# Patient Record
Sex: Male | Born: 1960
Health system: Southern US, Community
[De-identification: ages and names within clinical notes are randomized; demographics above are authoritative.]

## PROBLEM LIST (undated history)

## (undated) ENCOUNTER — Ambulatory Visit: Admission: EM | Payer: PRIVATE HEALTH INSURANCE | Source: Home / Self Care

## (undated) DIAGNOSIS — E7800 Pure hypercholesterolemia, unspecified: Secondary | ICD-10-CM

## (undated) DIAGNOSIS — E538 Deficiency of other specified B group vitamins: Principal | ICD-10-CM

## (undated) DIAGNOSIS — K219 Gastro-esophageal reflux disease without esophagitis: Secondary | ICD-10-CM

## (undated) DIAGNOSIS — H409 Unspecified glaucoma: Secondary | ICD-10-CM

## (undated) DIAGNOSIS — C801 Malignant (primary) neoplasm, unspecified: Secondary | ICD-10-CM

## (undated) DIAGNOSIS — K5790 Diverticulosis of intestine, part unspecified, without perforation or abscess without bleeding: Secondary | ICD-10-CM

## (undated) HISTORY — PX: COLONOSCOPY: SHX174

## (undated) HISTORY — DX: Diverticulosis of intestine, part unspecified, without perforation or abscess without bleeding: K57.90

## (undated) HISTORY — DX: Unspecified glaucoma: H40.9

## (undated) HISTORY — DX: Gastro-esophageal reflux disease without esophagitis: K21.9

## (undated) HISTORY — DX: Deficiency of other specified B group vitamins: E53.8

## (undated) HISTORY — PX: HERNIA REPAIR: SHX51

---

## 2004-03-14 ENCOUNTER — Ambulatory Visit: Payer: Self-pay | Admitting: Gastroenterology

## 2004-03-21 ENCOUNTER — Ambulatory Visit: Payer: Self-pay | Admitting: Internal Medicine

## 2004-04-20 ENCOUNTER — Ambulatory Visit: Payer: Self-pay | Admitting: Internal Medicine

## 2004-05-21 ENCOUNTER — Ambulatory Visit: Payer: Self-pay | Admitting: Internal Medicine

## 2004-06-21 ENCOUNTER — Ambulatory Visit: Payer: Self-pay | Admitting: Internal Medicine

## 2004-07-19 ENCOUNTER — Ambulatory Visit: Payer: Self-pay | Admitting: Internal Medicine

## 2004-08-19 ENCOUNTER — Ambulatory Visit: Payer: Self-pay | Admitting: Internal Medicine

## 2005-04-01 ENCOUNTER — Emergency Department: Payer: Self-pay | Admitting: Emergency Medicine

## 2005-04-11 ENCOUNTER — Ambulatory Visit: Payer: Self-pay | Admitting: Internal Medicine

## 2005-04-20 ENCOUNTER — Ambulatory Visit: Payer: Self-pay | Admitting: Internal Medicine

## 2006-03-27 ENCOUNTER — Other Ambulatory Visit: Payer: Self-pay

## 2006-03-27 ENCOUNTER — Emergency Department: Payer: Self-pay | Admitting: Emergency Medicine

## 2006-06-10 ENCOUNTER — Ambulatory Visit: Payer: Self-pay | Admitting: Internal Medicine

## 2006-06-21 ENCOUNTER — Ambulatory Visit: Payer: Self-pay | Admitting: Internal Medicine

## 2007-01-03 ENCOUNTER — Ambulatory Visit: Payer: Self-pay | Admitting: Internal Medicine

## 2007-01-20 ENCOUNTER — Ambulatory Visit: Payer: Self-pay | Admitting: Internal Medicine

## 2008-08-12 ENCOUNTER — Emergency Department: Payer: Self-pay | Admitting: Internal Medicine

## 2008-09-18 ENCOUNTER — Ambulatory Visit: Payer: Self-pay | Admitting: Internal Medicine

## 2008-10-12 ENCOUNTER — Ambulatory Visit: Payer: Self-pay | Admitting: Internal Medicine

## 2008-10-19 ENCOUNTER — Ambulatory Visit: Payer: Self-pay | Admitting: Internal Medicine

## 2009-11-22 ENCOUNTER — Ambulatory Visit: Payer: Self-pay | Admitting: Internal Medicine

## 2010-10-08 ENCOUNTER — Observation Stay: Payer: Self-pay | Admitting: Internal Medicine

## 2012-08-04 ENCOUNTER — Emergency Department: Payer: Self-pay | Admitting: Emergency Medicine

## 2012-08-04 LAB — URINALYSIS, COMPLETE
Bilirubin,UR: NEGATIVE
Blood: NEGATIVE
Glucose,UR: NEGATIVE mg/dL (ref 0–75)
Ketone: NEGATIVE
Leukocyte Esterase: NEGATIVE
Nitrite: NEGATIVE
Protein: NEGATIVE
RBC,UR: 1 /HPF (ref 0–5)
Specific Gravity: 1.045 (ref 1.003–1.030)
Squamous Epithelial: <1

## 2012-08-04 LAB — COMPREHENSIVE METABOLIC PANEL
Albumin: 4.2 g/dL (ref 3.4–5.0)
BUN: 12 mg/dL (ref 7–18)
Bilirubin,Total: 0.4 mg/dL (ref 0.2–1.0)
Calcium, Total: 8.9 mg/dL (ref 8.5–10.1)
Co2: 32 mmol/L (ref 21–32)

## 2012-08-04 LAB — CBC
HCT: 51.1 % (ref 40.0–52.0)
HGB: 17.4 g/dL (ref 13.0–18.0)
MCH: 30 pg (ref 26.0–34.0)
MCHC: 34 g/dL (ref 32.0–36.0)
Platelet: 82 10*3/uL — ABNORMAL LOW (ref 150–440)
RBC: 5.79 10*6/uL (ref 4.40–5.90)
WBC: 9.5 10*3/uL (ref 3.8–10.6)

## 2012-08-04 LAB — LIPASE, BLOOD: Lipase: 227 U/L (ref 73–393)

## 2013-01-19 ENCOUNTER — Ambulatory Visit: Payer: Self-pay

## 2013-04-23 ENCOUNTER — Observation Stay: Payer: Self-pay | Admitting: Family Medicine

## 2013-04-23 LAB — URINALYSIS, COMPLETE
Bilirubin,UR: NEGATIVE
Hyaline Cast: 25
Ketone: NEGATIVE
Leukocyte Esterase: NEGATIVE
Nitrite: NEGATIVE
Protein: 100
RBC,UR: 10 /HPF (ref 0–5)
Squamous Epithelial: 1

## 2013-04-23 LAB — CBC WITH DIFFERENTIAL/PLATELET
Eosinophil #: 0.1 10*3/uL (ref 0.0–0.7)
Eosinophil %: 0.9 %
HCT: 52.1 % — ABNORMAL HIGH (ref 40.0–52.0)
HGB: 17.4 g/dL (ref 13.0–18.0)
MCH: 29.6 pg (ref 26.0–34.0)
MCHC: 33.5 g/dL (ref 32.0–36.0)
MCV: 88 fL (ref 80–100)
Monocyte %: 6.6 %
Neutrophil #: 6.6 10*3/uL — ABNORMAL HIGH (ref 1.4–6.5)
Platelet: 98 10*3/uL — ABNORMAL LOW (ref 150–440)
RDW: 15.3 % — ABNORMAL HIGH (ref 11.5–14.5)
WBC: 8.7 10*3/uL (ref 3.8–10.6)

## 2013-04-23 LAB — COMPREHENSIVE METABOLIC PANEL
Alkaline Phosphatase: 120 U/L — ABNORMAL HIGH
BUN: 28 mg/dL — ABNORMAL HIGH (ref 7–18)
Creatinine: 1.32 mg/dL — ABNORMAL HIGH (ref 0.60–1.30)
EGFR (African American): >60
EGFR (Non-African Amer.): >60
Glucose: 121 mg/dL — ABNORMAL HIGH (ref 65–99)
Osmolality: 275 (ref 275–301)
SGOT(AST): 48 U/L — ABNORMAL HIGH (ref 15–37)
SGPT (ALT): 48 U/L (ref 12–78)
Total Protein: 8.8 g/dL — ABNORMAL HIGH (ref 6.4–8.2)

## 2013-04-23 LAB — MAGNESIUM: Magnesium: 2.1 mg/dL

## 2013-04-24 LAB — COMPREHENSIVE METABOLIC PANEL
Alkaline Phosphatase: 104 U/L
Anion Gap: 5 — ABNORMAL LOW (ref 7–16)
BUN: 14 mg/dL (ref 7–18)
Bilirubin,Total: 0.3 mg/dL (ref 0.2–1.0)
EGFR (Non-African Amer.): >60
Osmolality: 281 (ref 275–301)

## 2013-04-25 LAB — PLATELET COUNT: Platelet: 64 10*3/uL — ABNORMAL LOW (ref 150–440)

## 2013-04-25 LAB — WBCS, STOOL

## 2013-04-26 LAB — STOOL CULTURE

## 2013-06-08 ENCOUNTER — Ambulatory Visit: Payer: Self-pay | Admitting: Gastroenterology

## 2013-06-10 LAB — PATHOLOGY REPORT

## 2014-09-10 NOTE — Discharge Summary (Signed)
PATIENT NAME:  Donald Mendoza, Donald Mendoza MR#:  [id number] DATE OF BIRTH:  1960/08/25  DATE OF ADMISSION:  04/23/2013 DATE OF DISCHARGE:  04/25/2013  REASON FOR ADMISSION:  Significant diarrhea.   DISCHARGE DIAGNOSES: 1.  Acute kidney injury secondary to dehydration, intravascular volume depletion.  2.  Acute diarrhea secondary to viral gastroenteritis.  3.  History of idiopathic thrombocytopenia purpura, stable.  The patient has platelets at discharge of 60,000.  4.  Tobacco use disorder.  The patient has been counseled at discharge about his tobacco use and he understands that he needs to quit.  Tobacco cessation counseling given for at least four minutes.  5.  The patient has been admitted with a history of diarrhea.  Apparently he has a sick contact of several members of family having diarrhea.  His diarrhea started two days prior to the admission, numerous watery stool episodes, sometimes too high to count.  He has associated abdominal pain and cramping of the left lower quadrant without any radiation.  The patient had no significant relief for what he went to see his primary care physician.  At the primary care physician's office the patient was significantly orthostatic with persistent symptoms for what he was sent to the ER.  In the ER, his blood pressure was 114/81.  He was tachycardic at 106 with a temperature of 98.3.  He looks dehydrated and his creatinine was 1.32, which is high from his baseline.   The patient was admitted with the following diagnoses:   1.  Acute kidney injury.  The patient had acute kidney injury due to intravascular volume depletion.  He was put on IV fluids and he had a significant recovery and reversal of the kidney injury back to baseline.  This is likely again secondary to his diarrhea and significant dehydration.  2.  As far as his diarrhea, the patient had positive sick contacts, likely viral.  He had a CT scan.  The CT scan did not show any major abnormalities other than  bowel distention that could be just due to fluid from the diarrhea.  He had multiple testing done including C. diff. which was negative.  There was no red blood cells in the stool.  There was no white blood cells in the stool.  Stool culture showed no pathogenic E. coli.  No Helicobacter.  His urinalysis had 11 white blood cells, 10 red blood cells, but the patient was mostly symptomatic, for what he was just treated symptomatically for his diarrhea.    The patient did not have any significant fever.  At discharge he is discharged on pravastatin 40 mg once a day, loperamide 2 mg 4 times a day and he had a CBC and a BMP scheduled to be done and sent to Dr. Ma Hillock.    Overall, the patient had a good recovery.  The only concern was that at discharge his platelets were a little bit low.  We expect with viral infections to have a little bit more consumption of platelets.  At this moment we are at 64,000 and initial were 98,000.  The fact that admission were 98,000 could be due to hemoconcentration since his hemoglobin was 17 and his hematocrit was 52, so very likely the patient was just really hemoconcentrated.  We recommended to get a BMP in the next couple of days and a CBC in the next couple of days and those results are going to be sent to Dr. Ma Hillock since he has seen before due to idiopathic  thrombocytopenia purpura.   There is no signs of bleeding.  His idiopathic thrombocytopenia purpura has been stable.   I spent about 45 minutes with this discharge.      ____________________________ Sharp Sink, MD rsg:ea D: 04/26/2013 06:50:19 ET T: 04/26/2013 07:12:54 ET JOB#: [id number]  cc: Lindsay Sink, MD, <Dictator> Sandeep R. Ma Hillock, MD [place], NP Roselie Awkward America Brown MD ELECTRONICALLY SIGNED 04/30/2013 18:10

## 2014-09-10 NOTE — H&P (Signed)
PATIENT NAME:  Donald Mendoza, Donald Mendoza MR#:  [id number] DATE OF BIRTH:  1961/03/31  DATE OF ADMISSION:  04/23/2013  REFERRING PHYSICIAN: Dr. Jasmine December.   PRIMARY CARE PHYSICIAN: Cinda Quest, NP.   CHIEF COMPLAINT: Diarrhea.   HISTORY OF PRESENT ILLNESS: This is a 54 year old African American gentleman with past medical history of hyperlipidemia who is presenting with diarrhea. States that he has been having diarrhea for 2 days in duration. Describes this as numerous watery stools too high to count with associated abdominal cramping located in the left lower quadrant without radiation, 3 to 4 out of 10 in intensity. No worsening or relieving factors. Denies any associated nausea or vomiting; however, positive for decreased p.o. intake. He states that he has also been having intermittent bilateral leg cramping sensation which he finds relief with walking. He was seen by his PCP. Found to be orthostatic with vital signs. Given persistent symptoms, presented to [facility] for further workup and evaluation. Currently, he is without complaints.   REVIEW OF SYSTEMS:   CONSTITUTIONAL: Positive for subjective fevers, chills, generalized weakness and fatigue.  EYES: Denies blurred vision, double vision, eye pain.  EARS, NOSE, THROAT: Denies tinnitus, ear pain, hearing loss.  RESPIRATORY: Denies cough, wheeze, shortness of breath.  CARDIOVASCULAR: Denies chest pain, palpitations, edema.  GASTROINTESTINAL: Positive for diarrhea and abdominal pain as described above. Denies any nausea or vomiting.  GENITOURINARY: Denies dysuria, hematuria.  ENDOCRINE: Denies any nocturia or polyuria.  HEMATOLOGIC AND LYMPHATIC: Denies any bruising or bleeding.  SKIN: Denies rashes or lesions.  MUSCULOSKELETAL: Denies pain in neck, back, shoulder, knees, hips, any arthritic symptoms.  NEUROLOGIC: Denies any paralysis, paresthesias.  PSYCHIATRIC: Denies any anxiety or depressive symptoms.   Otherwise, full review of  systems performed by me is negative.   PAST MEDICAL HISTORY: ITP and hyperlipidemia.   SOCIAL HISTORY: Positive for tobacco use. Denies any alcohol or drug usage.   FAMILY HISTORY: Positive for hypertension and diabetes.   ALLERGIES: No known drug allergies.   HOME MEDICATIONS: Include pravastatin 40 mg p.o. at bedtime.   PHYSICAL EXAMINATION:  VITAL SIGNS: Temperature 98.3, heart rate 106, respirations 22, blood pressure 114/81, saturating 98% on room air. Weight 74.8 kg, BMI 25.9.  GENERAL: Well-nourished, well-developed, African American gentleman who is currently in no acute distress.  HEAD: Normocephalic, atraumatic.  EYES: Pupils equal, round and reactive to light. Extraocular muscles intact. No scleral icterus.  MOUTH: Dry mucosal membranes. Dentition intact. No abscess noted.  EARS, NOSE, THROAT: Throat clear without exudates. No external lesions.  NECK: Supple. No thyromegaly. No nodules. No JVD.  PULMONARY: Clear to auscultation bilaterally. No wheezes, rales or rhonchi. No use of accessory muscles. Good respiratory effort.  CHEST: Nontender to palpation.  CARDIOVASCULAR: S1, S2, regular rate and rhythm. No murmurs, rubs or gallops. No edema. Pedal pulses 2+ bilaterally.  GASTROINTESTINAL: Soft, nontender, nondistended. No masses. Hyperactive bowel sounds. No hepatosplenomegaly.  MUSCULOSKELETAL: No swelling, clubbing or edema. Range of motion full in all extremities.  NEUROLOGIC: Cranial nerves II through XII intact. No gross focal neurological deficits. Sensation intact. Reflexes intact.  SKIN: No ulcerations, lesions, rash or cyanosis. Skin warm and dry. Turgor is intact.  PSYCHIATRIC: Mood and affect within normal limits. The patient is awake, alert, oriented x 3. Insight and judgment are intact.   LABORATORY DATA: Sodium 134, potassium 4.6, chloride 107, bicarb 18, anion gap of 9, BUN 28, creatinine 1.32, glucose 121. LFTs: Total protein 8.8, alk phos 120, AST 48, ALT 48.  Remainder  of LFTs within normal limits. WBC 8.7, hemoglobin 17.4, platelets 98. Urinalysis negative for evidence of infection. CT abdomen and pelvis performed, revealing marked gaseous distention which was present on prior CTs but appears slightly worse, as well as small bowel distention slightly worse when compared to prior CTs; however, no focal transition point or site of obstruction is noted.   ASSESSMENT AND PLAN: A 54 year old [place] American gentleman with history of hyperlipidemia presenting with diarrhea. Found to have acute kidney injury.  1. Acute kidney injury: Likely prerenal in nature given his history of gastrointestinal losses. Will provide intravenous fluid hydration and follow his renal function.  2. Diarrhea: Denies history of positive sick contacts and lack of CT findings. Clostridium difficile has been sent. Will check fecal leukocytes. If those are normal, can start with Imodium for symptomatic treatment.  3. Hyponatremia at 134: Provide intravenous fluid hydration with normal saline and follow sodium levels.  4. Elevated LFTs with no clear etiology: Will follow trend. CT was within normal limits.  5. Metabolic acidosis without an elevated anion gap: Likely secondary to diarrhea. Provide intravenous fluid hydration and follow trends.  6. Venous thromboembolism prophylaxis with heparin subcutaneous.   The patient is FULL CODE.   TIME SPENT: 45 minutes.   ____________________________ Aaron Mose. Hower, MD dkh:gb D: 04/23/2013 22:32:12 ET T: 04/23/2013 22:45:00 ET JOB#: [id number]  cc: Aaron Mose. Hower, MD, <Dictator> DAVID Woodfin Ganja MD ELECTRONICALLY SIGNED 04/24/2013 2:39

## 2015-04-15 ENCOUNTER — Emergency Department: Payer: BLUE CROSS/BLUE SHIELD

## 2015-04-15 ENCOUNTER — Encounter: Payer: Self-pay | Admitting: Emergency Medicine

## 2015-04-15 ENCOUNTER — Emergency Department
Admission: EM | Admit: 2015-04-15 | Discharge: 2015-04-15 | Disposition: A | Discharge status: Home / Self Care | Payer: BLUE CROSS/BLUE SHIELD | Attending: Emergency Medicine | Admitting: Emergency Medicine

## 2015-04-15 DIAGNOSIS — Y9289 Other specified places as the place of occurrence of the external cause: Secondary | ICD-10-CM | POA: Diagnosis not present

## 2015-04-15 DIAGNOSIS — Y9389 Activity, other specified: Secondary | ICD-10-CM | POA: Diagnosis not present

## 2015-04-15 DIAGNOSIS — X58XXXA Exposure to other specified factors, initial encounter: Secondary | ICD-10-CM | POA: Insufficient documentation

## 2015-04-15 DIAGNOSIS — R1110 Vomiting, unspecified: Secondary | ICD-10-CM | POA: Diagnosis not present

## 2015-04-15 DIAGNOSIS — Y998 Other external cause status: Secondary | ICD-10-CM | POA: Insufficient documentation

## 2015-04-15 DIAGNOSIS — R109 Unspecified abdominal pain: Secondary | ICD-10-CM | POA: Diagnosis not present

## 2015-04-15 DIAGNOSIS — T188XXA Foreign body in other parts of alimentary tract, initial encounter: Secondary | ICD-10-CM | POA: Diagnosis present

## 2015-04-15 DIAGNOSIS — R079 Chest pain, unspecified: Secondary | ICD-10-CM | POA: Diagnosis not present

## 2015-04-15 HISTORY — DX: Pure hypercholesterolemia, unspecified: E78.00

## 2015-04-15 LAB — CBC
HEMATOCRIT: 49.5 % (ref 40.0–52.0)
HEMOGLOBIN: 16.2 g/dL (ref 13.0–18.0)
MCH: 29.3 pg (ref 26.0–34.0)
MCHC: 32.8 g/dL (ref 32.0–36.0)
MCV: 89.5 fL (ref 80.0–100.0)
Platelets: UNDETERMINED 10*3/uL (ref 150–440)
RBC: 5.53 MIL/uL (ref 4.40–5.90)
RDW: 14.8 % — AB (ref 11.5–14.5)
WBC: 10.4 10*3/uL (ref 3.8–10.6)

## 2015-04-15 LAB — BASIC METABOLIC PANEL
ANION GAP: 5 (ref 5–15)
BUN: 10 mg/dL (ref 6–20)
CHLORIDE: 107 mmol/L (ref 101–111)
CO2: 28 mmol/L (ref 22–32)
Calcium: 9.4 mg/dL (ref 8.9–10.3)
Creatinine, Ser: 0.84 mg/dL (ref 0.61–1.24)
Glucose, Bld: 102 mg/dL — ABNORMAL HIGH (ref 65–99)
POTASSIUM: 3.9 mmol/L (ref 3.5–5.1)
SODIUM: 140 mmol/L (ref 135–145)

## 2015-04-15 LAB — TROPONIN I: Troponin I: <0.03 ng/mL (ref <0.031)

## 2015-04-15 MED ORDER — GLUCAGON HCL (RDNA) 1 MG IJ SOLR
1.0000 mg | Freq: Once | INTRAMUSCULAR | Status: AC
  Administered 2015-04-15: 1 mg via INTRAMUSCULAR
  Filled 2015-04-15 (×2): qty 1

## 2015-04-15 MED ORDER — PANTOPRAZOLE SODIUM 40 MG PO TBEC: 40.0000 mg | DELAYED_RELEASE_TABLET | Freq: Every day | ORAL | Status: DC

## 2015-04-15 MED ORDER — GI COCKTAIL ~~LOC~~
30.0000 mL | Freq: Once | ORAL | Status: AC
  Administered 2015-04-15: 30 mL via ORAL
  Filled 2015-04-15: qty 30

## 2015-04-15 MED ORDER — SUCRALFATE 1 G PO TABS: 1.0000 g | ORAL_TABLET | Freq: Four times a day (QID) | ORAL | Status: DC

## 2015-04-15 NOTE — ED Notes (Signed)
Patient transported to X-ray 

## 2015-04-15 NOTE — ED Provider Notes (Signed)
[facility] Emergency Department Provider Note   ____________________________________________  Time seen: 1010  I have reviewed the triage vital signs and the nursing notes.   HISTORY  Chief Complaint Swallowed Foreign Body   History limited by: Not Limited   HPI Donald Mendoza is a 54 y.o. male who presents to the emergency department with concerns for chest pain. The patient states that it started yesterday when he had a piece of food caught in his throat. He states that for a couple hours last night he was not able to pass food or liquid through throat. He then had some vomiting which she thinks dislodge the piece of food. Since that time he has been able to swallow liquids. He still continues to have pain. He did not have any shortness of breath. Denies any cough. Denies any fevers. Denies any post with his esophagus or throat in the past.  Past Medical History  Diagnosis Date  . Hypercholesteremia     There are no active problems to display for this patient.   No past surgical history on file.  No current outpatient prescriptions on file.  Allergies Sulfa antibiotics  No family history on file.  Social History Social History  Substance Use Topics  . Smoking status: None  . Smokeless tobacco: None  . Alcohol Use: None    Review of Systems  Constitutional: Negative for fever. Cardiovascular: Positive for chest pain Respiratory: Negative for shortness of breath. Gastrointestinal: Negative for abdominal pain, vomiting and diarrhea. Genitourinary: Negative for dysuria. Musculoskeletal: Negative for back pain. Skin: Negative for rash. Neurological: Negative for headaches, focal weakness or numbness.  10-point ROS otherwise negative.  ____________________________________________   PHYSICAL EXAM:  VITAL SIGNS: ED Triage Vitals  Enc Vitals Group     BP 04/15/15 0944 114/73 mmHg     Pulse Rate 04/15/15 0944 78     Resp 04/15/15  0944 16     Temp 04/15/15 0944 97.9 F (36.6 C)     Temp Source 04/15/15 0944 Oral     SpO2 04/15/15 0944 100 %     Weight --      Height --      Head Cir --      Peak Flow --      Pain Score 04/15/15 0940 5   Constitutional: Alert and oriented. Well appearing and in no distress. Eyes: Conjunctivae are normal. PERRL. Normal extraocular movements. ENT   Head: Normocephalic and atraumatic.   Nose: No congestion/rhinnorhea.   Mouth/Throat: Mucous membranes are moist.   Neck: No stridor. Hematological/Lymphatic/Immunilogical: No cervical lymphadenopathy. Cardiovascular: Normal rate, regular rhythm.  No murmurs, rubs, or gallops. Respiratory: Normal respiratory effort without tachypnea nor retractions. Breath sounds are clear and equal bilaterally. No wheezes/rales/rhonchi. Gastrointestinal: Soft and nontender. No distention.  Genitourinary: Deferred Musculoskeletal: Normal range of motion in all extremities. No joint effusions.   Neurologic:  Normal speech and language. No gross focal neurologic deficits are appreciated.  Skin:  Skin is warm, dry and intact. No rash noted. Psychiatric: Mood and affect are normal. Speech and behavior are normal. Patient exhibits appropriate insight and judgment.  ____________________________________________    LABS (pertinent positives/negatives)  Labs Reviewed  BASIC METABOLIC PANEL - Abnormal; Notable for the following:    Glucose, Bld 102 (*)    All other components within normal limits  CBC - Abnormal; Notable for the following:    RDW 14.8 (*)    All other components within normal limits  TROPONIN I  ____________________________________________   EKG  Apolonio Schneiders, attending physician, personally viewed and interpreted this EKG  EKG Time: 0933 Rate: 75 Rhythm: NSR Axis: left axis deviation Intervals: qtc 428 QRS: narrow, q waves V1, V2 ST changes: no st elevation Impression: abnormal  ekg ____________________________________________    RADIOLOGY  CXR  IMPRESSION: No active cardiopulmonary disease.  ____________________________________________   PROCEDURES  Procedure(s) performed: None  Critical Care performed: No  ____________________________________________   INITIAL IMPRESSION / ASSESSMENT AND PLAN / ED COURSE  Pertinent labs & imaging results that were available during my care of the patient were reviewed by me and considered in my medical decision making (see chart for details).  Patient presented to the emergency department today with continued chest pain after getting a piece of food stuck in his throat for a number of hours last night. On exam patient here appears well. Physical exam is benign. Did write for patient to get a GI cocktail which improved the patient's pain. Patient stated he felt better at time of discharge. I instructed patient to follow-up with GI if pain got worse.  ____________________________________________   FINAL CLINICAL IMPRESSION(S) / ED DIAGNOSES  Final diagnoses:  Abdominal pain, unspecified abdominal location     Nance Pear, MD 04/15/15 [phone]

## 2015-04-15 NOTE — ED Notes (Signed)
Says he was eating yesterday and food got stuck.  Says he was unable to keep water down after , and it came back each time.  Has been up all night having pain in chest--spasms.  Says he tried a little water this am and it went down, but the pain in chest spasms keep coming and going.

## 2015-04-15 NOTE — Discharge Instructions (Signed)
Please seek medical attention for any high fevers, chest pain, shortness of breath, change in behavior, persistent vomiting, bloody stool or any other new or concerning symptoms. ° ° °Abdominal Pain, Adult °Many things can cause abdominal pain. Usually, abdominal pain is not caused by a disease and will improve without treatment. It can often be observed and treated at home. Your health care provider will do a physical exam and possibly order blood tests and X-rays to help determine the seriousness of your pain. However, in many cases, more time must pass before a clear cause of the pain can be found. Before that point, your health care provider may not know if you need more testing or further treatment. °HOME CARE INSTRUCTIONS °Monitor your abdominal pain for any changes. The following actions may help to alleviate any discomfort you are experiencing: °· Only take over-the-counter or prescription medicines as directed by your health care provider. °· Do not take laxatives unless directed to do so by your health care provider. °· Try a clear liquid diet (broth, tea, or water) as directed by your health care provider. Slowly move to a bland diet as tolerated. °SEEK MEDICAL CARE IF: °· You have unexplained abdominal pain. °· You have abdominal pain associated with nausea or diarrhea. °· You have pain when you urinate or have a bowel movement. °· You experience abdominal pain that wakes you in the night. °· You have abdominal pain that is worsened or improved by eating food. °· You have abdominal pain that is worsened with eating fatty foods. °· You have a fever. °SEEK IMMEDIATE MEDICAL CARE IF: °· Your pain does not go away within 2 hours. °· You keep throwing up (vomiting). °· Your pain is felt only in portions of the abdomen, such as the right side or the left lower portion of the abdomen. °· You pass bloody or black tarry stools. °MAKE SURE YOU: °· Understand these instructions. °· Will watch your  condition. °· Will get help right away if you are not doing well or get worse. °  °This information is not intended to replace advice given to you by your health care provider. Make sure you discuss any questions you have with your health care provider. °  °Document Released: 02/14/2005 Document Revised: 01/26/2015 Document Reviewed: 01/14/2013 °Elsevier Interactive Patient Education ©2016 Elsevier Inc[employer] ° °

## 2015-09-05 ENCOUNTER — Other Ambulatory Visit: Payer: Self-pay | Admitting: Physician Assistant

## 2015-09-05 DIAGNOSIS — Z125 Encounter for screening for malignant neoplasm of prostate: Secondary | ICD-10-CM | POA: Diagnosis not present

## 2015-09-05 DIAGNOSIS — R1319 Other dysphagia: Secondary | ICD-10-CM

## 2015-09-05 DIAGNOSIS — R1310 Dysphagia, unspecified: Secondary | ICD-10-CM

## 2015-09-05 DIAGNOSIS — E118 Type 2 diabetes mellitus with unspecified complications: Secondary | ICD-10-CM | POA: Diagnosis not present

## 2015-09-05 DIAGNOSIS — Z0001 Encounter for general adult medical examination with abnormal findings: Secondary | ICD-10-CM | POA: Diagnosis not present

## 2015-09-05 DIAGNOSIS — D696 Thrombocytopenia, unspecified: Secondary | ICD-10-CM | POA: Diagnosis not present

## 2015-09-05 DIAGNOSIS — R1314 Dysphagia, pharyngoesophageal phase: Secondary | ICD-10-CM | POA: Diagnosis not present

## 2015-09-13 ENCOUNTER — Ambulatory Visit: Payer: BLUE CROSS/BLUE SHIELD | Attending: Physician Assistant

## 2015-09-13 ENCOUNTER — Other Ambulatory Visit: Payer: BLUE CROSS/BLUE SHIELD

## 2015-09-22 DIAGNOSIS — R1310 Dysphagia, unspecified: Secondary | ICD-10-CM | POA: Diagnosis not present

## 2015-09-22 DIAGNOSIS — K219 Gastro-esophageal reflux disease without esophagitis: Secondary | ICD-10-CM | POA: Diagnosis not present

## 2015-10-18 DIAGNOSIS — K2960 Other gastritis without bleeding: Secondary | ICD-10-CM | POA: Diagnosis not present

## 2015-10-18 DIAGNOSIS — K2990 Gastroduodenitis, unspecified, without bleeding: Secondary | ICD-10-CM | POA: Diagnosis not present

## 2015-10-18 DIAGNOSIS — R1310 Dysphagia, unspecified: Secondary | ICD-10-CM | POA: Diagnosis not present

## 2015-10-18 DIAGNOSIS — K2970 Gastritis, unspecified, without bleeding: Secondary | ICD-10-CM | POA: Diagnosis not present

## 2015-10-18 DIAGNOSIS — K222 Esophageal obstruction: Secondary | ICD-10-CM | POA: Diagnosis not present

## 2016-01-04 DIAGNOSIS — H401123 Primary open-angle glaucoma, left eye, severe stage: Secondary | ICD-10-CM | POA: Diagnosis not present

## 2016-01-08 DIAGNOSIS — Z23 Encounter for immunization: Secondary | ICD-10-CM | POA: Diagnosis not present

## 2016-02-15 ENCOUNTER — Encounter: Payer: Self-pay | Admitting: General Surgery

## 2016-02-15 DIAGNOSIS — K429 Umbilical hernia without obstruction or gangrene: Secondary | ICD-10-CM | POA: Diagnosis not present

## 2016-02-15 DIAGNOSIS — K5752 Diverticulitis of both small and large intestine without perforation or abscess without bleeding: Secondary | ICD-10-CM | POA: Diagnosis not present

## 2016-02-27 ENCOUNTER — Encounter: Payer: Self-pay | Admitting: General Surgery

## 2016-02-27 ENCOUNTER — Ambulatory Visit (INDEPENDENT_AMBULATORY_CARE_PROVIDER_SITE_OTHER): Payer: BLUE CROSS/BLUE SHIELD | Admitting: General Surgery

## 2016-02-27 VITALS — BP 140/76 | HR 76 | Resp 12 | Ht 67.0 in | Wt 171.0 lb

## 2016-02-27 DIAGNOSIS — K429 Umbilical hernia without obstruction or gangrene: Secondary | ICD-10-CM

## 2016-02-27 DIAGNOSIS — R1033 Periumbilical pain: Secondary | ICD-10-CM | POA: Diagnosis not present

## 2016-02-27 NOTE — Progress Notes (Signed)
Patient ID: Donald Mendoza, male   DOB: 02-Jan-1961, 55 y.o.   MRN: [id number]  Chief Complaint  Patient presents with  . Other    hernia    HPI Donald Mendoza is a 55 y.o. male here today for an evaluation a hernia. He states he first noticed this about 3 months ago. Reports lifting something at work then developing severe right-sided abdominal pain. He was evaluated for this, but no imaging was completed. It does bulge in and out. He states he was had this hernia repaired 10 years ago at [facility], he does not remember who did the surgery.   I have reviewed the history of present illness with the patient.  HPI  Past Medical History:  Diagnosis Date  . GERD (gastroesophageal reflux disease)   . Hypercholesteremia     Past Surgical History:  Procedure Laterality Date  . HERNIA REPAIR     10 years ago, [facility]    Family History  Problem Relation Age of Onset  . Cancer Mother     Breast  . Diabetes Mother   . Cancer Sister     Breast     Social History Social History  Substance Use Topics  . Smoking status: Current Every Day Smoker    Packs/day: 0.50    Years: 10.00    Types: Cigarettes  . Smokeless tobacco: Never Used  . Alcohol use Yes    Allergies  Allergen Reactions  . Sulfa Antibiotics     Current Outpatient Prescriptions  Medication Sig Dispense Refill  . latanoprost (XALATAN) 0.005 % ophthalmic solution   0  . omeprazole (PRILOSEC) 40 MG capsule   0  . pantoprazole (PROTONIX) 40 MG tablet Take 1 tablet (40 mg total) by mouth daily. 30 tablet 1   No current facility-administered medications for this visit.     Review of Systems Review of Systems  Constitutional: Negative.   Respiratory: Negative.   Cardiovascular: Negative.   Gastrointestinal: Positive for abdominal pain. Negative for abdominal distention, blood in stool, constipation, diarrhea, nausea and vomiting.    Blood pressure 140/76, pulse 76, resp. rate 12, height 5' 7"  (1.702 m), weight 171  lb (77.6 kg).  Physical Exam Physical Exam  Constitutional: He is oriented to person, place, and time. He appears well-developed and well-nourished.  Eyes: Conjunctivae are normal. No scleral icterus.  Neck: Neck supple.  Cardiovascular: Normal rate, regular rhythm and normal heart sounds.   Pulmonary/Chest: Effort normal and breath sounds normal.  Abdominal: Soft. Bowel sounds are normal. He exhibits no distension and no mass. There is no tenderness.  No palpable hernia  Neurological: He is alert and oriented to person, place, and time.  Skin: Skin is warm and dry.    Data Reviewed Notes  Assessment    No palpable hernia on exam, however patient reports on and off severe pain where he states prior hernia repair was done.    Plan    Obtain CT scan abdomen to assess for hernia or other explanations for the patient's pain.  The patient is scheduled for a CT abdomen and pelvis on 03/02/16 at 3:30 pm. He will arrive by 3:15 pm and have nothing to eat or drink for 4 hours prior. He will pick up a prep kit today. The patient is aware of date, time, and instructions. He will follow up here on 03/06/16 at 4:15 pm.  This information has been scribed by Verlene Mayer, CMA  Jessicia Napolitano G 02/27/2016, 4:06 PM

## 2016-02-27 NOTE — Patient Instructions (Addendum)
Follow up after CT scan abdomen.  The patient is scheduled for a CT abdomen and pelvis on 03/02/16 at 3:30 pm. He will arrive by 3:15 pm and have nothing to eat or drink for 4 hours prior. He will pick up a prep kit today. The patient is aware of date, time, and instructions. He will follow up here on 03/06/16 at 4:15 pm.

## 2016-03-02 ENCOUNTER — Ambulatory Visit
Admission: RE | Admit: 2016-03-02 | Discharge: 2016-03-02 | Disposition: A | Discharge status: Home / Self Care | Payer: BLUE CROSS/BLUE SHIELD | Source: Ambulatory Visit | Attending: General Surgery | Admitting: General Surgery

## 2016-03-02 DIAGNOSIS — K4090 Unilateral inguinal hernia, without obstruction or gangrene, not specified as recurrent: Secondary | ICD-10-CM | POA: Diagnosis not present

## 2016-03-02 DIAGNOSIS — K5730 Diverticulosis of large intestine without perforation or abscess without bleeding: Secondary | ICD-10-CM | POA: Insufficient documentation

## 2016-03-02 DIAGNOSIS — I2510 Atherosclerotic heart disease of native coronary artery without angina pectoris: Secondary | ICD-10-CM | POA: Insufficient documentation

## 2016-03-02 DIAGNOSIS — K429 Umbilical hernia without obstruction or gangrene: Secondary | ICD-10-CM

## 2016-03-02 DIAGNOSIS — K760 Fatty (change of) liver, not elsewhere classified: Secondary | ICD-10-CM | POA: Insufficient documentation

## 2016-03-02 MED ORDER — IOPAMIDOL (ISOVUE-300) INJECTION 61%
100.0000 mL | Freq: Once | INTRAVENOUS | Status: AC | PRN
  Administered 2016-03-02: 100 mL via INTRAVENOUS

## 2016-03-06 ENCOUNTER — Encounter: Payer: Self-pay | Admitting: General Surgery

## 2016-03-06 ENCOUNTER — Ambulatory Visit (INDEPENDENT_AMBULATORY_CARE_PROVIDER_SITE_OTHER): Payer: BLUE CROSS/BLUE SHIELD | Admitting: General Surgery

## 2016-03-06 VITALS — BP 120/80 | HR 74 | Resp 12 | Ht 67.0 in | Wt 170.0 lb

## 2016-03-06 DIAGNOSIS — K5790 Diverticulosis of intestine, part unspecified, without perforation or abscess without bleeding: Secondary | ICD-10-CM | POA: Diagnosis not present

## 2016-03-06 DIAGNOSIS — R1033 Periumbilical pain: Secondary | ICD-10-CM | POA: Diagnosis not present

## 2016-03-06 NOTE — Patient Instructions (Addendum)
The patient is aware to call back for any questions or concerns.  Diverticulosis Diverticulosis is the condition that develops when small pouches (diverticula) form in the wall of your colon. Your colon, or large intestine, is where water is absorbed and stool is formed. The pouches form when the inside layer of your colon pushes through weak spots in the outer layers of your colon. CAUSES  No one knows exactly what causes diverticulosis. RISK FACTORS  Being older than 45. Your risk for this condition increases with age. Diverticulosis is rare in people younger than 40 years. By age 11, almost everyone has it.  Eating a low-fiber diet.  Being frequently constipated.  Being overweight.  Not getting enough exercise.  Smoking.  Taking over-the-counter pain medicines, like aspirin and ibuprofen. SYMPTOMS  Most people with diverticulosis do not have symptoms. DIAGNOSIS  Because diverticulosis often has no symptoms, health care providers often discover the condition during an exam for other colon problems. In many cases, a health care provider will diagnose diverticulosis while using a flexible scope to examine the colon (colonoscopy). TREATMENT  If you have never developed an infection related to diverticulosis, you may not need treatment. If you have had an infection before, treatment may include:  Eating more fruits, vegetables, and grains.  Taking a fiber supplement.  Taking a live bacteria supplement (probiotic).  Taking medicine to relax your colon. HOME CARE INSTRUCTIONS   Drink at least 6-8 glasses of water each day to prevent constipation.  Try not to strain when you have a bowel movement.  Keep all follow-up appointments. If you have had an infection before:  Increase the fiber in your diet as directed by your health care provider or dietitian.  Take a dietary fiber supplement if your health care provider approves.  Only take medicines as directed by your health  care provider. SEEK MEDICAL CARE IF:   You have abdominal pain.  You have bloating.  You have cramps.  You have not gone to the bathroom in 3 days. SEEK IMMEDIATE MEDICAL CARE IF:   Your pain gets worse.  Yourbloating becomes very bad.  You have a fever or chills, and your symptoms suddenly get worse.  You begin vomiting.  You have bowel movements that are bloody or black. MAKE SURE YOU:  Understand these instructions.  Will watch your condition.  Will get help right away if you are not doing well or get worse.   This information is not intended to replace advice given to you by your health care provider. Make sure you discuss any questions you have with your health care provider.   Document Released: 02/02/2004 Document Revised: 05/12/2013 Document Reviewed: 04/01/2013 Elsevier Interactive Patient Education [employer].

## 2016-03-06 NOTE — Progress Notes (Signed)
Patient ID: Donald Mendoza, male   DOB: 02-Oct-1960, 55 y.o.   MRN: [id number]  Chief Complaint  Patient presents with  . Follow-up    CT scan    HPI Donald Mendoza is a 55 y.o. male.  Here today for follow up CT scan. I have reviewed the history of present illness with the patient.  HPI  Past Medical History:  Diagnosis Date  . Diverticulosis   . GERD (gastroesophageal reflux disease)   . Hypercholesteremia     Past Surgical History:  Procedure Laterality Date  . COLONOSCOPY    . HERNIA REPAIR     10 years ago, [facility]    Family History  Problem Relation Age of Onset  . Cancer Mother     Breast  . Diabetes Mother   . Cancer Sister     Breast     Social History Social History  Substance Use Topics  . Smoking status: Current Every Day Smoker    Packs/day: 0.50    Years: 10.00    Types: Cigarettes  . Smokeless tobacco: Never Used  . Alcohol use Yes    Allergies  Allergen Reactions  . Sulfa Antibiotics     Current Outpatient Prescriptions  Medication Sig Dispense Refill  . latanoprost (XALATAN) 0.005 % ophthalmic solution   0  . pantoprazole (PROTONIX) 40 MG tablet Take 1 tablet (40 mg total) by mouth daily. 30 tablet 1  . pravastatin (PRAVACHOL) 40 MG tablet Take 40 mg by mouth daily.     No current facility-administered medications for this visit.     Review of Systems Review of Systems  Constitutional: Negative.   Respiratory: Negative.   Cardiovascular: Negative.     Blood pressure 120/80, pulse 74, resp. rate 12, height 5\' 7"  (1.702 m), weight 170 lb (77.1 kg).  Physical Exam Physical Exam  Constitutional: He is oriented to person, place, and time. He appears well-developed and well-nourished.  Abdominal: Soft. Normal appearance. There is no tenderness. No hernia. Hernia confirmed negative in the right inguinal area and confirmed negative in the left inguinal area.  Neurological: He is alert and oriented to person, place, and time.  Skin:  Skin is warm and dry.  Psychiatric: His behavior is normal.    Data Reviewed CT can- no hernias noted near umbilicus or in the groins He does have diverticulosis, no signs of diverticulitis Assessment   Diverticulosis No  Hernia seen. Pt advised fully.     Plan    Follow up as needed. The patient is aware to call back for any questions or concerns.      This information has been scribed by Karie Fetch RN, BSN,BC.   SANKAR,SEEPLAPUTHUR G 03/07/2016, 4:35 PM

## 2016-03-07 ENCOUNTER — Encounter: Payer: Self-pay | Admitting: General Surgery

## 2016-05-21 DIAGNOSIS — C801 Malignant (primary) neoplasm, unspecified: Secondary | ICD-10-CM

## 2016-05-21 HISTORY — DX: Malignant (primary) neoplasm, unspecified: C80.1

## 2016-06-01 DIAGNOSIS — N400 Enlarged prostate without lower urinary tract symptoms: Secondary | ICD-10-CM | POA: Diagnosis not present

## 2016-06-01 DIAGNOSIS — E782 Mixed hyperlipidemia: Secondary | ICD-10-CM | POA: Diagnosis not present

## 2016-06-01 DIAGNOSIS — E119 Type 2 diabetes mellitus without complications: Secondary | ICD-10-CM | POA: Diagnosis not present

## 2016-06-01 DIAGNOSIS — K219 Gastro-esophageal reflux disease without esophagitis: Secondary | ICD-10-CM | POA: Diagnosis not present

## 2016-07-18 ENCOUNTER — Ambulatory Visit (INDEPENDENT_AMBULATORY_CARE_PROVIDER_SITE_OTHER): Payer: BLUE CROSS/BLUE SHIELD | Admitting: Urology

## 2016-07-18 VITALS — BP 122/82 | HR 70 | Ht 67.0 in | Wt 166.7 lb

## 2016-07-18 DIAGNOSIS — R972 Elevated prostate specific antigen [PSA]: Secondary | ICD-10-CM | POA: Diagnosis not present

## 2016-07-18 DIAGNOSIS — R3911 Hesitancy of micturition: Secondary | ICD-10-CM | POA: Diagnosis not present

## 2016-07-18 LAB — URINALYSIS, COMPLETE
Bilirubin, UA: NEGATIVE
GLUCOSE, UA: NEGATIVE
KETONES UA: NEGATIVE
Leukocytes, UA: NEGATIVE
NITRITE UA: NEGATIVE
Protein, UA: NEGATIVE
RBC, UA: NEGATIVE
SPEC GRAV UA: 1.01 (ref 1.005–1.030)
UUROB: 0.2 mg/dL (ref 0.2–1.0)
pH, UA: 5.5 (ref 5.0–7.5)

## 2016-07-18 LAB — MICROSCOPIC EXAMINATION: Bacteria, UA: NONE SEEN

## 2016-07-18 MED ORDER — CIPROFLOXACIN HCL 500 MG PO TABS: 500.0000 mg | ORAL_TABLET | Freq: Two times a day (BID) | ORAL | 0 refills | Status: AC

## 2016-07-18 NOTE — Progress Notes (Signed)
07/18/2016 8:44 AM   Donald Mendoza Nov 14, 1960 [id number]  Referring provider: Ronnell Freshwater, NP [place], [place] [address]  CC: - rising PSA  HPI:  1 - Rising / Elevated PSA - No FHX prostate cancer 2014 - PSA 1.4 2017 -  PSA 3.8 2018 - PSA 4.0 / DRE 50gm smooth  2 - Urinary Hesitancy - slowly increasing bother from mostly obsructive sympotm with hesitancy and weak stream. DRE 50gm (large for age).   PMH sig for DM2 ([id number]),  Umbilical and groin hernia repair. No CV disease / blood thinners. His PCP is Clayborn Bigness MD.  Today " Donald Mendoza " is seen as new patient for above.    PMH: Past Medical History:  Diagnosis Date  . Diverticulosis   . GERD (gastroesophageal reflux disease)   . Hypercholesteremia     Surgical History: Past Surgical History:  Procedure Laterality Date  . COLONOSCOPY    . HERNIA REPAIR     10 years ago, [facility]    Home Medications:  Allergies as of 07/18/2016      Reactions   Sulfa Antibiotics       Medication List       Accurate as of 07/18/16  8:44 AM. Always use your most recent med list.          latanoprost 0.005 % ophthalmic solution Commonly known as:  XALATAN   pantoprazole 40 MG tablet Commonly known as:  PROTONIX Take 1 tablet (40 mg total) by mouth daily.   pravastatin 40 MG tablet Commonly known as:  PRAVACHOL Take 40 mg by mouth daily.       Allergies:  Allergies  Allergen Reactions  . Sulfa Antibiotics     Family History: Family History  Problem Relation Age of Onset  . Cancer Mother     Breast  . Diabetes Mother   . Cancer Sister     Breast     Social History:  reports that he has been smoking Cigarettes.  He has a 5.00 pack-year smoking history. He has never used smokeless tobacco. He reports that he drinks alcohol. He reports that he does not use drugs.  ROS: UROLOGY Frequent Urination?: No Hard to postpone urination?: No Burning/pain with urination?: No Get up at night to  urinate?: Yes Leakage of urine?: No Urine stream starts and stops?: No Trouble starting stream?: No Do you have to strain to urinate?: No Blood in urine?: No Urinary tract infection?: No Sexually transmitted disease?: No Injury to kidneys or bladder?: No Painful intercourse?: No Weak stream?: No Erection problems?: No Penile pain?: No  Gastrointestinal Nausea?: No Vomiting?: No Indigestion/heartburn?: No Diarrhea?: No Constipation?: No  Constitutional Fever: No Night sweats?: No Weight loss?: No Fatigue?: No  Skin Skin rash/lesions?: No Itching?: No  Eyes Blurred vision?: No Double vision?: No  Ears/Nose/Throat Sore throat?: No Sinus problems?: No  Hematologic/Lymphatic Swollen glands?: No Easy bruising?: No  Cardiovascular Leg swelling?: No Chest pain?: No  Respiratory Cough?: No Shortness of breath?: No  Endocrine Excessive thirst?: No  Musculoskeletal Back pain?: No Joint pain?: No  Neurological Headaches?: No Dizziness?: No  Psychologic Depression?: No Anxiety?: No  Physical Exam: There were no vitals taken for this visit.  Constitutional:  Alert and oriented, No acute distress. HEENT: [place] AT, moist mucus membranes.  Trachea midline, no masses. Cardiovascular: No clubbing, cyanosis, or edema. Respiratory: Normal respiratory effort, no increased work of breathing. GI: Abdomen is soft, nontender, nondistended, no abdominal masses GU:  No CVA tenderness.  DRE 50gm smooth.  Skin: No rashes, bruises or suspicious lesions. Lymph: No cervical or inguinal adenopathy. Neurologic: Grossly intact, no focal deficits, moving all 4 extremities. Psychiatric: Normal mood and affect.  Laboratory Data: Lab Results  Component Value Date   WBC 10.4 04/15/2015   HGB 16.2 04/15/2015   HCT 49.5 04/15/2015   MCV 89.5 04/15/2015   PLT PLATELET CLUMPS NOTED ON SMEAR, UNABLE TO ESTIMATE 04/15/2015    Lab Results  Component Value Date   CREATININE 0.84  04/15/2015    No results found for: PSA  No results found for: TESTOSTERONE  No results found for: HGBA1C  Urinalysis    Component Value Date/Time   COLORURINE Yellow 04/23/2013 1658   APPEARANCEUR Hazy 04/23/2013 1658   LABSPEC 1.026 04/23/2013 1658   PHURINE 5.0 04/23/2013 1658   GLUCOSEU Negative 04/23/2013 1658   HGBUR 1+ 04/23/2013 1658   BILIRUBINUR Negative 04/23/2013 1658   KETONESUR Negative 04/23/2013 1658   PROTEINUR 100 mg/dL 04/23/2013 1658   NITRITE Negative 04/23/2013 1658   LEUKOCYTESUR Negative 04/23/2013 1658    Pertinent Imaging: none  Assessment & Plan:   1 - Rising / Elevated PSA - absolute value and trend quite concenring for possible cancer. His comorbidity is well controlled andhe is younger man. Natural history of disease discussed and further management options of serial labs / exam, genetic tests, or BX discussed. Frankly rec BX given clinical picutre with goal of ruling out agressiv e/ high vollume cancer. Risks, benefits, peri-BX course discussed.  2 - Urinary Hesitancy - likely form enlarged prostae. BX as per above to r/o cancer, consider tamsulosin + finasteride if no carcinoma.  RTC next avail prostate biopsy.   Alexis Frock, [facility] [address] [place], [place] [address] [phone]

## 2016-08-10 ENCOUNTER — Other Ambulatory Visit: Payer: Self-pay | Admitting: Urology

## 2016-08-10 ENCOUNTER — Ambulatory Visit (INDEPENDENT_AMBULATORY_CARE_PROVIDER_SITE_OTHER): Payer: BLUE CROSS/BLUE SHIELD | Admitting: Urology

## 2016-08-10 VITALS — BP 119/69 | HR 94 | Ht 67.0 in | Wt 161.6 lb

## 2016-08-10 DIAGNOSIS — N4232 Atypical small acinar proliferation of prostate: Secondary | ICD-10-CM | POA: Diagnosis not present

## 2016-08-10 DIAGNOSIS — C61 Malignant neoplasm of prostate: Secondary | ICD-10-CM | POA: Diagnosis not present

## 2016-08-10 DIAGNOSIS — R972 Elevated prostate specific antigen [PSA]: Secondary | ICD-10-CM

## 2016-08-10 MED ORDER — GENTAMICIN SULFATE 40 MG/ML IJ SOLN: 80.0000 mg | Freq: Once | INTRAMUSCULAR | Status: DC

## 2016-08-10 MED ORDER — LIDOCAINE HCL 2 % EX GEL: 1.0000 "application " | Freq: Once | CUTANEOUS | Status: DC

## 2016-08-10 NOTE — Progress Notes (Signed)
Prostate Biopsy Procedure   Informed consent was obtained after discussing risks/benefits of the procedure.  A time out was performed to ensure correct patient identity.  Pre-Procedure: - Last PSA Level: 4.0 - Gentamicin given prophylactically - Levaquin 500 mg administered PO -Transrectal Ultrasound performed revealing a  gm prostate -No significant hypoechoic or median lobe noted  Procedure: - Prostate block performed using 10 cc 1% lidocaine and biopsies taken from sextant areas, a total of 12 under ultrasound guidance.  Post-Procedure: - Patient tolerated the procedure well - He was counseled to seek immediate medical attention if experiences any severe pain, significant bleeding, or fevers - Return in one week to discuss biopsy results

## 2016-08-20 LAB — PATHOLOGY REPORT

## 2016-08-21 ENCOUNTER — Other Ambulatory Visit: Payer: Self-pay | Admitting: Urology

## 2016-08-22 ENCOUNTER — Encounter: Payer: Self-pay | Admitting: Urology

## 2016-08-22 ENCOUNTER — Ambulatory Visit (INDEPENDENT_AMBULATORY_CARE_PROVIDER_SITE_OTHER): Payer: BLUE CROSS/BLUE SHIELD | Admitting: Urology

## 2016-08-22 VITALS — BP 115/77 | HR 73 | Ht 67.0 in | Wt 165.5 lb

## 2016-08-22 DIAGNOSIS — C61 Malignant neoplasm of prostate: Secondary | ICD-10-CM

## 2016-08-22 NOTE — Progress Notes (Signed)
08/22/2016 12:10 PM   Donald Mendoza [birth year] [id number]  Referring provider: Christie Nottingham, [place], [place] [address]  Chief Complaint  Patient presents with  . Elevated PSA    BX Results    HPI: 1 - Prostate cancer -  Gleason 3+3 = 6 in 16% of 1 of 12 cores at right lateral base (diagnosed March 2018). PSA at diagnosis: 4.0  PSA History: 2014 - PSA 1.4 2017 -  PSA 3.8 2018 - PSA 4.0 / DRE 50gm smooth - time of diagnosis   PMH: Past Medical History:  Diagnosis Date  . Diverticulosis   . GERD (gastroesophageal reflux disease)   . Hypercholesteremia     Surgical History: Past Surgical History:  Procedure Laterality Date  . COLONOSCOPY    . HERNIA REPAIR     10 years ago, [facility]    Home Medications:  Allergies as of 08/22/2016      Reactions   Sulfa Antibiotics       Medication List       Accurate as of 08/22/16 12:10 PM. Always use your most recent med list.          ciprofloxacin 500 MG tablet Commonly known as:  CIPRO Take 500 mg by mouth 2 (two) times daily.   latanoprost 0.005 % ophthalmic solution Commonly known as:  XALATAN   omeprazole 40 MG capsule Commonly known as:  PRILOSEC Take 40 mg by mouth daily.   pantoprazole 40 MG tablet Commonly known as:  PROTONIX Take 1 tablet (40 mg total) by mouth daily.   pravastatin 40 MG tablet Commonly known as:  PRAVACHOL Take 40 mg by mouth daily.       Allergies:  Allergies  Allergen Reactions  . Sulfa Antibiotics     Family History: Family History  Problem Relation Age of Onset  . Cancer Mother     Breast  . Diabetes Mother   . Cancer Sister     Breast     Social History:  reports that he has been smoking Cigarettes.  He has a 5.00 pack-year smoking history. He has never used smokeless tobacco. He reports that he drinks alcohol. He reports that he does not use drugs.  ROS: UROLOGY Frequent Urination?: No Hard to postpone urination?: No Burning/pain with  urination?: No Get up at night to urinate?: No Leakage of urine?: No Urine stream starts and stops?: No Trouble starting stream?: No Do you have to strain to urinate?: No Blood in urine?: No Urinary tract infection?: No Sexually transmitted disease?: No Injury to kidneys or bladder?: No Painful intercourse?: No Weak stream?: No Erection problems?: No Penile pain?: No  Gastrointestinal Nausea?: No Vomiting?: No Indigestion/heartburn?: No Diarrhea?: No Constipation?: No  Constitutional Fever: No Night sweats?: No Weight loss?: No Fatigue?: No  Skin Skin rash/lesions?: No Itching?: No  Eyes Blurred vision?: No Double vision?: No  Ears/Nose/Throat Sore throat?: No Sinus problems?: No  Hematologic/Lymphatic Swollen glands?: No Easy bruising?: No  Cardiovascular Leg swelling?: No Chest pain?: No  Respiratory Cough?: No Shortness of breath?: No  Endocrine Excessive thirst?: No  Musculoskeletal Back pain?: No Joint pain?: No  Neurological Headaches?: No Dizziness?: No  Psychologic Depression?: No Anxiety?: No  Physical Exam: BP 115/77 (BP Location: Left Arm, Patient Position: Sitting, Cuff Size: Normal)   Pulse 73   Ht 5\' 7"  (1.702 m)   Wt 165 lb 8 oz (75.1 kg)   BMI 25.92 kg/m   Constitutional:  Alert  and oriented, No acute distress. HEENT: [place] AT, moist mucus membranes.  Trachea midline, no masses. Cardiovascular: No clubbing, cyanosis, or edema. Respiratory: Normal respiratory effort, no increased work of breathing. GI: Abdomen is soft, nontender, nondistended, no abdominal masses GU: No CVA tenderness.  Skin: No rashes, bruises or suspicious lesions. Lymph: No cervical or inguinal adenopathy. Neurologic: Grossly intact, no focal deficits, moving all 4 extremities. Psychiatric: Normal mood and affect.  Laboratory Data: Lab Results  Component Value Date   WBC 10.4 04/15/2015   HGB 16.2 04/15/2015   HCT 49.5 04/15/2015   MCV 89.5  04/15/2015   PLT PLATELET CLUMPS NOTED ON SMEAR, UNABLE TO ESTIMATE 04/15/2015    Lab Results  Component Value Date   CREATININE 0.84 04/15/2015    No results found for: PSA  No results found for: TESTOSTERONE  No results found for: HGBA1C  Urinalysis    Component Value Date/Time   COLORURINE Yellow 04/23/2013 1658   APPEARANCEUR Clear 07/18/2016 1045   LABSPEC 1.026 04/23/2013 1658   PHURINE 5.0 04/23/2013 1658   GLUCOSEU Negative 07/18/2016 1045   GLUCOSEU Negative 04/23/2013 1658   HGBUR 1+ 04/23/2013 1658   BILIRUBINUR Negative 07/18/2016 1045   BILIRUBINUR Negative 04/23/2013 1658   KETONESUR Negative 04/23/2013 1658   PROTEINUR Negative 07/18/2016 1045   PROTEINUR 100 mg/dL 04/23/2013 1658   NITRITE Negative 07/18/2016 1045   NITRITE Negative 04/23/2013 1658   LEUKOCYTESUR Negative 07/18/2016 1045   LEUKOCYTESUR Negative 04/23/2013 1658     Assessment & Plan:    1. Very low risk prostate cancer I had a long discussion with the patient regarding his diagnosis of very low risk prostate cancer. We discussed prostate cancer and its natural history. We discussed treatment algorithms which included watchful waiting, active surveillance, prostatectomy, radiation therapy, and cryotherapy. I did discuss with him that the recommendation for patients with very low risk prostate cancer is active surveillance. We discussed the nature of active surveillance. He understands the goal is to monitor his disease and if it does worsen to then treated that time with the goal of cure. He does understand that so that the standard of care based on the American urological Association guidelines. He understands though does take active participation with a repeat biopsy in the near future. We also briefly discussed the other treatment modalities. He does understand that the treatment for his disease is often worse than the disease itself. He is agreeable to proceeding with active surveillance. He  will follow-up in 6 months for repeat prostate biopsy.  Return in about 6 months (around 02/21/2017) for prostate biopsy.  Nickie Retort, MD  [facility] [address], [place], [place] [address] [phone]

## 2016-08-24 ENCOUNTER — Ambulatory Visit: Payer: BLUE CROSS/BLUE SHIELD

## 2016-08-29 ENCOUNTER — Telehealth: Payer: Self-pay

## 2016-08-29 NOTE — Telephone Encounter (Signed)
Pt called stating he faxed a form over to be filled out for insurance in reference to prostate cancer. Do you know anything about the form? Pt is aware you are out of the office this week and it will be the end of next week before you return.

## 2016-09-05 NOTE — Telephone Encounter (Signed)
Per Dr. Pilar Jarvis he signed the form earlier and gave it to Amy.

## 2016-09-26 DIAGNOSIS — E782 Mixed hyperlipidemia: Secondary | ICD-10-CM | POA: Diagnosis not present

## 2016-09-26 DIAGNOSIS — Z0001 Encounter for general adult medical examination with abnormal findings: Secondary | ICD-10-CM | POA: Diagnosis not present

## 2016-09-26 DIAGNOSIS — E1165 Type 2 diabetes mellitus with hyperglycemia: Secondary | ICD-10-CM | POA: Diagnosis not present

## 2016-10-04 DIAGNOSIS — Z0001 Encounter for general adult medical examination with abnormal findings: Secondary | ICD-10-CM | POA: Diagnosis not present

## 2016-10-04 DIAGNOSIS — E119 Type 2 diabetes mellitus without complications: Secondary | ICD-10-CM | POA: Diagnosis not present

## 2016-10-04 DIAGNOSIS — D696 Thrombocytopenia, unspecified: Secondary | ICD-10-CM | POA: Diagnosis not present

## 2016-10-04 DIAGNOSIS — F17210 Nicotine dependence, cigarettes, uncomplicated: Secondary | ICD-10-CM | POA: Diagnosis not present

## 2016-10-04 DIAGNOSIS — E785 Hyperlipidemia, unspecified: Secondary | ICD-10-CM | POA: Diagnosis not present

## 2016-10-10 DIAGNOSIS — H401123 Primary open-angle glaucoma, left eye, severe stage: Secondary | ICD-10-CM | POA: Diagnosis not present

## 2016-10-21 ENCOUNTER — Encounter: Payer: Self-pay | Admitting: Emergency Medicine

## 2016-10-21 ENCOUNTER — Emergency Department
Admission: EM | Admit: 2016-10-21 | Discharge: 2016-10-21 | Disposition: A | Discharge status: Home / Self Care | Payer: BLUE CROSS/BLUE SHIELD | Attending: Emergency Medicine | Admitting: Emergency Medicine

## 2016-10-21 DIAGNOSIS — F17210 Nicotine dependence, cigarettes, uncomplicated: Secondary | ICD-10-CM | POA: Insufficient documentation

## 2016-10-21 DIAGNOSIS — Z8546 Personal history of malignant neoplasm of prostate: Secondary | ICD-10-CM | POA: Insufficient documentation

## 2016-10-21 DIAGNOSIS — H1132 Conjunctival hemorrhage, left eye: Secondary | ICD-10-CM

## 2016-10-21 HISTORY — DX: Malignant (primary) neoplasm, unspecified: C80.1

## 2016-10-21 NOTE — ED Provider Notes (Signed)
[facility] Emergency Department Provider Note  ____________________________________________  Time seen: Approximately 6:37 PM  I have reviewed the triage vital signs and the nursing notes.   HISTORY  Chief Complaint Eye Injury    HPI Donald Mendoza is a 56 y.o. male that presents to the emergency department with left eye redness since this afternoon. Patient states that he was in the kitchen when he felt something wet in his eye. When he looked in the mirror, his eye was red. It does not hurt and he has not having vision changes. No trauma. He has not been coughing or straining. He saw his eye doctor 2 weeks ago. He denies fever, headache, vision changes, difficulty moving eye, pain, floaters, flashers, shortness of breath, chest pain, nausea, vomiting, abdominal pain.   Past Medical History:  Diagnosis Date  . Cancer [facility]) 2018   Prostate Cancer  . Diverticulosis   . GERD (gastroesophageal reflux disease)   . Hypercholesteremia     Patient Active Problem List   Diagnosis Date Noted  . Elevated PSA 07/18/2016  . Urinary hesitancy 07/18/2016    Past Surgical History:  Procedure Laterality Date  . COLONOSCOPY    . HERNIA REPAIR     10 years ago, [facility]    Prior to Admission medications   Medication Sig Start Date End Date Taking? Authorizing Provider  ciprofloxacin (CIPRO) 500 MG tablet Take 500 mg by mouth 2 (two) times daily.    [provider]  latanoprost (XALATAN) 0.005 % ophthalmic solution  12/09/15   [provider]  omeprazole (PRILOSEC) 40 MG capsule Take 40 mg by mouth daily.    [provider]  pantoprazole (PROTONIX) 40 MG tablet Take 1 tablet (40 mg total) by mouth daily. 04/15/15 04/14/16  Nance Pear, MD  pravastatin (PRAVACHOL) 40 MG tablet Take 40 mg by mouth daily.    [provider]    Allergies Sulfa antibiotics  Family History  Problem Relation Age of Onset  . Cancer Mother      Breast  . Diabetes Mother   . Cancer Sister        Breast     Social History Social History  Substance Use Topics  . Smoking status: Current Every Day Smoker    Packs/day: 0.50    Years: 10.00    Types: Cigarettes  . Smokeless tobacco: Never Used  . Alcohol use Yes     Review of Systems  Constitutional: No fever/chills Cardiovascular: No chest pain. Respiratory: No SOB. Gastrointestinal: No abdominal pain.  No nausea, no vomiting.  Musculoskeletal: Negative for musculoskeletal pain. Skin: Negative for rash, abrasions, lacerations, ecchymosis. Neurological: Negative for headaches, numbness or tingling   ____________________________________________   PHYSICAL EXAM:  VITAL SIGNS: ED Triage Vitals [10/21/16 1734]  Enc Vitals Group     BP 118/82     Pulse Rate 84     Resp 16     Temp 98.7 F (37.1 C)     Temp src      SpO2 97 %     Weight 165 lb (74.8 kg)     Height 5\' 7"  (1.702 m)     Head Circumference      Peak Flow      Pain Score      Pain Loc      Pain Edu?      Excl. in [place]?      Constitutional: Alert and oriented. Well appearing and in no acute distress. Eyes:  Right conjunctivae is normal. Left conjunctivae has bright red patch at inferior pole that does not extend into iris. PERRL. EOMI. Head: ENT:      Ears:      Nose: No congestion/rhinnorhea.      Mouth/Throat: Mucous membranes are moist.  Neck: No stridor.   Cardiovascular: Normal rate, regular rhythm.  Good peripheral circulation. Respiratory: Normal respiratory effort without tachypnea or retractions. Lungs CTAB. Good air entry to the bases with no decreased or absent breath sounds. Musculoskeletal: Full range of motion to all extremities. No gross deformities appreciated. Neurologic:  Normal speech and language. No gross focal neurologic deficits are appreciated.  Skin:  Skin is warm, dry and intact. No rash noted. Psychiatric: Mood and affect are normal. Speech and behavior are normal.  Patient exhibits appropriate insight and judgement.   ____________________________________________   LABS (all labs ordered are listed, but only abnormal results are displayed)  Labs Reviewed - No data to display ____________________________________________  EKG   ____________________________________________  RADIOLOGY  No results found.  ____________________________________________    PROCEDURES  Procedure(s) performed:    Procedures    Medications - No data to display   ____________________________________________   INITIAL IMPRESSION / ASSESSMENT AND PLAN / ED COURSE  Pertinent labs & imaging results that were available during my care of the patient were reviewed by me and considered in my medical decision making (see chart for details).  Review of the [place] CSRS was performed in accordance of the [place] prior to dispensing any controlled drugs.     Patient's diagnosis is consistent with subconjunctival hemorrhage. Vital signs and exam are reassuring. Patient denies any pain or vision changes. Patient is to follow up with ophthalmology as directed. Patient is given ED precautions to return to the ED for any worsening or new symptoms.     ____________________________________________  FINAL CLINICAL IMPRESSION(S) / ED DIAGNOSES  Final diagnoses:  Subconjunctival hemorrhage of left eye      NEW MEDICATIONS STARTED DURING THIS VISIT:  Discharge Medication List as of 10/21/2016  6:39 PM          This chart was dictated using voice recognition software/Dragon. Despite best efforts to proofread, errors can occur which can change the meaning. Any change was purely unintentional.    Laban Emperor, PA-C 10/21/16 2020    Eula Listen, MD 10/21/16 2328

## 2016-10-21 NOTE — ED Triage Notes (Signed)
Pt in via POV, reports he was standing in kitchen, felt a wet sensation on his face, went to bathroom and noticed blood to left eye, appears as if blood vessel has burst.  Pt denies any changes to vision.  Ambulatory to triage.  Vitals WDL.

## 2016-11-05 ENCOUNTER — Emergency Department: Payer: BLUE CROSS/BLUE SHIELD

## 2016-11-05 ENCOUNTER — Encounter: Payer: Self-pay | Admitting: Emergency Medicine

## 2016-11-05 ENCOUNTER — Emergency Department
Admission: EM | Admit: 2016-11-05 | Discharge: 2016-11-05 | Disposition: A | Discharge status: Home / Self Care | Payer: BLUE CROSS/BLUE SHIELD | Attending: Emergency Medicine | Admitting: Emergency Medicine

## 2016-11-05 DIAGNOSIS — Z8546 Personal history of malignant neoplasm of prostate: Secondary | ICD-10-CM | POA: Diagnosis not present

## 2016-11-05 DIAGNOSIS — M542 Cervicalgia: Secondary | ICD-10-CM | POA: Insufficient documentation

## 2016-11-05 DIAGNOSIS — F17210 Nicotine dependence, cigarettes, uncomplicated: Secondary | ICD-10-CM | POA: Insufficient documentation

## 2016-11-05 DIAGNOSIS — S199XXA Unspecified injury of neck, initial encounter: Secondary | ICD-10-CM | POA: Diagnosis not present

## 2016-11-05 DIAGNOSIS — M25512 Pain in left shoulder: Secondary | ICD-10-CM | POA: Diagnosis not present

## 2016-11-05 MED ORDER — MELOXICAM 7.5 MG PO TABS: 7.5000 mg | ORAL_TABLET | Freq: Every day | ORAL | 1 refills | Status: AC

## 2016-11-05 MED ORDER — KETOROLAC TROMETHAMINE 30 MG/ML IJ SOLN
30.0000 mg | Freq: Once | INTRAMUSCULAR | Status: AC
  Administered 2016-11-05: 30 mg via INTRAMUSCULAR
  Filled 2016-11-05: qty 1

## 2016-11-05 MED ORDER — CYCLOBENZAPRINE HCL 5 MG PO TABS: 5.0000 mg | ORAL_TABLET | Freq: Three times a day (TID) | ORAL | 0 refills | Status: AC | PRN
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
## 2016-11-07 ENCOUNTER — Emergency Department: Payer: BLUE CROSS/BLUE SHIELD

## 2016-11-07 ENCOUNTER — Encounter: Payer: Self-pay | Admitting: Medical Oncology

## 2016-11-07 ENCOUNTER — Emergency Department
Admission: EM | Admit: 2016-11-07 | Discharge: 2016-11-07 | Disposition: A | Discharge status: Home / Self Care | Payer: BLUE CROSS/BLUE SHIELD | Attending: Emergency Medicine | Admitting: Emergency Medicine

## 2016-11-07 DIAGNOSIS — Y92410 Unspecified street and highway as the place of occurrence of the external cause: Secondary | ICD-10-CM | POA: Insufficient documentation

## 2016-11-07 DIAGNOSIS — Z7982 Long term (current) use of aspirin: Secondary | ICD-10-CM | POA: Diagnosis not present

## 2016-11-07 DIAGNOSIS — Z79899 Other long term (current) drug therapy: Secondary | ICD-10-CM | POA: Insufficient documentation

## 2016-11-07 DIAGNOSIS — Y9389 Activity, other specified: Secondary | ICD-10-CM | POA: Insufficient documentation

## 2016-11-07 DIAGNOSIS — R220 Localized swelling, mass and lump, head: Secondary | ICD-10-CM | POA: Diagnosis not present

## 2016-11-07 DIAGNOSIS — C61 Malignant neoplasm of prostate: Secondary | ICD-10-CM | POA: Diagnosis not present

## 2016-11-07 DIAGNOSIS — S0083XA Contusion of other part of head, initial encounter: Secondary | ICD-10-CM | POA: Diagnosis not present

## 2016-11-07 DIAGNOSIS — Y998 Other external cause status: Secondary | ICD-10-CM | POA: Diagnosis not present

## 2016-11-07 DIAGNOSIS — Z7902 Long term (current) use of antithrombotics/antiplatelets: Secondary | ICD-10-CM | POA: Insufficient documentation

## 2016-11-07 DIAGNOSIS — F17210 Nicotine dependence, cigarettes, uncomplicated: Secondary | ICD-10-CM | POA: Diagnosis not present

## 2016-11-07 DIAGNOSIS — S0993XA Unspecified injury of face, initial encounter: Secondary | ICD-10-CM | POA: Diagnosis present
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
## 2016-11-07 NOTE — ED Notes (Signed)
Pt to ed with c/o right sided facial pain since yesterday. Pt states yesterday face was swollen but he used ice on it last night and the swelling decreased.  Pt reports increased pain with talking and movement of jaw or eating.

## 2016-11-07 NOTE — Discharge Instructions (Signed)
Continue previous medications

## 2016-11-07 NOTE — ED Notes (Signed)
Patient transported to CT 

## 2016-11-15 IMAGING — CR DG CHEST 2V
2 series · 2 of 2 positions shown · non-contrast
Comparison: Prior chest x-ray 10/07/2010; prior CT abdomen/pelvis
04/23/2013

CLINICAL DATA: 54-year-old male with mid chest pain, abdominal pain
and vomiting since last night

EXAM:
CHEST  2 VIEW

[chest pa]
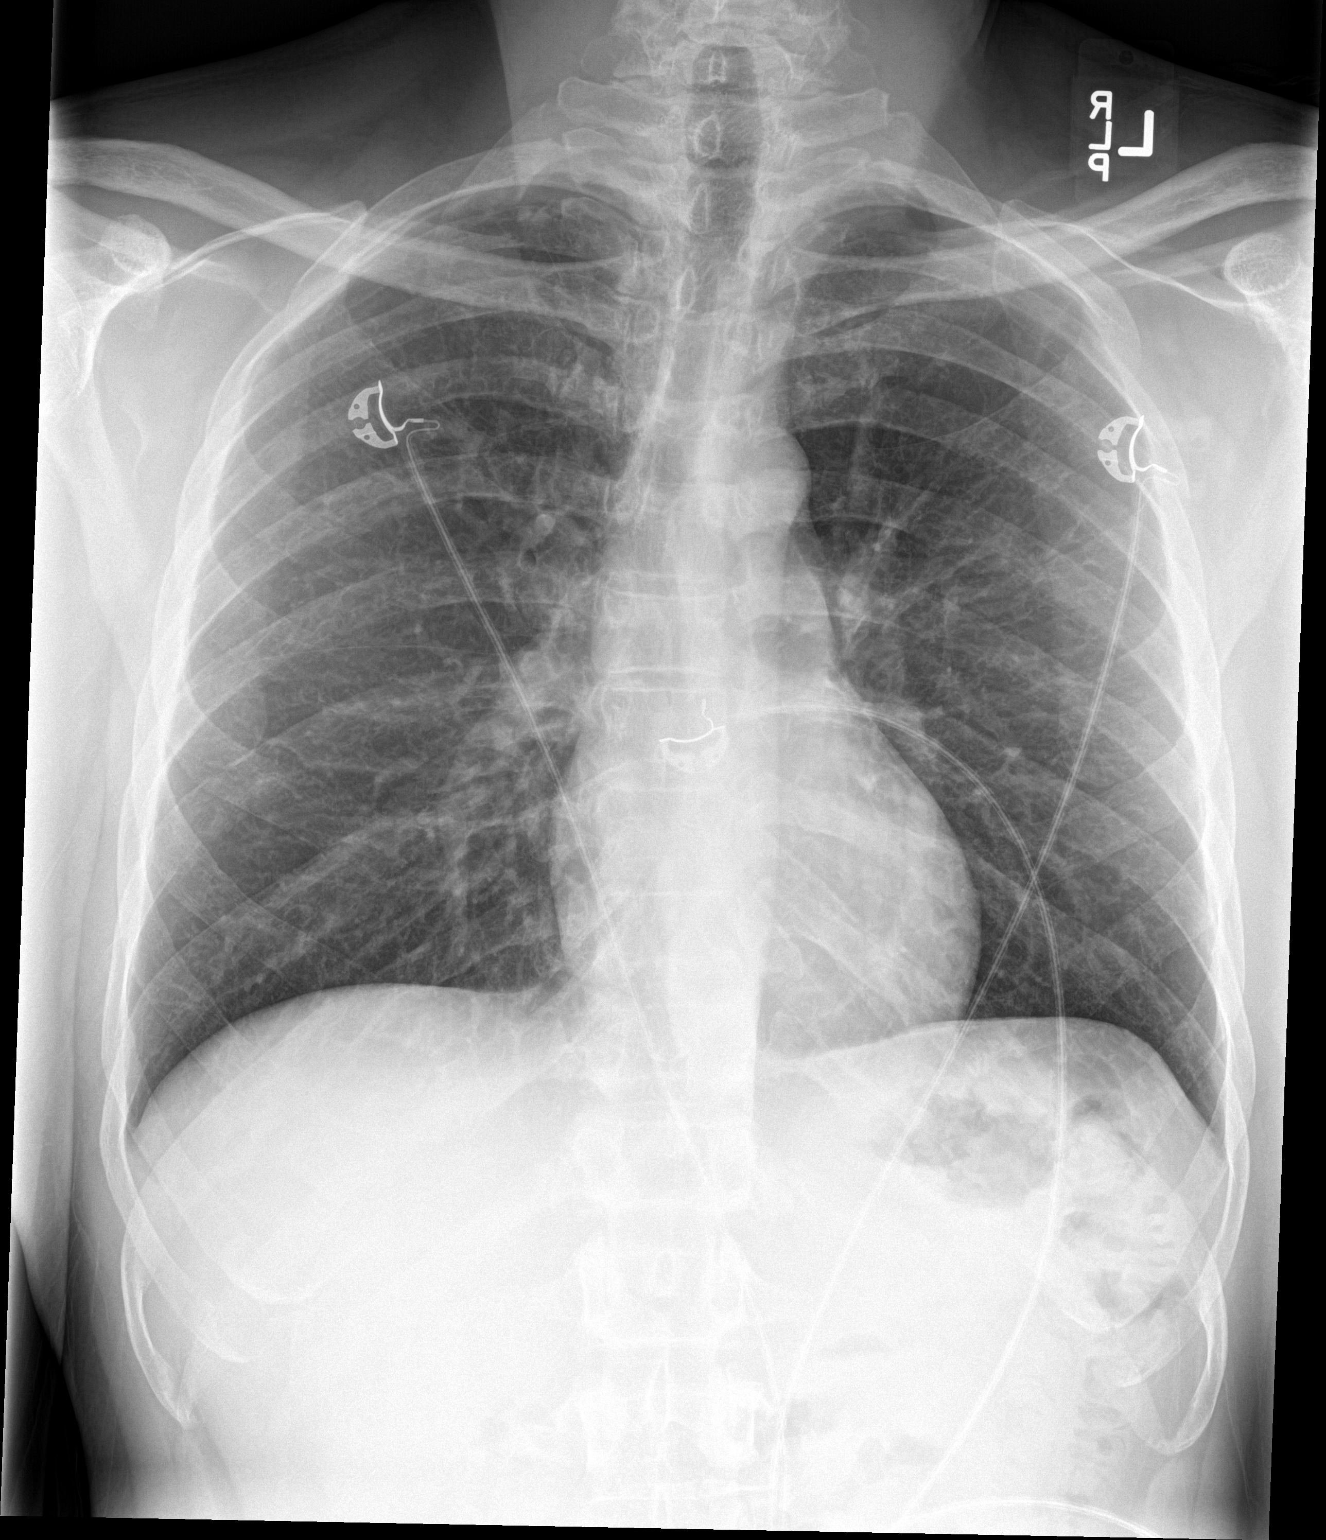

[chest lat]
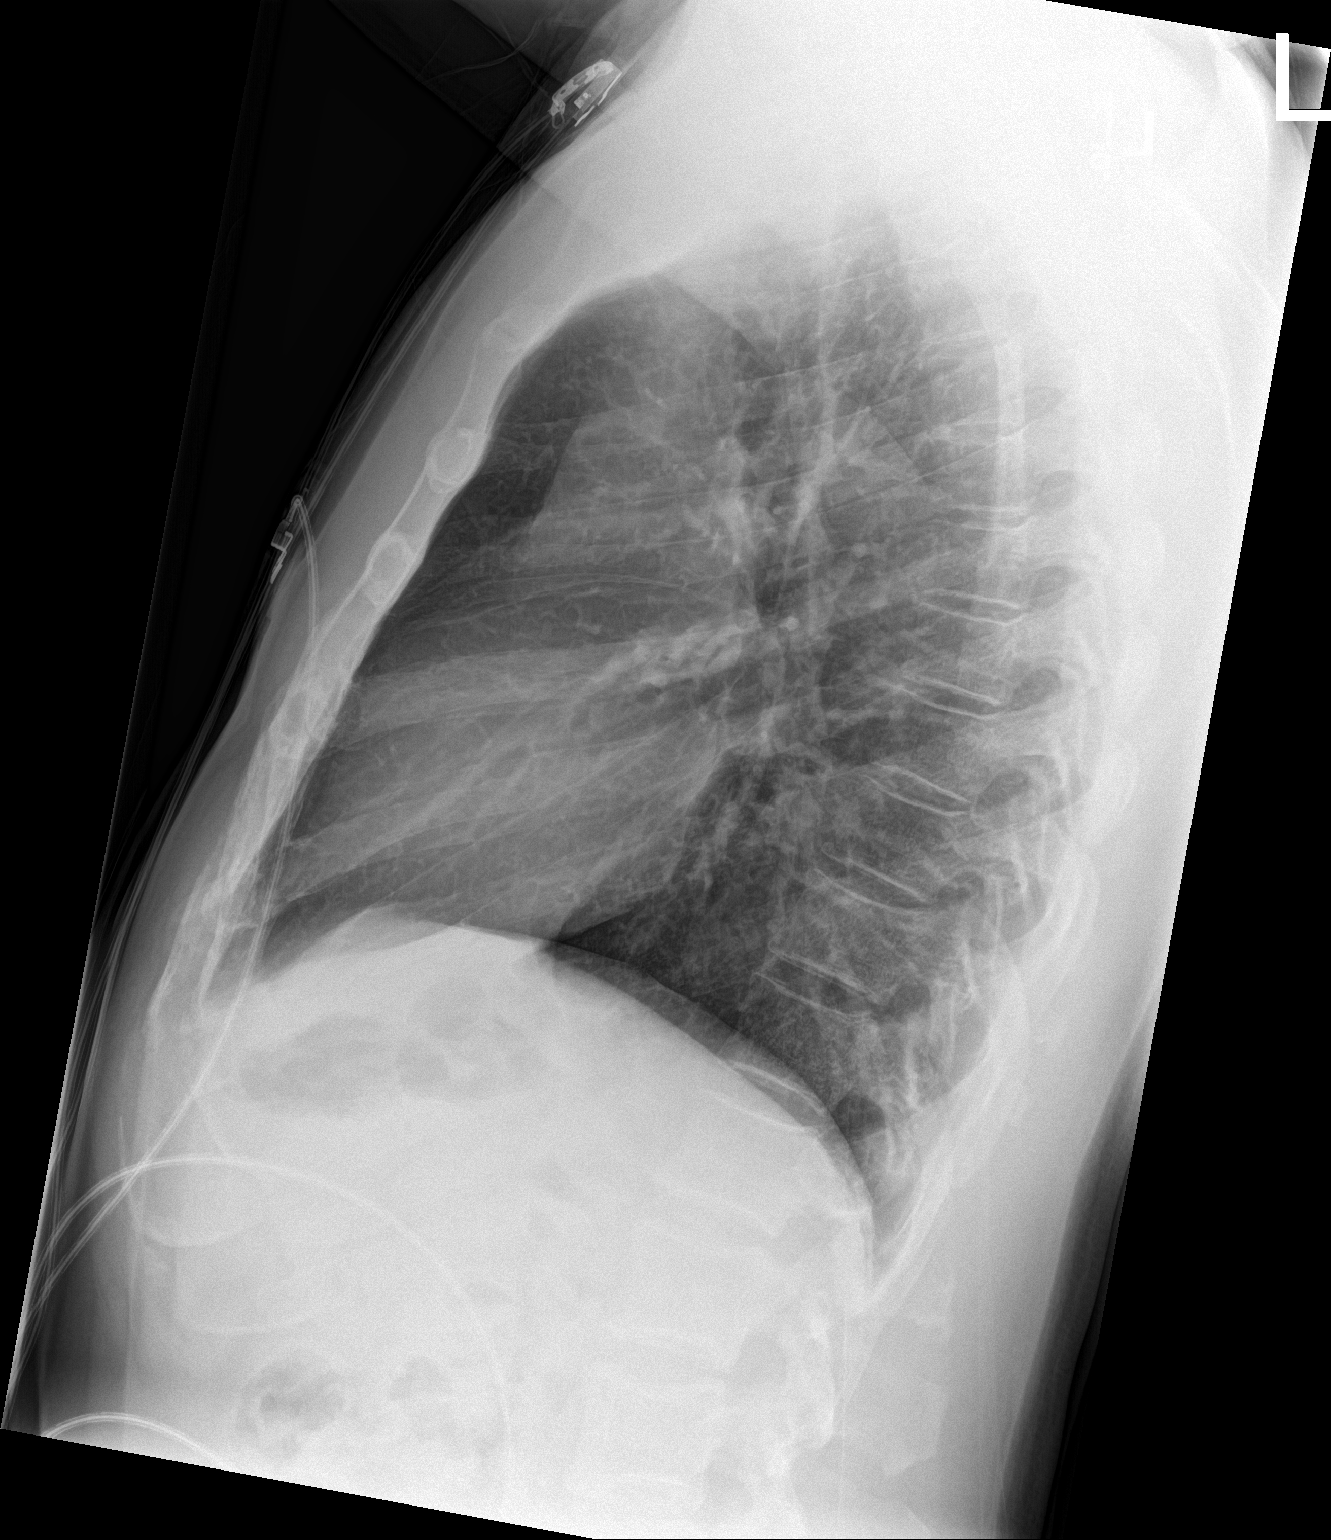

[2 of 2 positions shown; findings below may reference images not displayed]

FINDINGS: The lungs are clear and negative for focal airspace consolidation,
pulmonary edema or suspicious pulmonary nodule. No pleural effusion
or pneumothorax. Cardiac and mediastinal contours are within normal
limits. No acute fracture or lytic or blastic osseous lesions. The
visualized upper abdominal bowel gas pattern is unremarkable.
IMPRESSION: No active cardiopulmonary disease.

## 2016-11-22 DIAGNOSIS — M542 Cervicalgia: Secondary | ICD-10-CM | POA: Diagnosis not present

## 2017-02-15 ENCOUNTER — Encounter: Payer: Self-pay | Admitting: Emergency Medicine

## 2017-02-15 ENCOUNTER — Emergency Department
Admission: EM | Admit: 2017-02-15 | Discharge: 2017-02-15 | Disposition: A | Discharge status: Home / Self Care | Payer: BLUE CROSS/BLUE SHIELD | Attending: Emergency Medicine | Admitting: Emergency Medicine

## 2017-02-15 DIAGNOSIS — M50122 Cervical disc disorder at C5-C6 level with radiculopathy: Secondary | ICD-10-CM | POA: Diagnosis not present

## 2017-02-15 DIAGNOSIS — Z79899 Other long term (current) drug therapy: Secondary | ICD-10-CM | POA: Diagnosis not present

## 2017-02-15 DIAGNOSIS — M5412 Radiculopathy, cervical region: Secondary | ICD-10-CM

## 2017-02-15 DIAGNOSIS — F17210 Nicotine dependence, cigarettes, uncomplicated: Secondary | ICD-10-CM | POA: Insufficient documentation

## 2017-02-15 DIAGNOSIS — R202 Paresthesia of skin: Secondary | ICD-10-CM | POA: Insufficient documentation

## 2017-02-15 DIAGNOSIS — Z8546 Personal history of malignant neoplasm of prostate: Secondary | ICD-10-CM | POA: Diagnosis not present

## 2017-02-15 DIAGNOSIS — R200 Anesthesia of skin: Secondary | ICD-10-CM | POA: Diagnosis present

## 2017-02-15 MED ORDER — METHYLPREDNISOLONE 4 MG PO TBPK: ORAL_TABLET | ORAL | 0 refills | Status: DC
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
## 2017-02-15 NOTE — ED Notes (Signed)
This RN performed patients triage.

## 2017-02-15 NOTE — ED Triage Notes (Signed)
Pt in via POV with complaints of intermittent left arm numbness/tingling and shoulder pain x approximately 2 weeks; pt reports pain was worse this morning when he woke up.  Pt denies any other symptoms, pt is neurologically intact.  Family advised him to be evaluated.  Pt ambulatory to triage without difficulty, vitals WDL.

## 2017-02-22 ENCOUNTER — Other Ambulatory Visit: Payer: Self-pay | Admitting: Urology

## 2017-02-22 ENCOUNTER — Ambulatory Visit (INDEPENDENT_AMBULATORY_CARE_PROVIDER_SITE_OTHER): Payer: BLUE CROSS/BLUE SHIELD | Admitting: Urology

## 2017-02-22 ENCOUNTER — Encounter: Payer: Self-pay | Admitting: Urology

## 2017-02-22 VITALS — BP 121/85 | HR 84 | Ht 67.0 in | Wt 154.3 lb

## 2017-02-22 DIAGNOSIS — C61 Malignant neoplasm of prostate: Secondary | ICD-10-CM

## 2017-02-22 DIAGNOSIS — R972 Elevated prostate specific antigen [PSA]: Secondary | ICD-10-CM | POA: Diagnosis not present

## 2017-02-22 MED ORDER — GENTAMICIN SULFATE 40 MG/ML IJ SOLN
80.0000 mg | Freq: Once | INTRAMUSCULAR | Status: AC
  Administered 2017-02-22: 80 mg via INTRAMUSCULAR

## 2017-02-22 MED ORDER — LEVOFLOXACIN 500 MG PO TABS
500.0000 mg | ORAL_TABLET | Freq: Once | ORAL | Status: AC
  Administered 2017-02-22: 500 mg via ORAL

## 2017-02-22 MED ORDER — LIDOCAINE HCL 2 % EX GEL
1.0000 "application " | Freq: Once | CUTANEOUS | Status: AC
  Administered 2017-02-22: 1 via URETHRAL

## 2017-02-22 NOTE — Progress Notes (Signed)
Prostate Biopsy Procedure   Informed consent was obtained after discussing risks/benefits of the procedure.  A time out was performed to ensure correct patient identity.  Pre-Procedure: - Last PSA Level: 4.0 - Gentamicin given prophylactically - Levaquin 500 mg administered PO -Transrectal Ultrasound performed revealing a 30 gm prostate -No significant hypoechoic or median lobe noted  Procedure: - Prostate block performed using 10 cc 1% lidocaine and biopsies taken from sextant areas, a total of 12 under ultrasound guidance.  Post-Procedure: - Patient tolerated the procedure well - He was counseled to seek immediate medical attention if experiences any severe pain, significant bleeding, or fevers - Return in one week to discuss biopsy results

## 2017-02-23 LAB — PSA: PROSTATE SPECIFIC AG, SERUM: 5.2 ng/mL — AB (ref 0.0–4.0)

## 2017-03-01 DIAGNOSIS — M542 Cervicalgia: Secondary | ICD-10-CM | POA: Diagnosis not present

## 2017-03-01 DIAGNOSIS — M47812 Spondylosis without myelopathy or radiculopathy, cervical region: Secondary | ICD-10-CM | POA: Diagnosis not present

## 2017-03-01 DIAGNOSIS — M79602 Pain in left arm: Secondary | ICD-10-CM | POA: Diagnosis not present

## 2017-03-01 LAB — PATHOLOGY REPORT

## 2017-03-04 ENCOUNTER — Other Ambulatory Visit: Payer: Self-pay | Admitting: Urology

## 2017-03-06 ENCOUNTER — Other Ambulatory Visit: Payer: Self-pay | Admitting: Student

## 2017-03-06 DIAGNOSIS — M542 Cervicalgia: Secondary | ICD-10-CM

## 2017-03-08 ENCOUNTER — Ambulatory Visit (INDEPENDENT_AMBULATORY_CARE_PROVIDER_SITE_OTHER): Payer: BLUE CROSS/BLUE SHIELD | Admitting: Urology

## 2017-03-08 ENCOUNTER — Encounter: Payer: Self-pay | Admitting: Urology

## 2017-03-08 VITALS — BP 115/71 | HR 67 | Ht 67.0 in | Wt 158.2 lb

## 2017-03-08 DIAGNOSIS — C61 Malignant neoplasm of prostate: Secondary | ICD-10-CM | POA: Diagnosis not present

## 2017-03-08 NOTE — Progress Notes (Signed)
03/08/2017 3:40 PM   Donald Mendoza 05-Mar-1961 [id number]  Referring provider: Christie Nottingham, [place], [place] [address]  No chief complaint on file.   HPI: The patient is a 56 year old gentleman presents today for repeat prostate biopsy results after being started previously on active surveillance.  1 - Intermediate Risk Prostate Cancer-    Pathology History: March 2018: Gleason 3+3 = 6 in 16% of 1 of 12 cores at right lateral base. PSA at diagnosis: 4.0  October 2018: Gleason 4+3= 7 in 3 of 12 cores. PSA: 5.2  -Gleason 4+3 = 7 in 7% of RMB  -Gleason 3+3 = 6 in 12%,2% of RLB,RLM  PSA History: 2014 - PSA 1.4 2017 - PSA 3.8 2018 - PSA 4.0 / DRE 50gm smooth - time of diagnosis   2018(Oct) - PSA 5.2  Normogram: OCD: 44% ECE: 53% LNI: 6% SVI: 5%  Patient w/ hx of umbilical and inguinal hernia repairs.  Patient notes excellent erections that last to the completion of intercourse. He does not take medications. His only urinary complaint is nocturia 6. Denies daytime frequency, urgency, weak stream.  PMH: Past Medical History:  Diagnosis Date  . Cancer [facility]) 2018   Prostate Cancer  . Diverticulosis   . GERD (gastroesophageal reflux disease)   . Hypercholesteremia     Surgical History: Past Surgical History:  Procedure Laterality Date  . COLONOSCOPY    . HERNIA REPAIR     10 years ago, [facility]    Home Medications:  Allergies as of 03/08/2017      Reactions   Sulfa Antibiotics Hives      Medication List       Accurate as of 03/08/17  3:40 PM. Always use your most recent med list.          latanoprost 0.005 % ophthalmic solution Commonly known as:  XALATAN   pantoprazole 40 MG tablet Commonly known as:  PROTONIX Take 1 tablet (40 mg total) by mouth daily.   pravastatin 40 MG tablet Commonly known as:  PRAVACHOL Take 40 mg by mouth daily.       Allergies:  Allergies  Allergen Reactions  . Sulfa Antibiotics Hives     Family History: Family History  Problem Relation Age of Onset  . Cancer Mother        Breast  . Diabetes Mother   . Cancer Sister        Breast     Social History:  reports that he has been smoking Cigarettes.  He has a 5.00 pack-year smoking history. He has never used smokeless tobacco. He reports that he drinks about 4.2 oz of alcohol per week . He reports that he does not use drugs.  ROS: UROLOGY Frequent Urination?: Yes Hard to postpone urination?: No Burning/pain with urination?: No Get up at night to urinate?: No Leakage of urine?: No Urine stream starts and stops?: No Trouble starting stream?: No Do you have to strain to urinate?: No Blood in urine?: No Urinary tract infection?: No Sexually transmitted disease?: No Injury to kidneys or bladder?: No Painful intercourse?: No Weak stream?: No Erection problems?: No Penile pain?: No  Gastrointestinal Nausea?: No Vomiting?: No Indigestion/heartburn?: No Diarrhea?: No Constipation?: No  Constitutional Fever: No Night sweats?: No Weight loss?: No Fatigue?: No  Skin Skin rash/lesions?: No Itching?: No  Eyes Blurred vision?: No Double vision?: No  Ears/Nose/Throat Sore throat?: No Sinus problems?: No  Hematologic/Lymphatic Swollen glands?: No Easy bruising?: No  Cardiovascular Leg swelling?: No Chest pain?: No  Respiratory Cough?: No Shortness of breath?: No  Endocrine Excessive thirst?: No  Musculoskeletal Back pain?: No Joint pain?: No  Neurological Headaches?: No Dizziness?: No  Psychologic Depression?: No Anxiety?: No  Physical Exam: BP 115/71 (BP Location: Right Arm, Patient Position: Sitting, Cuff Size: Normal)   Pulse 67   Ht 5\' 7"  (1.702 m)   Wt 158 lb 3.2 oz (71.8 kg)   BMI 24.78 kg/m   Constitutional:  Alert and oriented, No acute distress. HEENT: [place] AT, moist mucus membranes.  Trachea midline, no masses. Cardiovascular: No clubbing, cyanosis, or  edema. Respiratory: Normal respiratory effort, no increased work of breathing. GI: Abdomen is soft, nontender, nondistended, no abdominal masses GU: No CVA tenderness.  Skin: No rashes, bruises or suspicious lesions. Lymph: No cervical or inguinal adenopathy. Neurologic: Grossly intact, no focal deficits, moving all 4 extremities. Psychiatric: Normal mood and affect.  Laboratory Data: Lab Results  Component Value Date   WBC 10.4 04/15/2015   HGB 16.2 04/15/2015   HCT 49.5 04/15/2015   MCV 89.5 04/15/2015   PLT PLATELET CLUMPS NOTED ON SMEAR, UNABLE TO ESTIMATE 04/15/2015    Lab Results  Component Value Date   CREATININE 0.84 04/15/2015    No results found for: PSA  No results found for: TESTOSTERONE  No results found for: HGBA1C  Urinalysis    Component Value Date/Time   COLORURINE Yellow 04/23/2013 1658   APPEARANCEUR Clear 07/18/2016 1045   LABSPEC 1.026 04/23/2013 1658   PHURINE 5.0 04/23/2013 1658   GLUCOSEU Negative 07/18/2016 1045   GLUCOSEU Negative 04/23/2013 1658   HGBUR 1+ 04/23/2013 1658   BILIRUBINUR Negative 07/18/2016 1045   BILIRUBINUR Negative 04/23/2013 1658   KETONESUR Negative 04/23/2013 1658   PROTEINUR Negative 07/18/2016 1045   PROTEINUR 100 mg/dL 04/23/2013 1658   NITRITE Negative 07/18/2016 1045   NITRITE Negative 04/23/2013 1658   LEUKOCYTESUR Negative 07/18/2016 1045   LEUKOCYTESUR Negative 04/23/2013 1658    Assessment & Plan:    1. Low volume intermediate risk prostate cancer I discussed the patient that his repeat biopsy showed 1 core of Gleason 4+3 = 7 prostate cancer. We did discuss that this moves him from a very low risk category to an intermediate risk category. We did discuss that generally speaking active treatment is generally recommended in this age group, particularly in someone who is young and healthy as himself due to high risk for metastatic progression. We discussed both robotic prostatectomy and radiation therapy risks  and benefits. We discussed the benefit of undergoing a prostatectomy is that radiation can be used in the future if there is residual or recurrent disease. He is interested in undergoing a bilateral nerve sparing robotic prostatectomy with bilateral pelvic lymph node dissection. We discussed the risks of this operation which include but are not limited to bleeding, infection, iatrogenic injury, erectile dysfunction, urinary incontinence, DVT, anesthetic complications, and postoperative hernia. He does understand that he will have a Foley catheter for 7-10 days postoperatively as well as possibly an abdominal drain. He does know that he will be admitted to the hospital for at least 1 night. He does understand that he will not be able to partake in strenuous activity for at least 6 weeks and not be able to lift anything heavier than a gallon of milk during that period. We also discussed in detail the long-term consequences of the surgery which include erectile dysfunction and stress urinary incontinence. I have set him up  to see my partner, Dr. Erlene Quan, who has extensive experience performing the nerve sparing portion of this operation. In the interim, I will arrange for him to begin pelvic floor physical therapy to increase the likelihood that he will be continent postoperatively.  I spent 30 minutes in direct face-to-face conversation with the patient and his sister.  Nickie Retort, MD  [facility] [address] [place], [place] [address] [phone]

## 2017-03-13 ENCOUNTER — Ambulatory Visit
Admission: RE | Admit: 2017-03-13 | Discharge: 2017-03-13 | Disposition: A | Discharge status: Home / Self Care | Payer: BLUE CROSS/BLUE SHIELD | Source: Ambulatory Visit | Attending: Student | Admitting: Student

## 2017-03-13 DIAGNOSIS — M542 Cervicalgia: Secondary | ICD-10-CM

## 2017-03-13 DIAGNOSIS — M79602 Pain in left arm: Secondary | ICD-10-CM | POA: Insufficient documentation

## 2017-03-13 DIAGNOSIS — M4802 Spinal stenosis, cervical region: Secondary | ICD-10-CM | POA: Diagnosis not present

## 2017-03-13 DIAGNOSIS — M47812 Spondylosis without myelopathy or radiculopathy, cervical region: Secondary | ICD-10-CM | POA: Diagnosis not present

## 2017-03-14 ENCOUNTER — Ambulatory Visit (INDEPENDENT_AMBULATORY_CARE_PROVIDER_SITE_OTHER): Payer: BLUE CROSS/BLUE SHIELD | Admitting: Urology

## 2017-03-14 ENCOUNTER — Encounter: Payer: Self-pay | Admitting: Urology

## 2017-03-14 VITALS — BP 99/63 | HR 78 | Ht 67.0 in | Wt 159.0 lb

## 2017-03-14 DIAGNOSIS — C61 Malignant neoplasm of prostate: Secondary | ICD-10-CM | POA: Diagnosis not present

## 2017-03-14 DIAGNOSIS — E119 Type 2 diabetes mellitus without complications: Secondary | ICD-10-CM | POA: Insufficient documentation

## 2017-03-14 DIAGNOSIS — E559 Vitamin D deficiency, unspecified: Secondary | ICD-10-CM | POA: Insufficient documentation

## 2017-03-14 NOTE — Progress Notes (Signed)
03/14/2017 4:36 PM   Donald Mendoza [birth year] [id number]  Referring provider: [facility], [place] [address] [place], [place] [address]  Chief Complaint  Patient presents with  . Prostate Cancer    discuss surgery    HPI: 56 year old male with intermediate risk prostate cancer who presents today for second opinion for robotic prostatectomy.  He was initially diagnosed with low risk prostate cancer, Gleason 3+3 in 1 of 12 cores in 07/2016 at which time his PSA was 4.  His PSA continues to rise up to 5.2 in October 2018.  He underwent repeat prostate biopsy revealing Gleason 4+3 in 3 of 12 cores, all on the right side involving up to 12% of the tissue.  Rectal exam unremarkable.  TRUS vol 30g.  Normogram: OCD: 44% ECE: 53% LNI: 6% SVI: 5%  Patient w/ hx of umbilical and inguinal hernia repairs.  He is unsure if mesh was used.    Patient notes excellent erections that last to the completion of intercourse  His only urinary complaint is nocturia 2. Denies daytime frequency, urgency, weak stream.   Of note, he does have several family member with prostate cancer (uncle) and aunt/ mother/ sister with breast cancer who carry BRCA gene.  No children.         IPSS    Row Name 03/14/17 1100         International Prostate Symptom Score   How often have you had the sensation of not emptying your bladder? Not at All     How often have you had to urinate less than every two hours? Less than half the time     How often have you found you stopped and started again several times when you urinated? Not at All     How often have you found it difficult to postpone urination? Less than half the time     How often have you had a weak urinary stream? Not at All     How often have you had to strain to start urination? Not at All     How many times did you typically get up at night to urinate? 2 Times     Total IPSS Score 6       Quality of Life due to urinary symptoms   If  you were to spend the rest of your life with your urinary condition just the way it is now how would you feel about that? Pleased        Score:  1-7 Mild 8-19 Moderate 20-35 Severe      SHIM    Row Name 03/14/17 1154         SHIM: Over the last 6 months:   How do you rate your confidence that you could get and keep an erection? High     When you had erections with sexual stimulation, how often were your erections hard enough for penetration (entering your partner)? Almost Always or Always     During sexual intercourse, how often were you able to maintain your erection after you had penetrated (entered) your partner? Almost Always or Always     During sexual intercourse, how difficult was it to maintain your erection to completion of intercourse? Not Difficult     When you attempted sexual intercourse, how often was it satisfactory for you? Almost Always or Always       SHIM Total Score   SHIM 24  PMH: Past Medical History:  Diagnosis Date  . Cancer [facility]) 2018   Prostate Cancer  . Diverticulosis   . GERD (gastroesophageal reflux disease)   . Hypercholesteremia     Surgical History: Past Surgical History:  Procedure Laterality Date  . COLONOSCOPY    . HERNIA REPAIR     10 years ago, [facility]    Home Medications:  Allergies as of 03/14/2017      Reactions   Sulfa Antibiotics Hives      Medication List       Accurate as of 03/14/17  4:36 PM. Always use your most recent med list.          latanoprost 0.005 % ophthalmic solution Commonly known as:  XALATAN   pantoprazole 40 MG tablet Commonly known as:  PROTONIX Take 1 tablet (40 mg total) by mouth daily.   pravastatin 40 MG tablet Commonly known as:  PRAVACHOL Take 40 mg by mouth daily.       Allergies:  Allergies  Allergen Reactions  . Sulfa Antibiotics Hives    Family History: Family History  Problem Relation Age of Onset  . Cancer Mother        Breast  . Diabetes Mother   .  Cancer Sister        Breast     Social History:  reports that he has been smoking Cigarettes.  He has a 5.00 pack-year smoking history. He has never used smokeless tobacco. He reports that he drinks about 4.2 oz of alcohol per week . He reports that he does not use drugs.  ROS: UROLOGY Frequent Urination?: No Hard to postpone urination?: No Burning/pain with urination?: No Get up at night to urinate?: No Leakage of urine?: No Urine stream starts and stops?: No Trouble starting stream?: No Do you have to strain to urinate?: No Blood in urine?: No Urinary tract infection?: No Sexually transmitted disease?: No Injury to kidneys or bladder?: No Painful intercourse?: No Weak stream?: No Erection problems?: No Penile pain?: No  Gastrointestinal Nausea?: No Vomiting?: No Indigestion/heartburn?: No Diarrhea?: No Constipation?: No  Constitutional Fever: No Night sweats?: No Weight loss?: Yes Fatigue?: No  Skin Skin rash/lesions?: No Itching?: No  Eyes Blurred vision?: No Double vision?: No  Ears/Nose/Throat Sore throat?: No Sinus problems?: No  Hematologic/Lymphatic Swollen glands?: No Easy bruising?: No  Cardiovascular Leg swelling?: No Chest pain?: No  Respiratory Cough?: No Shortness of breath?: No  Endocrine Excessive thirst?: No  Musculoskeletal Back pain?: No Joint pain?: No  Neurological Headaches?: No Dizziness?: No  Psychologic Depression?: No Anxiety?: No  Physical Exam: BP 99/63   Pulse 78   Ht 5' 7"  (1.702 m)   Wt 159 lb (72.1 kg)   BMI 24.90 kg/m   Constitutional:  Alert and oriented, No acute distress. HEENT: [place] AT, moist mucus membranes.  Trachea midline, no masses. Cardiovascular: No clubbing, cyanosis, or edema. Respiratory: Normal respiratory effort, no increased work of breathing. GI: Abdomen is soft, nontender, nondistended, no abdominal masses.  Small subtle umbilical hernia scar appreciated just inferior to  umbilicus.  No other abd incision noted. GU: No CVA tenderness.  Skin: No rashes, bruises or suspicious lesions. Lymph: No cervical or inguinal adenopathy. Neurologic: Grossly intact, no focal deficits, moving all 4 extremities. Psychiatric: Normal mood and affect.  Laboratory Data: Lab Results  Component Value Date   WBC 10.4 04/15/2015   HGB 16.2 04/15/2015   HCT 49.5 04/15/2015   MCV 89.5 04/15/2015   PLT PLATELET  CLUMPS NOTED ON SMEAR, UNABLE TO ESTIMATE 04/15/2015    Lab Results  Component Value Date   CREATININE 0.84 04/15/2015    Lab Results  Component Value Date   PSA1 5.2 (H) 02/22/2017   Urinalysis Lab Results  Component Value Date   SPECGRAV 1.010 07/18/2016   PHUR 5.5 07/18/2016   COLORU Yellow 07/18/2016   APPEARANCEUR Clear 07/18/2016   LEUKOCYTESUR Negative 07/18/2016   PROTEINUR Negative 07/18/2016   GLUCOSEU Negative 07/18/2016   KETONESU Negative 07/18/2016   RBCU Negative 07/18/2016   BILIRUBINUR Negative 07/18/2016   UUROB 0.2 07/18/2016   NITRITE Negative 07/18/2016    Lab Results  Component Value Date   LABMICR See below: 07/18/2016   WBCUA 0-5 07/18/2016   RBCUA 0-2 07/18/2016   LABEPIT 0-10 07/18/2016   MUCUS Present (A) 07/18/2016   BACTERIA None seen 07/18/2016    Pertinent Imaging: n/a  Assessment & Plan:    1. Prostate cancer [facility]) The patient was counseled about the natural history of prostate cancer and the standard treatment options that are available for prostate cancer. It was explained to him how his age and life expectancy, clinical stage, Gleason score, and PSA affect his prognosis, the decision to proceed with additional staging studies, as well as how that information influences recommended treatment strategies. We discussed the roles for active surveillance, radiation therapy, surgical therapy, androgen deprivation, as well as ablative therapy options for the treatment of prostate cancer as appropriate to his individual  cancer situation. We discussed the risks and benefits of these options with regard to their impact on cancer control and also in terms of potential adverse events, complications, and impact on quality of life particularly related to urinary, bowel, and sexual function. The patient was encouraged to ask questions throughout the discussion today and all questions were answered to his stated satisfaction. In addition, the patient was providedwith and/or directed to appropriate resources and literature for further education about prostate cancer treatment options.  We discussed surgical therapy for prostate cancer including the different available surgical approaches. We discussed, in detail, the risks and expectations of surgery with regard to cancer control, urinary control, and erectile dysfunction as well as expected post operative cover he processed. Additional risks of surgery including but not limalited to bleeding, infection, hernia formation, nerve damage, steel formation, bowel/rect injury, potentially necessitating colostomy, damage to the urinary tract resulting in urinary leakage, urethral stricture, and cardiopulmonary risk such as myocardial infarction, stroke, death, thromboembolism etc. were explained. The risk of open surgical conversion for robotics/laparoscopic prostatectomy is also discussed.  Given his age and Gleason 4+3 pattern, I would highly recommend prostatectomy.  This can be done in a nerve sparing approach possibly with bilateral nerve sparing or at least full nerve sparing on the left and partial nerve sparing on the right.  He was offered referral to radiation oncology but declined.  I recommended that he see physical therapy prior to surgery in order to learn pelvic floor exercises and optimize his postoperative continence.  Discussion today was lengthy, all questions were answered.  He would like to book robotic prostatectomy with bilateral pelvic lymph node dissection.  -  Ambulatory referral to Physical Therapy  Hollice Espy, MD  [facility] [address] [place], [place] [address] [phone]  I spent 25 min with this patient of which greater than 50% was spent in counseling and coordination of care with the patient.

## 2017-03-27 ENCOUNTER — Ambulatory Visit: Payer: BLUE CROSS/BLUE SHIELD | Attending: Urology | Admitting: Physical Therapy

## 2017-03-27 ENCOUNTER — Encounter: Payer: Self-pay | Admitting: Physical Therapy

## 2017-03-27 DIAGNOSIS — R29898 Other symptoms and signs involving the musculoskeletal system: Secondary | ICD-10-CM | POA: Insufficient documentation

## 2017-03-27 DIAGNOSIS — M6208 Separation of muscle (nontraumatic), other site: Secondary | ICD-10-CM | POA: Diagnosis not present

## 2017-03-27 DIAGNOSIS — R278 Other lack of coordination: Secondary | ICD-10-CM | POA: Diagnosis not present

## 2017-03-27 NOTE — Patient Instructions (Signed)
Log rolling  Into and out of bed  To not strain abdominal and pelvic floor muscles         __________  Open book to release midback muscles  15 reps With pillow between knees/ and behind back       __________   To improve abdominal and pelvic coordination   Adjustment when laying on your back: Pick up hips with knees bent and lower ribs first then the low back and sacrum. The low back should feel less arched    Breathing 1-2 pause inhale, exhale 2-1 -pause

## 2017-03-28 ENCOUNTER — Encounter: Payer: Self-pay | Admitting: Physical Therapy

## 2017-03-28 ENCOUNTER — Other Ambulatory Visit: Payer: Self-pay | Admitting: Radiology

## 2017-03-28 DIAGNOSIS — C61 Malignant neoplasm of prostate: Secondary | ICD-10-CM

## 2017-03-28 NOTE — Therapy (Signed)
[place] MAIN [facility] SERVICES [address] [place], [place], [address] Phone: [phone]   Fax:  [phone]  Physical Therapy Evaluation  Patient Details  Name: Donald Mendoza MRN: [id number] Date of Birth: April 29, 1961 Referring Provider: Dr. Erlene Quan   Encounter Date: 03/27/2017  PT End of Session - 03/28/17 1334    Visit Number  1    Number of Visits  2    Date for PT Re-Evaluation  04/03/17    PT Start Time  1400    PT Stop Time  1510    PT Time Calculation (min)  70 min    Activity Tolerance  Patient tolerated treatment well    Behavior During Therapy  [facility] for tasks assessed/performed       Past Medical History:  Diagnosis Date  . Cancer [facility]) 2018   Prostate Cancer  . Diverticulosis   . GERD (gastroesophageal reflux disease)   . Hypercholesteremia     Past Surgical History:  Procedure Laterality Date  . COLONOSCOPY    . HERNIA REPAIR     10 years ago, [facility]    There were no vitals filed for this visit.   Subjective Assessment - 03/27/17 1435    Subjective  Pt is scheduled for prostate surgery on 04/08/17. Pt has no urinary leakage currently. Pt works as a Radiographer, therapeutic which involves repeated lifting boxes (50 lb) from the floor to table height, reaching overhead a heavy load while standing on a machine. Pt is concerned about quitting smoking because he states his stress leads him want a cigarette and he knows it is not good for him, especially after this Dx of CA. Pt has quit smoking in the past but has recently started it back up. The Dx of CA has caused him more stressed. Pt used to walk regularly but has stopped.       Pertinent History  Umbilical and above the pubic bone hernias ( with repair) . Interested in quitting smoking . Stress relief: walking in neighborhood , prayer, biking          [facility] PT Assessment - 03/28/17 0955      Assessment   Medical Diagnosis  Prostate Cancer    Referring Provider  Dr.  Erlene Quan      Precautions   Precautions  None      Restrictions   Weight Bearing Restrictions  No      Balance Screen   Has the patient fallen in the past 6 months  No      [place] residence      Bed Mobility   Bed Mobility  -- crunch method out of bed             Objective measurements completed on examination: See above findings.    Pelvic Floor Special Questions - 03/28/17 1000    Diastasis Recti  3 fingers below the sternum       [facility] Adult PT Treatment/Exercise - 03/28/17 1245      Neuro Re-ed    Neuro Re-ed Details   -- see pt. instructions             PT Education - 03/28/17 1002    Education provided  Yes    Education Details  POC, A&P, goals, HEP, smoking cessation, stress reduction    Person(s) Educated  Patient    Methods  Explanation;Demonstration;Tactile cues;Verbal cues;Handout    Comprehension  Verbalized understanding  PT Short Term Goals - 03/28/17 1342      PT SHORT TERM GOAL #1   Title  pt. demonstrates improvement in body mechanics with sit-to-stand and lifting.    Time  2    Period  Weeks    Status  New    Target Date  04/11/17      PT SHORT TERM GOAL #2   Title  pt. will demonstrate appropriate coordination of pelvic floor musculature with functional strength for improved outcomes post prostectomy and increased core stability for return to work    Time  2    Period  Weeks    Status  New    Target Date  04/11/17      PT SHORT TERM GOAL #3   Title  Pt. will demonstrate decreased abdominal bulging with leg movements to minimize strain on repaired hernias and pelvic floor.    Time  2    Period  Weeks    Target Date  04/11/17                Plan - 03/28/17 1337    Clinical Impression Statement  Pt is 56 yo male who is scheduled for prostatectomy on 04/08/17. Pt was referred to Pelvic [facility] PT to train his pelvic floor mm to yield better outcomes with continence  post-surgery. Pt 's clinical presentations included signs of poor intraabdominal pressure system which is associated increased risk for urinary incontinence: _diastasis recti ( 3 fingers width)  _dyscoordination of deep core mm _ poor body mechanics that place increased strain on his abdominal and pelvic floor mm with simulated work tasks: lifting, pulling, overhead reaching with simulated work tasks and with getting out of bed .  Following Tx today, pt demo'd proper body mechanics with work tasks and with getting out of bed and voiced understanding of how these movements can help him minimize his risk for hernias and pelvic floor dysfunction. Pt was provided motivational interviewing to help pt find stress-reduction strategies, quit smoking, and to return to walking for health and wellness. Pt voiced motivation to use techniques that have helped him in the past: walking and prayer to manage his stress and to quit smoking.   Plan to teach pt pelvic floor coordination and strengthening at next session before d/c.         History and Personal Factors relevant to plan of care:  Smoker, Hx. of 2 hernias, repeated lifting and overhead reaching heavy loads at his job    Clinical Presentation  Evolving    Clinical Decision Making  Low    Rehab Potential  Good    PT Frequency  1x / week    PT Duration  2 weeks    PT Treatment/Interventions  Patient/family education;Manual techniques;Therapeutic exercise;Therapeutic activities;Functional mobility training;Neuromuscular re-education    Consulted and Agree with Plan of Care  Patient       Patient will benefit from skilled therapeutic intervention in order to improve the following deficits and impairments:  Decreased coordination, Decreased strength, Decreased endurance, Improper body mechanics, Postural dysfunction  Visit Diagnosis: Other symptoms and signs involving the musculoskeletal system  Other lack of coordination  Diastasis of rectus  abdominis     Problem List Patient Active Problem List   Diagnosis Date Noted  . Vitamin D deficiency disease 03/14/2017  . Elevated PSA 07/18/2016  . Urinary hesitancy 07/18/2016    Jerl Mina ,PT, DPT, E-RYT  03/28/2017, 7:31 PM  [place] MAIN  [place], [place], [address] Phone: [phone]   Fax:  [phone]  Name: Donald Mendoza MRN: [id number] Date of Birth: September 18, 1960

## 2017-04-01 ENCOUNTER — Other Ambulatory Visit: Payer: Self-pay

## 2017-04-01 ENCOUNTER — Ambulatory Visit: Payer: BLUE CROSS/BLUE SHIELD | Admitting: Physical Therapy

## 2017-04-01 ENCOUNTER — Encounter
Admission: RE | Admit: 2017-04-01 | Discharge: 2017-04-01 | Disposition: A | Discharge status: Home / Self Care | Payer: BLUE CROSS/BLUE SHIELD | Source: Ambulatory Visit | Attending: Urology | Admitting: Urology

## 2017-04-01 DIAGNOSIS — Z803 Family history of malignant neoplasm of breast: Secondary | ICD-10-CM | POA: Insufficient documentation

## 2017-04-01 DIAGNOSIS — Z833 Family history of diabetes mellitus: Secondary | ICD-10-CM | POA: Diagnosis not present

## 2017-04-01 DIAGNOSIS — Z01812 Encounter for preprocedural laboratory examination: Secondary | ICD-10-CM | POA: Insufficient documentation

## 2017-04-01 DIAGNOSIS — R29898 Other symptoms and signs involving the musculoskeletal system: Secondary | ICD-10-CM | POA: Diagnosis not present

## 2017-04-01 DIAGNOSIS — M6208 Separation of muscle (nontraumatic), other site: Secondary | ICD-10-CM

## 2017-04-01 DIAGNOSIS — C61 Malignant neoplasm of prostate: Secondary | ICD-10-CM | POA: Diagnosis not present

## 2017-04-01 DIAGNOSIS — R278 Other lack of coordination: Secondary | ICD-10-CM

## 2017-04-01 LAB — URINALYSIS, ROUTINE W REFLEX MICROSCOPIC
BILIRUBIN URINE: NEGATIVE
Glucose, UA: NEGATIVE mg/dL
Hgb urine dipstick: NEGATIVE
KETONES UR: NEGATIVE mg/dL
Leukocytes, UA: NEGATIVE
NITRITE: NEGATIVE
Protein, ur: NEGATIVE mg/dL
Specific Gravity, Urine: 1.006 (ref 1.005–1.030)
pH: 5 (ref 5.0–8.0)

## 2017-04-01 LAB — PROTIME-INR
INR: 0.92
Prothrombin Time: 12.3 seconds (ref 11.4–15.2)

## 2017-04-01 LAB — CBC
HCT: 48.4 % (ref 40.0–52.0)
Hemoglobin: 15.9 g/dL (ref 13.0–18.0)
MCH: 29.8 pg (ref 26.0–34.0)
MCHC: 33 g/dL (ref 32.0–36.0)
MCV: 90.5 fL (ref 80.0–100.0)
PLATELETS: 66 10*3/uL — AB (ref 150–440)
RBC: 5.34 MIL/uL (ref 4.40–5.90)
RDW: 14.5 % (ref 11.5–14.5)
WBC: 7.1 10*3/uL (ref 3.8–10.6)

## 2017-04-01 LAB — BASIC METABOLIC PANEL
ANION GAP: 9 (ref 5–15)
BUN: 9 mg/dL (ref 6–20)
CALCIUM: 9.5 mg/dL (ref 8.9–10.3)
CO2: 26 mmol/L (ref 22–32)
CREATININE: 0.84 mg/dL (ref 0.61–1.24)
Chloride: 103 mmol/L (ref 101–111)
GLUCOSE: 94 mg/dL (ref 65–99)
Potassium: 3.7 mmol/L (ref 3.5–5.1)
Sodium: 138 mmol/L (ref 135–145)

## 2017-04-01 LAB — TYPE AND SCREEN
ABO/RH(D): B POS
ANTIBODY SCREEN: NEGATIVE

## 2017-04-01 NOTE — Therapy (Signed)
[place] MAIN [facility] SERVICES [address] [place], [place], [address] Phone: [phone]   Fax:  [phone]  Physical Therapy Treatment /Discharge Summary   Patient Details  Name: Donald Mendoza MRN: [id number] Date of Birth: 04-20-1961 Referring Provider: Dr. Erlene Quan   Encounter Date: 04/01/2017  PT End of Session - 04/01/17 1422    Visit Number  2    Number of Visits  2    Date for PT Re-Evaluation  04/03/17    PT Start Time  1310    PT Stop Time  1345    PT Time Calculation (min)  35 min    Activity Tolerance  Patient tolerated treatment well    Behavior During Therapy  [facility] for tasks assessed/performed       Past Medical History:  Diagnosis Date  . Cancer [facility]) 2018   Prostate Cancer  . Diverticulosis   . GERD (gastroesophageal reflux disease)   . Hypercholesteremia     Past Surgical History:  Procedure Laterality Date  . COLONOSCOPY    . HERNIA REPAIR     10 years ago, [facility]    There were no vitals filed for this visit.  Subjective Assessment - 04/01/17 1344    Subjective  Pt reported he has been practicing getting out of the bed with less straining    Pertinent History  Umbilical and above the pubic bone hernias ( with repair) . Interested in quitting smoking . Stress relief: walking in neighborhood , prayer, biking                    Pelvic Floor Special Questions - 04/01/17 1540    Diastasis Recti  2 fingers width    External Palpation  abdominal straining, excessive cues required. did not progress to endurance contractions due to difficulty with coordination.  Withheld deep core exercise due to poor coordination ( abdominal straining)  quick 10 repx 1 sec, hooklying. 3 reps 1 sec seated                 PT Education - 04/01/17 1542    Education provided  Yes    Education Details  HEP, d/c        PT Short Term Goals - 04/01/17 1542      PT SHORT TERM GOAL #1   Title  pt. demonstrates  improvement in body mechanics with sit-to-stand and lifting.    Time  2    Period  Weeks    Status  Achieved      PT SHORT TERM GOAL #2   Title  pt. will demonstrate appropriate coordination of pelvic floor musculature with functional strength for improved outcomes post prostectomy and increased core stability for return to work    Time  2    Period  Weeks    Status  Partially Met      PT SHORT TERM GOAL #3   Title  Pt. will demonstrate decreased abdominal bulging with leg movements to minimize strain on repaired hernias and pelvic floor.    Time  2    Period  Weeks    Status  Achieved               Plan - 04/01/17 1422    Clinical Impression Statement  Pt achieved 2/3 goals across the past 2 visits. Pt was partially met remainig goal as he demo'd improved coordination with pelvic floor contraction and less abdominal straining following excessive cues. Pt '  s abdominal separation has improved and he remains compliant with body mechanics that place less strain on his abdominal and pelvic floor mm. Pt was explained the details to pelvic floor strengthening to minimize the duration of urinary incontinence post -op. Pt demo'd correct pelvic floor technique but was not progressed to endurance exercises due to pt needing to master coordination first.  Pt would benefit from further PT following his surgery to improve posture, postural stability, and build up strength required to perform the increased lifting at work with less leakage. Pt is d/c at this time.     Rehab Potential  Good    PT Frequency  1x / week    PT Duration  2 weeks    PT Treatment/Interventions  Patient/family education;Manual techniques;Therapeutic exercise;Therapeutic activities;Functional mobility training;Neuromuscular re-education    Consulted and Agree with Plan of Care  Patient       Patient will benefit from skilled therapeutic intervention in order to improve the following deficits and impairments:  Decreased  coordination, Decreased strength, Decreased endurance, Improper body mechanics, Postural dysfunction  Visit Diagnosis: Other symptoms and signs involving the musculoskeletal system  Other lack of coordination  Diastasis of rectus abdominis     Problem List Patient Active Problem List   Diagnosis Date Noted  . Vitamin D deficiency disease 03/14/2017  . Elevated PSA 07/18/2016  . Urinary hesitancy 07/18/2016    Jerl Mina ,PT, DPT, E-RYT  04/01/2017, 3:49 PM  [place] MAIN [facility] SERVICES [address] [place], [place], [address] Phone: [phone]   Fax:  [phone]  Name: Donald Mendoza MRN: [id number] Date of Birth: 1961/04/22

## 2017-04-01 NOTE — Patient Instructions (Addendum)
PELVIC FLOOR / KEGEL EXERCISES   Pelvic floor/ Kegel exercises are used to strengthen the muscles in the base of your pelvis that are responsible for supporting your pelvic organs and preventing urine/feces leakage. Based on your therapist's recommendations, they can be performed while standing, sitting, or lying down.  Make yourself aware of this muscle group by using these cues:  Imagine you are in a crowded room and you feel the need to pass gas. Your response is to pull up and in at the rectum.  Close the rectum. Pull the muscles up inside your body,feeling your scrotum lifting as well . Feel the pelvic floor muscles lift as if you were walking into a cold lake.  Place your hand on top of your pubic bone. Tighten and draw in the muscles around the anal muscles without squeezing the buttock muscles.  Common Errors:  Breath holding: If you are holding your breath, you may be bearing down against your bladder instead of pulling it up. If you belly bulges up while you are squeezing, you are holding your breath. Be sure to breathe gently in and out while exercising. Counting out loud may help you avoid holding your breath.  Accessory muscle use: You should not see or feel other muscle movement when performing pelvic floor exercises. When done properly, no one can tell that you are performing the exercises. Keep the buttocks, belly and inner thighs relaxed.  Overdoing it: Your muscles can fatigue and stop working for you if you over-exercise. You may actually leak more or feel soreness at the lower abdomen or rectum.  YOUR HOME EXERCISE PROGRAM   SHORT HOLDS: Position: on back  Inhale and then exhale. Then squeeze the muscle.  (Be sure to let belly sink in with exhales and not push outward) Perform 10 repetitions, 4  Times/day ( warming up the car ( reclined back), lunch, after leaving work in the car, before bed)    When sitting with feet on ground, back tall, do 3 quick contractions , 5  different times during the day     ____  While catheter is in:  no pelvic floor squeezes    After surgery and catheter is removed: Resume pelvic floor    _____ Quick squeeze with getting up out of the chair

## 2017-04-01 NOTE — Patient Instructions (Signed)
Your procedure is scheduled on: Monday 04/08/17 Report to [facility]. 2ND FLOOR MEDICAL MALL ENTRANCE. To find out your arrival time please call [phone] between 1PM - 3PM on Friday 04/05/17.  Remember: Instructions that are not followed completely may result in serious medical risk, up to and including death, or upon the discretion of your surgeon and anesthesiologist your surgery may need to be rescheduled.    __X__ 1. Do not eat anything after midnight the night before your    procedure.  No gum chewing or hard candies.  You may drink clear   liquids up to 2 hours before you are scheduled to arrive at the   hospital for your procedure. Do not drink clear liquids within 2   hours of scheduled arrival to the hospital as this may lead to your   procedure being delayed or rescheduled.       Clear liquids include:   Water or Apple juice without pulp   Clear carbohydrate beverage such as Clearfast or Gatorade   Black coffee or Clear Tea (no milk, no creamer, do not add anything   to the coffee or tea)    Diabetics should only drink water   __X__ 2. No Alcohol for 24 hours before or after surgery.   ____ 3. Bring all medications with you on the day of surgery if instructed.    __X__ 4. Notify your doctor if there is any change in your medical condition     (cold, fever, infections).             __X___5. No smoking within 24 hours of your surgery.     Do not wear jewelry, make-up, hairpins, clips or nail polish.  Do not wear lotions, powders, or perfumes.   Do not shave 48 hours prior to surgery. Men may shave face and neck.  Do not bring valuables to the hospital.    [facility] is not responsible for any belongings or valuables.               Contacts, dentures or bridgework may not be worn into surgery.  Leave your suitcase in the car. After surgery it may be brought to your room.  For patients admitted to the hospital, discharge time is determined by your                 treatment team.   Patients discharged the day of surgery will not be allowed to drive home.   Please read over the following fact sheets that you were given:   MRSA Information   __X__ Take these medicines the morning of surgery with A SIP OF WATER:    1. PRAVASTATIN  2.   3.   4.  5.  6.  ____ Fleet Enema (as directed)   __X__ Use CHG Soap/SAGE wipes as directed  ____ Use inhalers on the day of surgery  ____ Stop metformin 2 days prior to surgery    ____ Take 1/2 of usual insulin dose the night before surgery and none on the morning of surgery.   ____ Stop Coumadin/Plavix/aspirin on   __X__ Stop Anti-inflammatories such as Advil, Aleve, Ibuprofen, Motrin, Naproxen, Naprosyn, Goodies,powder, or aspirin products.  OK to take Tylenol.   __X__ Stop supplements, Vitamin E, Fish Oil until after surgery.    ____ Bring C-Pap to the hospital.

## 2017-04-02 LAB — URINE CULTURE: CULTURE: NO GROWTH

## 2017-04-05 DIAGNOSIS — D696 Thrombocytopenia, unspecified: Secondary | ICD-10-CM | POA: Diagnosis not present

## 2017-04-05 DIAGNOSIS — E119 Type 2 diabetes mellitus without complications: Secondary | ICD-10-CM | POA: Diagnosis not present

## 2017-04-05 DIAGNOSIS — C61 Malignant neoplasm of prostate: Secondary | ICD-10-CM | POA: Diagnosis not present

## 2017-04-05 DIAGNOSIS — E785 Hyperlipidemia, unspecified: Secondary | ICD-10-CM | POA: Diagnosis not present

## 2017-04-07 MED ORDER — CEFAZOLIN SODIUM-DEXTROSE 2-4 GM/100ML-% IV SOLN: 2.0000 g | INTRAVENOUS | Status: DC

## 2017-04-08 ENCOUNTER — Inpatient Hospital Stay: Payer: BLUE CROSS/BLUE SHIELD | Admitting: Certified Registered Nurse Anesthetist

## 2017-04-08 ENCOUNTER — Encounter: Payer: Self-pay | Admitting: *Deleted

## 2017-04-08 ENCOUNTER — Ambulatory Visit
Admission: RE | Admit: 2017-04-08 | Discharge: 2017-04-08 | Disposition: A | Discharge status: Home / Self Care | Payer: BLUE CROSS/BLUE SHIELD | Source: Ambulatory Visit | Attending: Urology | Admitting: Urology

## 2017-04-08 ENCOUNTER — Encounter
Admission: RE | Disposition: A | Discharge status: Home / Self Care | Payer: Self-pay | Source: Ambulatory Visit | Attending: Urology

## 2017-04-08 ENCOUNTER — Telehealth: Payer: Self-pay | Admitting: Urology

## 2017-04-08 DIAGNOSIS — E7800 Pure hypercholesterolemia, unspecified: Secondary | ICD-10-CM | POA: Insufficient documentation

## 2017-04-08 DIAGNOSIS — Z5309 Procedure and treatment not carried out because of other contraindication: Secondary | ICD-10-CM | POA: Insufficient documentation

## 2017-04-08 DIAGNOSIS — Z87891 Personal history of nicotine dependence: Secondary | ICD-10-CM | POA: Insufficient documentation

## 2017-04-08 DIAGNOSIS — C61 Malignant neoplasm of prostate: Secondary | ICD-10-CM | POA: Diagnosis not present

## 2017-04-08 DIAGNOSIS — D696 Thrombocytopenia, unspecified: Secondary | ICD-10-CM

## 2017-04-08 LAB — CBC
HCT: 42.9 % (ref 40.0–52.0)
Hemoglobin: 14.1 g/dL (ref 13.0–18.0)
MCH: 29.7 pg (ref 26.0–34.0)
MCHC: 33 g/dL (ref 32.0–36.0)
MCV: 90 fL (ref 80.0–100.0)
PLATELETS: 62 10*3/uL — AB (ref 150–440)
RBC: 4.77 MIL/uL (ref 4.40–5.90)
RDW: 14.2 % (ref 11.5–14.5)
WBC: 5.2 10*3/uL (ref 3.8–10.6)

## 2017-04-08 LAB — ABO/RH: ABO/RH(D): B POS

## 2017-04-08 SURGERY — ROBOTIC ASSISTED LAPAROSCOPIC RADICAL PROSTATECTOMY
Anesthesia: Choice

## 2017-04-08 MED ORDER — FAMOTIDINE 20 MG PO TABS
20.0000 mg | ORAL_TABLET | Freq: Once | ORAL | Status: AC
  Administered 2017-04-08: 20 mg via ORAL

## 2017-04-08 MED ORDER — ROCURONIUM BROMIDE 50 MG/5ML IV SOLN
INTRAVENOUS | Status: AC
  Filled 2017-04-08: qty 1

## 2017-04-08 MED ORDER — PHENYLEPHRINE HCL 10 MG/ML IJ SOLN
INTRAMUSCULAR | Status: AC
  Filled 2017-04-08: qty 1

## 2017-04-08 MED ORDER — LACTATED RINGERS IV SOLN
INTRAVENOUS | Status: DC
  Administered 2017-04-08: 75 mL/h via INTRAVENOUS

## 2017-04-08 MED ORDER — MIDAZOLAM HCL 2 MG/2ML IJ SOLN
INTRAMUSCULAR | Status: AC
  Filled 2017-04-08: qty 2

## 2017-04-08 MED ORDER — FAMOTIDINE 20 MG PO TABS
ORAL_TABLET | ORAL | Status: AC
  Administered 2017-04-08: 20 mg via ORAL
  Filled 2017-04-08: qty 1

## 2017-04-08 MED ORDER — LIDOCAINE HCL (PF) 2 % IJ SOLN
INTRAMUSCULAR | Status: AC
  Filled 2017-04-08: qty 10

## 2017-04-08 MED ORDER — FENTANYL CITRATE (PF) 100 MCG/2ML IJ SOLN
INTRAMUSCULAR | Status: AC
  Filled 2017-04-08: qty 2

## 2017-04-08 MED ORDER — CEFAZOLIN SODIUM-DEXTROSE 2-4 GM/100ML-% IV SOLN
INTRAVENOUS | Status: AC
  Filled 2017-04-08: qty 100

## 2017-04-08 MED ORDER — PROPOFOL 10 MG/ML IV BOLUS
INTRAVENOUS | Status: AC
  Filled 2017-04-08: qty 20

## 2017-04-08 SURGICAL SUPPLY — 97 items
ANCHOR TIS RET SYS 235ML (MISCELLANEOUS) IMPLANT
APPLICATOR SURGIFLO ENDO (HEMOSTASIS) ×3 IMPLANT
APPLIER CLIP LOGIC TI 5 (MISCELLANEOUS) IMPLANT
BAG URO DRAIN 2000ML W/SPOUT (MISCELLANEOUS) ×3 IMPLANT
BLADE CLIPPER SURG (BLADE) ×3 IMPLANT
BULB RESERV EVAC DRAIN JP 100C (MISCELLANEOUS) IMPLANT
CANISTER SUCT 1200ML W/VALVE (MISCELLANEOUS) ×3 IMPLANT
CATH DRAINAGE MALECOT 26FR (CATHETERS) ×2 IMPLANT
CATH FOL 2WAY LX 18X5 (CATHETERS) ×3 IMPLANT
CATH MALECOT (CATHETERS) ×3
CHLORAPREP W/TINT 26ML (MISCELLANEOUS) ×6 IMPLANT
CLIP SUT LAPRA TY ABSORB (SUTURE) IMPLANT
CLIP VESOLOCK LG 6/CT PURPLE (CLIP) ×6 IMPLANT
CORD BIP STRL DISP 12FT (MISCELLANEOUS) ×3 IMPLANT
CORD MONOPOLAR M/FML 12FT (MISCELLANEOUS) ×3 IMPLANT
COVER TIP SHEARS 8 DVNC (MISCELLANEOUS) ×2 IMPLANT
COVER TIP SHEARS 8MM DA VINCI (MISCELLANEOUS) ×1
DEFOGGER SCOPE WARMER CLEARIFY (MISCELLANEOUS) ×3 IMPLANT
DERMABOND ADVANCED (GAUZE/BANDAGES/DRESSINGS) ×1
DERMABOND ADVANCED .7 DNX12 (GAUZE/BANDAGES/DRESSINGS) ×2 IMPLANT
DRAIN CHANNEL JP 15F RND 16 (MISCELLANEOUS) IMPLANT
DRAIN CHANNEL JP 19F (MISCELLANEOUS) IMPLANT
DRAPE LEGGINS SURG 28X43 STRL (DRAPES) ×3 IMPLANT
DRAPE SHEET LG 3/4 BI-LAMINATE (DRAPES) ×6 IMPLANT
DRAPE SURG 17X11 SM STRL (DRAPES) ×12 IMPLANT
DRAPE TABLE BACK 80X90 (DRAPES) ×3 IMPLANT
DRAPE UNDER BUTTOCK W/FLU (DRAPES) ×3 IMPLANT
DRIVER LRG NEEDLE DA VINCI (INSTRUMENTS) ×2
DRIVER NDLE LRG DVNC (INSTRUMENTS) ×4 IMPLANT
DRSG TELFA 3X8 NADH (GAUZE/BANDAGES/DRESSINGS) ×3 IMPLANT
ELECT REM PT RETURN 9FT ADLT (ELECTROSURGICAL) ×3
ELECTRODE REM PT RTRN 9FT ADLT (ELECTROSURGICAL) ×2 IMPLANT
FILTER LAP SMOKE EVAC STRL (MISCELLANEOUS) IMPLANT
FORCEPS MARYLAND BIPOLAR 8X55 (INSTRUMENTS) ×1
FORCEPS MRYLND BPLR 8X55 DVNC (INSTRUMENTS) ×2 IMPLANT
GLOVE BIO SURGEON STRL SZ 6.5 (GLOVE) ×9 IMPLANT
GOWN STRL REUS W/ TWL LRG LVL3 (GOWN DISPOSABLE) ×6 IMPLANT
GOWN STRL REUS W/TWL LRG LVL3 (GOWN DISPOSABLE) ×3
GRASPER SUT TROCAR 14GX15 (MISCELLANEOUS) ×3 IMPLANT
HEMOSTAT SURGICEL 2X14 (HEMOSTASIS) IMPLANT
HOLDER FOLEY CATH W/STRAP (MISCELLANEOUS) ×3 IMPLANT
IRRIGATION STRYKERFLOW (MISCELLANEOUS) ×2 IMPLANT
IRRIGATOR STRYKERFLOW (MISCELLANEOUS) ×3
IV LACTATED RINGERS 1000ML (IV SOLUTION) ×3 IMPLANT
JELLY LUB 2OZ STRL (MISCELLANEOUS)
JELLY LUBE 2OZ STRL (MISCELLANEOUS) IMPLANT
KIT ACCESSORY DA VINCI DISP (KITS) ×1
KIT ACCESSORY DVNC DISP (KITS) ×2 IMPLANT
KIT PINK PAD W/HEAD ARE REST (MISCELLANEOUS) ×3
KIT PINK PAD W/HEAD ARM REST (MISCELLANEOUS) ×2 IMPLANT
LABEL OR SOLS (LABEL) ×3 IMPLANT
MARKER SKIN DUAL TIP RULER LAB (MISCELLANEOUS) ×3 IMPLANT
NEEDLE HYPO 22GX1.5 SAFETY (NEEDLE) ×3 IMPLANT
NEEDLE INSUFFLATION 14GA 120MM (NEEDLE) ×3 IMPLANT
NS IRRIG 500ML POUR BTL (IV SOLUTION) ×3 IMPLANT
PACK LAP CHOLECYSTECTOMY (MISCELLANEOUS) ×3 IMPLANT
PENCIL ELECTRO HAND CTR (MISCELLANEOUS) ×3 IMPLANT
PROGRASP ENDOWRIST DA VINCI (INSTRUMENTS) ×1
PROGRASP ENDOWRIST DVNC (INSTRUMENTS) ×2 IMPLANT
RELOAD STAPLER WHITE 60MM (STAPLE) IMPLANT
SCISSORS METZENBAUM CVD 33 (INSTRUMENTS) IMPLANT
SLEEVE ENDOPATH XCEL 5M (ENDOMECHANICALS) ×6 IMPLANT
SOLUTION ELECTROLUBE (MISCELLANEOUS) ×3 IMPLANT
SPOGE SURGIFLO 8M (HEMOSTASIS) ×1
SPONGE LAP 4X18 5PK (MISCELLANEOUS) IMPLANT
SPONGE SURGIFLO 8M (HEMOSTASIS) ×2 IMPLANT
SPONGE VERSALON 4X4 4PLY (MISCELLANEOUS) ×3 IMPLANT
STAPLE ECHEON FLEX 60 POW ENDO (STAPLE) IMPLANT
STAPLER RELOAD WHITE 60MM (STAPLE)
STAPLER SKIN PROX 35W (STAPLE) ×3 IMPLANT
STRAP SAFETY BODY (MISCELLANEOUS) ×3 IMPLANT
SUT DVC VLOC 90 3-0 CV23 UNDY (SUTURE) ×3 IMPLANT
SUT DVC VLOC 90 3-0 CV23 VLT (SUTURE) ×3
SUT ETHILON 3-0 FS-10 30 BLK (SUTURE)
SUT MNCRL 4-0 (SUTURE) ×2
SUT MNCRL 4-0 27XMFL (SUTURE) ×4
SUT PROLENE 5 0 RB 1 DA (SUTURE) IMPLANT
SUT SILK 2 0 SH (SUTURE) ×3 IMPLANT
SUT VIC AB 0 CT1 36 (SUTURE) ×6 IMPLANT
SUT VIC AB 2-0 CT1 (SUTURE) ×6 IMPLANT
SUT VIC AB 2-0 SH 27 (SUTURE) ×2
SUT VIC AB 2-0 SH 27XBRD (SUTURE) ×4 IMPLANT
SUT VICRYL 0 AB UR-6 (SUTURE) ×3 IMPLANT
SUTURE DVC VLC 90 3-0 CV23 VLT (SUTURE) ×2 IMPLANT
SUTURE EHLN 3-0 FS-10 30 BLK (SUTURE) IMPLANT
SUTURE MNCRL 4-0 27XMF (SUTURE) ×4 IMPLANT
SYR BULB IRRIG 60ML STRL (SYRINGE) ×3 IMPLANT
SYRINGE 10CC LL (SYRINGE) ×3 IMPLANT
SYRINGE IRR TOOMEY STRL 70CC (SYRINGE) IMPLANT
TAPE CLOTH 10X20 WHT NS LF (TAPE) ×4 IMPLANT
TAPE CLOTH 2X10 WHT NS LF (TAPE) ×2
TROCAR DISP BLADELESS 8 DVNC (TROCAR) ×2 IMPLANT
TROCAR DISP BLADELESS 8MM (TROCAR) ×1
TROCAR ENDOPATH XCEL 12X100 BL (ENDOMECHANICALS) ×6 IMPLANT
TROCAR XCEL 12X100 BLDLESS (ENDOMECHANICALS) ×3 IMPLANT
TROCAR XCEL NON-BLD 5MMX100MML (ENDOMECHANICALS) ×3 IMPLANT
TUBING INSUFFLATOR HI FLOW (MISCELLANEOUS) ×3 IMPLANT

## 2017-04-08 NOTE — Anesthesia Preprocedure Evaluation (Addendum)
Anesthesia Evaluation  Patient identified by MRN, date of birth, ID band Patient awake    Reviewed: Allergy & Precautions, H&P , NPO status , Patient's Chart, lab work & pertinent test results, reviewed documented beta blocker date and time   Airway Mallampati: II  TM Distance: >3 FB Neck ROM: full    Dental  (+) Teeth Intact   Pulmonary neg pulmonary ROS, former smoker,    Pulmonary exam normal        Cardiovascular negative cardio ROS Normal cardiovascular exam Rhythm:regular Rate:Normal     Neuro/Psych negative neurological ROS  negative psych ROS   GI/Hepatic negative GI ROS, Neg liver ROS, GERD  Medicated,  Endo/Other  negative endocrine ROS  Renal/GU negative Renal ROS  negative genitourinary   Musculoskeletal   Abdominal   Peds  Hematology negative hematology ROS (+)   Anesthesia Other Findings Past Medical History: 2018: Cancer ([place])     Comment:  Prostate Cancer No date: Diverticulosis No date: GERD (gastroesophageal reflux disease) No date: Hypercholesteremia Past Surgical History: No date: COLONOSCOPY No date: HERNIA REPAIR     Comment:  10 years ago, [facility]. umbilical hernia x 2 BMI    Body Mass Index:  24.90 kg/m     Reproductive/Obstetrics negative OB ROS                             Anesthesia Physical Anesthesia Plan  ASA: II  Anesthesia Plan: General ETT   Post-op Pain Management:    Induction:   PONV Risk Score and Plan: 3  Airway Management Planned:   Additional Equipment:   Intra-op Plan:   Post-operative Plan:   Informed Consent: I have reviewed the patients History and Physical, chart, labs and discussed the procedure including the risks, benefits and alternatives for the proposed anesthesia with the patient or authorized representative who has indicated his/her understanding and acceptance.   Dental Advisory Given  Plan Discussed with:  CRNA  Anesthesia Plan Comments: (Repeat platelet count requested for confirmation.  No previous hx noted. JA)      Anesthesia Quick Evaluation

## 2017-04-08 NOTE — OR Nursing (Signed)
Surgery cancelled due to low platelet level.  Dr. Cherrie Gauze office to set up referrals to hematology and reschedule.

## 2017-04-08 NOTE — Telephone Encounter (Signed)
Surgery canceled today secondary to thrombocytopenia.  On preop labs, his platelets were 66, repeated a 62.  He does admit to having a personal history of low platelets but has never had a workup for this.   Surgery deferred today with plan for hematological evaluation.  Referral order placed.  We will reschedule surgery for able to get his platelets up to a reasonable number.  If not, radiation may be a more appropriate treatment option.  Hollice Espy, MD

## 2017-04-09 ENCOUNTER — Telehealth: Payer: Self-pay

## 2017-04-09 NOTE — Telephone Encounter (Signed)
Pt called very upset stating his surgery was cancelled yesterday due to his platelet counts. Pt stated that he doesn't understand why his appt with the cancer center is so far out. Pt appt is 04/22/17. Made pt aware that Dr. Erlene Quan placed the referral and I did not see any notes as to how soon he should be seen. Made pt aware he should call cancer center and see if he can move his appt up. Pt voiced understanding.

## 2017-04-10 ENCOUNTER — Encounter: Payer: Self-pay | Admitting: Oncology

## 2017-04-10 ENCOUNTER — Inpatient Hospital Stay: Payer: BLUE CROSS/BLUE SHIELD

## 2017-04-10 ENCOUNTER — Other Ambulatory Visit: Payer: Self-pay

## 2017-04-10 ENCOUNTER — Inpatient Hospital Stay: Payer: BLUE CROSS/BLUE SHIELD | Attending: Oncology | Admitting: Oncology

## 2017-04-10 VITALS — BP 99/55 | HR 72 | Temp 95.6°F | Wt 159.0 lb

## 2017-04-10 DIAGNOSIS — E7800 Pure hypercholesterolemia, unspecified: Secondary | ICD-10-CM | POA: Diagnosis not present

## 2017-04-10 DIAGNOSIS — F17210 Nicotine dependence, cigarettes, uncomplicated: Secondary | ICD-10-CM

## 2017-04-10 DIAGNOSIS — Z79899 Other long term (current) drug therapy: Secondary | ICD-10-CM

## 2017-04-10 DIAGNOSIS — Z803 Family history of malignant neoplasm of breast: Secondary | ICD-10-CM

## 2017-04-10 DIAGNOSIS — R634 Abnormal weight loss: Secondary | ICD-10-CM | POA: Diagnosis not present

## 2017-04-10 DIAGNOSIS — K219 Gastro-esophageal reflux disease without esophagitis: Secondary | ICD-10-CM

## 2017-04-10 DIAGNOSIS — D696 Thrombocytopenia, unspecified: Secondary | ICD-10-CM

## 2017-04-10 DIAGNOSIS — Z8719 Personal history of other diseases of the digestive system: Secondary | ICD-10-CM

## 2017-04-10 DIAGNOSIS — C61 Malignant neoplasm of prostate: Secondary | ICD-10-CM | POA: Diagnosis not present

## 2017-04-10 LAB — COMPREHENSIVE METABOLIC PANEL
ALBUMIN: 4.3 g/dL (ref 3.5–5.0)
ALT: 18 U/L (ref 17–63)
ANION GAP: 9 (ref 5–15)
AST: 23 U/L (ref 15–41)
Alkaline Phosphatase: 115 U/L (ref 38–126)
BUN: 13 mg/dL (ref 6–20)
CHLORIDE: 106 mmol/L (ref 101–111)
CO2: 27 mmol/L (ref 22–32)
Calcium: 9.3 mg/dL (ref 8.9–10.3)
Creatinine, Ser: 0.69 mg/dL (ref 0.61–1.24)
GFR calc Af Amer: >60 mL/min (ref >60)
GFR calc non Af Amer: >60 mL/min (ref >60)
GLUCOSE: 112 mg/dL — AB (ref 65–99)
POTASSIUM: 3.7 mmol/L (ref 3.5–5.1)
SODIUM: 142 mmol/L (ref 135–145)
TOTAL PROTEIN: 7.5 g/dL (ref 6.5–8.1)
Total Bilirubin: 0.5 mg/dL (ref 0.3–1.2)

## 2017-04-10 LAB — CBC WITH DIFFERENTIAL/PLATELET
BASOS ABS: 0 10*3/uL (ref 0–0.1)
Basophils Relative: 1 %
EOS PCT: 2 %
Eosinophils Absolute: 0.1 10*3/uL (ref 0–0.7)
HEMATOCRIT: 44.4 % (ref 40.0–52.0)
Hemoglobin: 14.9 g/dL (ref 13.0–18.0)
LYMPHS ABS: 2 10*3/uL (ref 1.0–3.6)
LYMPHS PCT: 39 %
MCH: 30.4 pg (ref 26.0–34.0)
MCHC: 33.5 g/dL (ref 32.0–36.0)
MCV: 90.7 fL (ref 80.0–100.0)
MONO ABS: 0.4 10*3/uL (ref 0.2–1.0)
Monocytes Relative: 8 %
NEUTROS ABS: 2.6 10*3/uL (ref 1.4–6.5)
Neutrophils Relative %: 50 %
PLATELETS: 84 10*3/uL — AB (ref 150–400)
RBC: 4.9 MIL/uL (ref 4.40–5.90)
RDW: 14.7 % — AB (ref 11.5–14.5)
WBC: 5.2 10*3/uL (ref 3.8–10.6)

## 2017-04-10 LAB — TECHNOLOGIST SMEAR REVIEW

## 2017-04-10 LAB — VITAMIN B12: VITAMIN B 12: 165 pg/mL — AB (ref 180–914)

## 2017-04-10 LAB — FOLATE: FOLATE: 14.4 ng/mL (ref >5.9)

## 2017-04-10 LAB — LACTATE DEHYDROGENASE: LDH: 155 U/L (ref 98–192)

## 2017-04-10 NOTE — Telephone Encounter (Signed)
Patient called today.  He was able to get an appointment in the cancer center for his hematological evaluation today at 2pm.  He wanted to let you know.

## 2017-04-10 NOTE — Progress Notes (Signed)
Hematology/Oncology Consult note [facility] Telephone:([phone] [phone]   Patient Care Team: Ronnell Freshwater, NP as PCP - General (Family Medicine) Christie Nottingham, PA as Referring Physician (Physician Assistant) Christene Lye, MD (General Surgery)  REFERRING PROVIDER: Dr.Brandon  CHIEF COMPLAINTS/PURPOSE OF CONSULTATION:  Evaluation of thrombocytopenia.  HISTORY OF PRESENTING ILLNESS:  Donald Mendoza is a  56 y.o.  male with PMH listed below who was referred to me for evaluation of thrombocytopenia. Patient was recently diagnosed with intermediate risk prostate cancer in the follows up with Dr. Erlene Quan. Initially he was diagnosed with a low-risk prostate cancer Gleason score 3+3 in March 2018 when his PSA was 4. PSA continued to rise to 5.2 and patient had a repeat prostate biopsy revealing Gleason score 4+3. Patient was scheduled for radical prostatectomy however was found to have thrombocytopenia. Referred to [place] to evaluation for the low platelet count. Patient reports that he has been told that his platelet has been low in the past. He never received any treatment.  He used to drink hard liquor on weekends but has quit for many years. He was former smoker, quit recently. Reports some weight loss, but otherwise feeling well. Denies any bleeding events.   Review of Systems  Constitutional: Positive for weight loss. Negative for chills and fever.  HENT: Negative for hearing loss.   Eyes: Negative for blurred vision.  Respiratory: Negative for cough.   Cardiovascular: Negative for chest pain.  Gastrointestinal: Negative for heartburn.  Genitourinary: Negative for dysuria.  Skin: Negative for rash.  Neurological: Negative for dizziness.  Endo/Heme/Allergies: Does not bruise/bleed easily.  Psychiatric/Behavioral: Negative for depression.    MEDICAL HISTORY:  Past Medical History:  Diagnosis Date  . Cancer [facility]) 2018   Prostate  Cancer  . Diverticulosis   . GERD (gastroesophageal reflux disease)   . Hypercholesteremia     SURGICAL HISTORY: Past Surgical History:  Procedure Laterality Date  . COLONOSCOPY    . HERNIA REPAIR     10 years ago, [place]. umbilical hernia x 2    SOCIAL HISTORY: Social History   Socioeconomic History  . Marital status: Single    Spouse name: Not on file  . Number of children: Not on file  . Years of education: Not on file  . Highest education level: Not on file  Social Needs  . Financial resource strain: Not on file  . Food insecurity - worry: Not on file  . Food insecurity - inability: Not on file  . Transportation needs - medical: Not on file  . Transportation needs - non-medical: Not on file  Occupational History  . Not on file  Tobacco Use  . Smoking status: Former Smoker    Packs/day: 0.50    Years: 34.00    Pack years: 17.00    Types: Cigarettes    Last attempt to quit: 04/06/2017    Years since quitting: 0.0  . Smokeless tobacco: Never Used  Substance and Sexual Activity  . Alcohol use: Yes    Alcohol/week: 1.2 oz    Types: 2 Cans of beer per week  . Drug use: No  . Sexual activity: Yes  Other Topics Concern  . Not on file  Social History Narrative  . Not on file    FAMILY HISTORY: Family History  Problem Relation Age of Onset  . Diabetes Mother   . Breast cancer Mother   . Breast cancer Sister     ALLERGIES:  is allergic to  sulfa antibiotics.  MEDICATIONS:  No current facility-administered medications for this visit.    No current outpatient medications on file.   Facility-Administered Medications Ordered in Other Visits  Medication Dose Route Frequency Provider Last Rate Last Dose  . ceFAZolin (ANCEF) IVPB 2g/100 mL premix  2 g Intravenous 30 min Pre-Op Hollice Espy, MD      . lactated ringers infusion   Intravenous Continuous Piscitello, Precious Haws, MD 75 mL/hr at 04/08/17 0850 75 mL/hr at 04/08/17 0850     PHYSICAL EXAMINATION: ECOG  PERFORMANCE STATUS: 0 - Asymptomatic Vitals:   04/10/17 1701  BP: (!) 99/55  Pulse: 72  Temp: (!) 95.6 F (35.3 C)   Filed Weights   04/10/17 1701  Weight: 159 lb (72.1 kg)    GENERAL: No distress, well nourished.  SKIN:  No rashes or significant lesions  HEAD: Normocephalic, No masses, lesions, tenderness or abnormalities  EYES: Conjunctiva are pink, non icteric ENT: External ears normal ,lips , buccal mucosa, and tongue normal and mucous membranes are moist  LYMPH: No palpable cervical and axillary lymphadenopathy  LUNGS: Clear to auscultation, no crackles or wheezes HEART: Regular rate & rhythm, no murmurs, no gallops, S1 normal and S2 normal  ABDOMEN: Abdomen soft, non-tender, normal bowel sounds, I did not appreciate any  masses or organomegaly  MUSCULOSKELETAL: No CVA tenderness and no tenderness on percussion of the back or rib cage.  EXTREMITIES: No edema, no skin discoloration or tenderness. He has some chronic varicose veins on his left lower extremity. NEURO: Alert & oriented, no focal motor/sensory deficits.    LABORATORY DATA:  I have reviewed the data as listed Lab Results  Component Value Date   WBC 5.2 04/10/2017   HGB 14.9 04/10/2017   HCT 44.4 04/10/2017   MCV 90.7 04/10/2017   PLT 84 (L) 04/10/2017   Recent Labs    04/01/17 1518  NA 138  K 3.7  CL 103  CO2 26  GLUCOSE 94  BUN 9  CREATININE 0.84  CALCIUM 9.5  GFRNONAA >60  GFRAA >60       ASSESSMENT & PLAN:  1. Thrombocytopenia ([place])    For the work up of patient's thrombocytopenia, I recommend checking CBC;CMP, LDH; pathology smear review, folate, Vitamin B12, hepatitis, HIV and monoclonal gammopathy workup. Will also check ultrasound of the abdomen.  Also, discussed with the patient that if no clear etiology found- bone marrow biopsy would be suggested. Currently await for the above workup.   # Patient follow-up with me in approximately 1 week to review the above results.  All  questions were answered. The patient knows to call the clinic with any problems questions or concerns.  Return of visit:  Thank you for this kind referral and the opportunity to participate in the care of this patient. A copy of today's note is routed to referring provider    Earlie Server, MD, PhD Hematology Oncology [facility] at [facility] Pager- [phone] 04/10/2017

## 2017-04-11 LAB — PROTEIN ELECTROPHORESIS, SERUM
A/G Ratio: 1.4 (ref 0.7–1.7)
ALBUMIN ELP: 3.8 g/dL (ref 2.9–4.4)
ALPHA-1-GLOBULIN: 0.2 g/dL (ref 0.0–0.4)
ALPHA-2-GLOBULIN: 0.5 g/dL (ref 0.4–1.0)
Beta Globulin: 0.9 g/dL (ref 0.7–1.3)
GLOBULIN, TOTAL: 2.8 g/dL (ref 2.2–3.9)
Gamma Globulin: 1.2 g/dL (ref 0.4–1.8)
Total Protein ELP: 6.6 g/dL (ref 6.0–8.5)

## 2017-04-11 LAB — HIV ANTIBODY (ROUTINE TESTING W REFLEX): HIV SCREEN 4TH GENERATION: NONREACTIVE

## 2017-04-11 LAB — HEPATITIS PANEL, ACUTE
HCV Ab: <0.1 s/co ratio (ref 0.0–0.9)
HEP B S AG: NEGATIVE
Hep A IgM: NEGATIVE
Hep B C IgM: NEGATIVE

## 2017-04-12 LAB — KAPPA/LAMBDA LIGHT CHAINS
KAPPA FREE LGHT CHN: 17.2 mg/L (ref 3.3–19.4)
Kappa, lambda light chain ratio: 1.23 (ref 0.26–1.65)
LAMDA FREE LIGHT CHAINS: 14 mg/L (ref 5.7–26.3)

## 2017-04-15 ENCOUNTER — Ambulatory Visit: Payer: BLUE CROSS/BLUE SHIELD

## 2017-04-15 LAB — COMP PANEL: LEUKEMIA/LYMPHOMA

## 2017-04-18 ENCOUNTER — Ambulatory Visit
Admit: 2017-04-18 | Discharge: 2017-04-18 | Disposition: A | Discharge status: Home / Self Care | Payer: BLUE CROSS/BLUE SHIELD | Attending: Oncology | Admitting: Oncology

## 2017-04-18 DIAGNOSIS — D696 Thrombocytopenia, unspecified: Secondary | ICD-10-CM | POA: Diagnosis not present

## 2017-04-18 DIAGNOSIS — K820 Obstruction of gallbladder: Secondary | ICD-10-CM | POA: Insufficient documentation

## 2017-04-21 ENCOUNTER — Other Ambulatory Visit: Payer: Self-pay | Admitting: Oncology

## 2017-04-21 ENCOUNTER — Encounter: Payer: Self-pay | Admitting: Oncology

## 2017-04-21 DIAGNOSIS — E538 Deficiency of other specified B group vitamins: Secondary | ICD-10-CM | POA: Insufficient documentation

## 2017-04-21 HISTORY — DX: Deficiency of other specified B group vitamins: E53.8

## 2017-04-21 NOTE — Progress Notes (Signed)
Hematology/Oncology  Follow up note [facility] [phone] [phone]   Patient Care Team: Ronnell Freshwater, NP as PCP - General (Family Medicine) Christie Nottingham, PA as Referring Physician (Physician Assistant) Christene Lye, MD (General Surgery)  PURPOSE OF CONSULTATION:  Follow up of management of thrombocytopenia.  HISTORY OF PRESENTING ILLNESS:  Donald Mendoza is a  56 y.o.  male with PMH listed below who was referred to me for evaluation of thrombocytopenia. Patient was recently diagnosed with intermediate risk prostate cancer in the follows up with Dr. Erlene Quan. Initially he was diagnosed with a low-risk prostate cancer Gleason score 3+3 in March 2018 when his PSA was 4. PSA continued to rise to 5.2 and patient had a repeat prostate biopsy revealing Gleason score 4+3. Patient was scheduled for radical prostatectomy however was found to have thrombocytopenia. Referred to [place] to evaluation for the low platelet count. Patient reports that he has been told that his platelet has been low in the past. He never received any treatment.  INTERVAL HISTORY Patient presents to discuss lab results. He is anxious to get treatment as he wants to improve his platelet level so that he can have prostate cancer procedure.  Reports no bleeding event or easy bruising.  Review of Systems  Constitutional: Negative for chills, fever, malaise/fatigue and weight loss.  HENT: Negative for hearing loss and tinnitus.   Eyes: Negative for blurred vision and double vision.  Respiratory: Negative for cough.   Cardiovascular: Negative for chest pain and palpitations.  Gastrointestinal: Negative for heartburn, nausea and vomiting.  Genitourinary: Negative for dysuria and urgency.  Musculoskeletal: Negative for myalgias.  Skin: Negative for rash.  Neurological: Negative for dizziness and headaches.  Endo/Heme/Allergies: Negative for environmental allergies. Does  not bruise/bleed easily.  Psychiatric/Behavioral: Negative for depression. The patient is nervous/anxious.     MEDICAL HISTORY:  Past Medical History:  Diagnosis Date  . Cancer [facility]) 2018   Prostate Cancer  . Diverticulosis   . GERD (gastroesophageal reflux disease)   . Hypercholesteremia     SURGICAL HISTORY: Past Surgical History:  Procedure Laterality Date  . COLONOSCOPY    . HERNIA REPAIR     10 years ago, [place]. umbilical hernia x 2    SOCIAL HISTORY: Social History   Socioeconomic History  . Marital status: Single    Spouse name: Not on file  . Number of children: Not on file  . Years of education: Not on file  . Highest education level: Not on file  Social Needs  . Financial resource strain: Not on file  . Food insecurity - worry: Not on file  . Food insecurity - inability: Not on file  . Transportation needs - medical: Not on file  . Transportation needs - non-medical: Not on file  Occupational History  . Not on file  Tobacco Use  . Smoking status: Former Smoker    Packs/day: 0.50    Years: 34.00    Pack years: 17.00    Types: Cigarettes    Last attempt to quit: 04/06/2017    Years since quitting: 0.0  . Smokeless tobacco: Never Used  Substance and Sexual Activity  . Alcohol use: Yes    Alcohol/week: 1.2 oz    Types: 2 Cans of beer per week  . Drug use: No  . Sexual activity: Yes  Other Topics Concern  . Not on file  Social History Narrative  . Not on file    FAMILY HISTORY: Family  History  Problem Relation Age of Onset  . Diabetes Mother   . Breast cancer Mother   . Breast cancer Sister     ALLERGIES:  is allergic to sulfa antibiotics.  MEDICATIONS:  Current Outpatient Medications  Medication Sig Dispense Refill  . latanoprost (XALATAN) 0.005 % ophthalmic solution Place 1 drop at bedtime into both eyes.   0  . pravastatin (PRAVACHOL) 40 MG tablet Take 40 mg by mouth daily.     No current facility-administered medications for this  visit.      PHYSICAL EXAMINATION: ECOG PERFORMANCE STATUS: 0 - Asymptomatic Vitals:   04/22/17 1528  BP: 108/68  Pulse: 80  Temp: 97.7 F (36.5 C)   Filed Weights   04/22/17 1528  Weight: 162 lb 6 oz (73.7 kg)    Physical Exam  Constitutional: He is oriented to person, place, and time and well-developed, well-nourished, and in no distress. No distress.  HENT:  Head: Normocephalic and atraumatic.  Mouth/Throat: No oropharyngeal exudate.  Eyes: Conjunctivae and EOM are normal. Pupils are equal, round, and reactive to light.  Neck: Normal range of motion. Neck supple. No JVD present.  Cardiovascular: Normal rate and regular rhythm.  No murmur heard. Pulmonary/Chest: Effort normal and breath sounds normal. No respiratory distress. He has no wheezes. He has no rales.  Abdominal: Soft. Bowel sounds are normal.  Musculoskeletal: Normal range of motion. He exhibits no edema.  Lymphadenopathy:    He has no cervical adenopathy.  Neurological: He is alert and oriented to person, place, and time.  Skin: Skin is warm and dry. He is not diaphoretic. No erythema.  Psychiatric: Affect and judgment normal.     LABORATORY DATA:  I have reviewed the data as listed Lab Results  Component Value Date   WBC 5.2 04/10/2017   HGB 14.9 04/10/2017   HCT 44.4 04/10/2017   MCV 90.7 04/10/2017   PLT 84 (L) 04/10/2017   Recent Labs    04/01/17 1518 04/10/17 1605  NA 138 142  K 3.7 3.7  CL 103 106  CO2 26 27  GLUCOSE 94 112*  BUN 9 13  CREATININE 0.84 0.69  CALCIUM 9.5 9.3  GFRNONAA >60 >60  GFRAA >60 >60  PROT  --  7.5  ALBUMIN  --  4.3  AST  --  23  ALT  --  18  ALKPHOS  --  115  BILITOT  --  0.5       ASSESSMENT & PLAN:  1. Thrombocytopenia ([place])   2. Prostate cancer ([place])   3. B12 deficiency   4. Alcohol use   I discussed patient about his lab result. Vitamin B12 deficiency can potentially lead to thrombocytopenia. I recommend intramuscular vitamin B12 1000 MCG daily  for 5 doses. Recheck B12 level and CBC response.  I also advised him to completely avoid alcohol use at this point as alcohol is pulmonary toxic.  If repeating B12 resulted no improvement of his thrombocytopenia, will proceed with bone marrow biopsy.  # Patient follow-up with me in approximately 1 week to  repeat test and discuss further management plan. All questions were answered. The patient knows to call the clinic with any problems questions or concerns.  Return of visit:  1 week. Thank you for this kind referral and the opportunity to participate in the care of this patient. A copy of today's note is routed to referring provider    Earlie Server, MD, PhD Hematology Oncology [facility] at [facility] Pager- [phone] 04/21/2017

## 2017-04-21 NOTE — Progress Notes (Signed)
Hematology/Oncology Consult note [facility] [phone] [phone]   Patient Care Team: Ronnell Freshwater, NP as PCP - General (Family Medicine) Christie Nottingham, PA as Referring Physician (Physician Assistant) Christene Lye, MD (General Surgery)  REFERRING PROVIDER: Dr.Brandon  CHIEF COMPLAINTS/PURPOSE OF CONSULTATION:  Evaluation of thrombocytopenia.  HISTORY OF PRESENTING ILLNESS:  Donald Mendoza is a  56 y.o.  male with PMH listed below who was referred to me for evaluation of thrombocytopenia. Patient was recently diagnosed with intermediate risk prostate cancer in the follows up with Dr. Erlene Quan. Initially he was diagnosed with a low-risk prostate cancer Gleason score 3+3 in March 2018 when his PSA was 4. PSA continued to rise to 5.2 and patient had a repeat prostate biopsy revealing Gleason score 4+3. Patient was scheduled for radical prostatectomy however was found to have thrombocytopenia. Referred to [place] to evaluation for the low platelet count. Patient reports that he has been told that his platelet has been low in the past. He never received any treatment.  He used to drink hard liquor on weekends but has quit for many years. He was former smoker, quit recently. Reports some weight loss, but otherwise feeling well. Denies any bleeding events.   Review of Systems  Constitutional: Positive for weight loss. Negative for chills and fever.  HENT: Negative for hearing loss.   Eyes: Negative for blurred vision.  Respiratory: Negative for cough.   Cardiovascular: Negative for chest pain.  Gastrointestinal: Negative for heartburn.  Genitourinary: Negative for dysuria.  Skin: Negative for rash.  Neurological: Negative for dizziness.  Endo/Heme/Allergies: Does not bruise/bleed easily.  Psychiatric/Behavioral: Negative for depression.    MEDICAL HISTORY:  Past Medical History:  Diagnosis Date  . B12 deficiency 04/21/2017  .  Cancer [facility]) 2018   Prostate Cancer  . Diverticulosis   . GERD (gastroesophageal reflux disease)   . Hypercholesteremia     SURGICAL HISTORY: Past Surgical History:  Procedure Laterality Date  . COLONOSCOPY    . HERNIA REPAIR     10 years ago, [place]. umbilical hernia x 2    SOCIAL HISTORY: Social History   Socioeconomic History  . Marital status: Single    Spouse name: Not on file  . Number of children: Not on file  . Years of education: Not on file  . Highest education level: Not on file  Social Needs  . Financial resource strain: Not on file  . Food insecurity - worry: Not on file  . Food insecurity - inability: Not on file  . Transportation needs - medical: Not on file  . Transportation needs - non-medical: Not on file  Occupational History  . Not on file  Tobacco Use  . Smoking status: Former Smoker    Packs/day: 0.50    Years: 34.00    Pack years: 17.00    Types: Cigarettes    Last attempt to quit: 04/06/2017    Years since quitting: 0.0  . Smokeless tobacco: Never Used  Substance and Sexual Activity  . Alcohol use: Yes    Alcohol/week: 1.2 oz    Types: 2 Cans of beer per week  . Drug use: No  . Sexual activity: Yes  Other Topics Concern  . Not on file  Social History Narrative  . Not on file    FAMILY HISTORY: Family History  Problem Relation Age of Onset  . Diabetes Mother   . Breast cancer Mother   . Breast cancer Sister  ALLERGIES:  is allergic to sulfa antibiotics.  MEDICATIONS:  Current Outpatient Medications  Medication Sig Dispense Refill  . latanoprost (XALATAN) 0.005 % ophthalmic solution Place 1 drop at bedtime into both eyes.   0  . pravastatin (PRAVACHOL) 40 MG tablet Take 40 mg by mouth daily.     No current facility-administered medications for this visit.      PHYSICAL EXAMINATION: ECOG PERFORMANCE STATUS: 0 - Asymptomatic There were no vitals filed for this visit. There were no vitals filed for this visit.  GENERAL:  No distress, well nourished.  SKIN:  No rashes or significant lesions  HEAD: Normocephalic, No masses, lesions, tenderness or abnormalities  EYES: Conjunctiva are pink, non icteric ENT: External ears normal ,lips , buccal mucosa, and tongue normal and mucous membranes are moist  LYMPH: No palpable cervical and axillary lymphadenopathy  LUNGS: Clear to auscultation, no crackles or wheezes HEART: Regular rate & rhythm, no murmurs, no gallops, S1 normal and S2 normal  ABDOMEN: Abdomen soft, non-tender, normal bowel sounds, I did not appreciate any  masses or organomegaly  MUSCULOSKELETAL: No CVA tenderness and no tenderness on percussion of the back or rib cage.  EXTREMITIES: No edema, no skin discoloration or tenderness. He has some chronic varicose veins on his left lower extremity. NEURO: Alert & oriented, no focal motor/sensory deficits.    LABORATORY DATA:  I have reviewed the data as listed Lab Results  Component Value Date   WBC 5.2 04/10/2017   HGB 14.9 04/10/2017   HCT 44.4 04/10/2017   MCV 90.7 04/10/2017   PLT 84 (L) 04/10/2017   Recent Labs    04/01/17 1518 04/10/17 1605  NA 138 142  K 3.7 3.7  CL 103 106  CO2 26 27  GLUCOSE 94 112*  BUN 9 13  CREATININE 0.84 0.69  CALCIUM 9.5 9.3  GFRNONAA >60 >60  GFRAA >60 >60  PROT  --  7.5  ALBUMIN  --  4.3  AST  --  23  ALT  --  18  ALKPHOS  --  115  BILITOT  --  0.5       ASSESSMENT & PLAN:  1. B12 deficiency    For the work up of patient's thrombocytopenia, I recommend checking CBC;CMP, LDH; pathology smear review, folate, Vitamin B12, hepatitis, HIV and monoclonal gammopathy workup. Will also check ultrasound of the abdomen.  Also, discussed with the patient that if no clear etiology found- bone marrow biopsy would be suggested. Currently await for the above workup.   # Patient follow-up with me in approximately 1 week to review the above results.  All questions were answered. The patient knows to call the  clinic with any problems questions or concerns.  Return of visit:  Thank you for this kind referral and the opportunity to participate in the care of this patient. A copy of today's note is routed to referring provider    Earlie Server, MD, PhD Hematology Oncology [facility] at [facility] Pager- [phone] 04/21/2017

## 2017-04-22 ENCOUNTER — Inpatient Hospital Stay: Payer: BLUE CROSS/BLUE SHIELD

## 2017-04-22 ENCOUNTER — Other Ambulatory Visit: Payer: Self-pay

## 2017-04-22 ENCOUNTER — Ambulatory Visit: Payer: BLUE CROSS/BLUE SHIELD | Admitting: Oncology

## 2017-04-22 ENCOUNTER — Inpatient Hospital Stay: Payer: BLUE CROSS/BLUE SHIELD | Attending: Oncology | Admitting: Oncology

## 2017-04-22 ENCOUNTER — Encounter: Payer: Self-pay | Admitting: Oncology

## 2017-04-22 VITALS — BP 108/68 | HR 80 | Temp 97.7°F | Wt 162.4 lb

## 2017-04-22 DIAGNOSIS — D696 Thrombocytopenia, unspecified: Secondary | ICD-10-CM | POA: Diagnosis not present

## 2017-04-22 DIAGNOSIS — K219 Gastro-esophageal reflux disease without esophagitis: Secondary | ICD-10-CM | POA: Diagnosis not present

## 2017-04-22 DIAGNOSIS — Z789 Other specified health status: Secondary | ICD-10-CM

## 2017-04-22 DIAGNOSIS — E538 Deficiency of other specified B group vitamins: Secondary | ICD-10-CM | POA: Diagnosis not present

## 2017-04-22 DIAGNOSIS — Z7289 Other problems related to lifestyle: Secondary | ICD-10-CM

## 2017-04-22 DIAGNOSIS — Z79899 Other long term (current) drug therapy: Secondary | ICD-10-CM

## 2017-04-22 DIAGNOSIS — C61 Malignant neoplasm of prostate: Secondary | ICD-10-CM | POA: Diagnosis not present

## 2017-04-22 DIAGNOSIS — Z8719 Personal history of other diseases of the digestive system: Secondary | ICD-10-CM | POA: Diagnosis not present

## 2017-04-22 DIAGNOSIS — E7800 Pure hypercholesterolemia, unspecified: Secondary | ICD-10-CM

## 2017-04-22 DIAGNOSIS — Z803 Family history of malignant neoplasm of breast: Secondary | ICD-10-CM | POA: Diagnosis not present

## 2017-04-22 MED ORDER — CYANOCOBALAMIN 1000 MCG/ML IJ SOLN
1000.0000 ug | Freq: Once | INTRAMUSCULAR | Status: AC
  Administered 2017-04-22: 1000 ug via INTRAMUSCULAR

## 2017-04-22 NOTE — Progress Notes (Signed)
Patient here today for follow up.  Patient c/o fatigue 

## 2017-04-23 ENCOUNTER — Ambulatory Visit: Payer: BLUE CROSS/BLUE SHIELD

## 2017-04-23 ENCOUNTER — Inpatient Hospital Stay: Payer: BLUE CROSS/BLUE SHIELD

## 2017-04-23 DIAGNOSIS — Z79899 Other long term (current) drug therapy: Secondary | ICD-10-CM | POA: Diagnosis not present

## 2017-04-23 DIAGNOSIS — E538 Deficiency of other specified B group vitamins: Secondary | ICD-10-CM | POA: Diagnosis not present

## 2017-04-23 DIAGNOSIS — Z8719 Personal history of other diseases of the digestive system: Secondary | ICD-10-CM | POA: Diagnosis not present

## 2017-04-23 DIAGNOSIS — Z803 Family history of malignant neoplasm of breast: Secondary | ICD-10-CM | POA: Diagnosis not present

## 2017-04-23 DIAGNOSIS — D696 Thrombocytopenia, unspecified: Secondary | ICD-10-CM | POA: Diagnosis not present

## 2017-04-23 DIAGNOSIS — E7800 Pure hypercholesterolemia, unspecified: Secondary | ICD-10-CM | POA: Diagnosis not present

## 2017-04-23 DIAGNOSIS — K219 Gastro-esophageal reflux disease without esophagitis: Secondary | ICD-10-CM | POA: Diagnosis not present

## 2017-04-23 DIAGNOSIS — C61 Malignant neoplasm of prostate: Secondary | ICD-10-CM | POA: Diagnosis not present

## 2017-04-23 MED ORDER — CYANOCOBALAMIN 1000 MCG/ML IJ SOLN
1000.0000 ug | Freq: Once | INTRAMUSCULAR | Status: AC
  Administered 2017-04-23: 1000 ug via INTRAMUSCULAR

## 2017-04-24 ENCOUNTER — Inpatient Hospital Stay: Payer: BLUE CROSS/BLUE SHIELD

## 2017-04-24 DIAGNOSIS — Z803 Family history of malignant neoplasm of breast: Secondary | ICD-10-CM | POA: Diagnosis not present

## 2017-04-24 DIAGNOSIS — Z79899 Other long term (current) drug therapy: Secondary | ICD-10-CM | POA: Diagnosis not present

## 2017-04-24 DIAGNOSIS — K219 Gastro-esophageal reflux disease without esophagitis: Secondary | ICD-10-CM | POA: Diagnosis not present

## 2017-04-24 DIAGNOSIS — D696 Thrombocytopenia, unspecified: Secondary | ICD-10-CM | POA: Diagnosis not present

## 2017-04-24 DIAGNOSIS — E7800 Pure hypercholesterolemia, unspecified: Secondary | ICD-10-CM | POA: Diagnosis not present

## 2017-04-24 DIAGNOSIS — E538 Deficiency of other specified B group vitamins: Secondary | ICD-10-CM

## 2017-04-24 DIAGNOSIS — C61 Malignant neoplasm of prostate: Secondary | ICD-10-CM | POA: Diagnosis not present

## 2017-04-24 DIAGNOSIS — Z8719 Personal history of other diseases of the digestive system: Secondary | ICD-10-CM | POA: Diagnosis not present

## 2017-04-24 MED ORDER — CYANOCOBALAMIN 1000 MCG/ML IJ SOLN
1000.0000 ug | Freq: Once | INTRAMUSCULAR | Status: AC
  Administered 2017-04-24: 1000 ug via INTRAMUSCULAR

## 2017-04-25 ENCOUNTER — Inpatient Hospital Stay: Payer: BLUE CROSS/BLUE SHIELD

## 2017-04-25 DIAGNOSIS — E538 Deficiency of other specified B group vitamins: Secondary | ICD-10-CM

## 2017-04-25 DIAGNOSIS — Z79899 Other long term (current) drug therapy: Secondary | ICD-10-CM | POA: Diagnosis not present

## 2017-04-25 DIAGNOSIS — K219 Gastro-esophageal reflux disease without esophagitis: Secondary | ICD-10-CM | POA: Diagnosis not present

## 2017-04-25 DIAGNOSIS — E7800 Pure hypercholesterolemia, unspecified: Secondary | ICD-10-CM | POA: Diagnosis not present

## 2017-04-25 DIAGNOSIS — Z803 Family history of malignant neoplasm of breast: Secondary | ICD-10-CM | POA: Diagnosis not present

## 2017-04-25 DIAGNOSIS — C61 Malignant neoplasm of prostate: Secondary | ICD-10-CM | POA: Diagnosis not present

## 2017-04-25 DIAGNOSIS — Z8719 Personal history of other diseases of the digestive system: Secondary | ICD-10-CM | POA: Diagnosis not present

## 2017-04-25 DIAGNOSIS — D696 Thrombocytopenia, unspecified: Secondary | ICD-10-CM | POA: Diagnosis not present

## 2017-04-25 MED ORDER — CYANOCOBALAMIN 1000 MCG/ML IJ SOLN
1000.0000 ug | Freq: Once | INTRAMUSCULAR | Status: AC
  Administered 2017-04-25: 1000 ug via INTRAMUSCULAR
  Filled 2017-04-25: qty 1

## 2017-04-26 ENCOUNTER — Other Ambulatory Visit: Payer: Self-pay | Admitting: *Deleted

## 2017-04-26 ENCOUNTER — Inpatient Hospital Stay: Payer: BLUE CROSS/BLUE SHIELD

## 2017-04-26 DIAGNOSIS — Z8719 Personal history of other diseases of the digestive system: Secondary | ICD-10-CM | POA: Diagnosis not present

## 2017-04-26 DIAGNOSIS — E538 Deficiency of other specified B group vitamins: Secondary | ICD-10-CM

## 2017-04-26 DIAGNOSIS — C61 Malignant neoplasm of prostate: Secondary | ICD-10-CM | POA: Diagnosis not present

## 2017-04-26 DIAGNOSIS — Z803 Family history of malignant neoplasm of breast: Secondary | ICD-10-CM | POA: Diagnosis not present

## 2017-04-26 DIAGNOSIS — E7800 Pure hypercholesterolemia, unspecified: Secondary | ICD-10-CM | POA: Diagnosis not present

## 2017-04-26 DIAGNOSIS — K219 Gastro-esophageal reflux disease without esophagitis: Secondary | ICD-10-CM | POA: Diagnosis not present

## 2017-04-26 DIAGNOSIS — Z79899 Other long term (current) drug therapy: Secondary | ICD-10-CM | POA: Diagnosis not present

## 2017-04-26 DIAGNOSIS — D696 Thrombocytopenia, unspecified: Secondary | ICD-10-CM | POA: Diagnosis not present

## 2017-04-26 LAB — CBC WITH DIFFERENTIAL/PLATELET
BASOS ABS: 0 10*3/uL (ref 0–0.1)
BASOS PCT: 1 %
Eosinophils Absolute: 0.1 10*3/uL (ref 0–0.7)
Eosinophils Relative: 1 %
HEMATOCRIT: 43.5 % (ref 40.0–52.0)
HEMOGLOBIN: 14.3 g/dL (ref 13.0–18.0)
LYMPHS PCT: 29 %
Lymphs Abs: 1.9 10*3/uL (ref 1.0–3.6)
MCH: 29.8 pg (ref 26.0–34.0)
MCHC: 32.9 g/dL (ref 32.0–36.0)
MCV: 90.5 fL (ref 80.0–100.0)
MONO ABS: 0.4 10*3/uL (ref 0.2–1.0)
Monocytes Relative: 7 %
NEUTROS ABS: 4 10*3/uL (ref 1.4–6.5)
NEUTROS PCT: 62 %
Platelets: 70 10*3/uL — ABNORMAL LOW (ref 150–400)
RBC: 4.81 MIL/uL (ref 4.40–5.90)
RDW: 14.9 % — AB (ref 11.5–14.5)
WBC: 6.4 10*3/uL (ref 3.8–10.6)

## 2017-04-26 LAB — VITAMIN B12: Vitamin B-12: 1268 pg/mL — ABNORMAL HIGH (ref 180–914)

## 2017-04-26 MED ORDER — CYANOCOBALAMIN 1000 MCG/ML IJ SOLN
1000.0000 ug | Freq: Once | INTRAMUSCULAR | Status: AC
  Administered 2017-04-26: 1000 ug via INTRAMUSCULAR

## 2017-04-29 ENCOUNTER — Inpatient Hospital Stay: Payer: BLUE CROSS/BLUE SHIELD | Admitting: Oncology

## 2017-04-30 ENCOUNTER — Inpatient Hospital Stay (HOSPITAL_BASED_OUTPATIENT_CLINIC_OR_DEPARTMENT_OTHER): Payer: BLUE CROSS/BLUE SHIELD | Admitting: Oncology

## 2017-04-30 ENCOUNTER — Encounter: Payer: Self-pay | Admitting: Oncology

## 2017-04-30 ENCOUNTER — Inpatient Hospital Stay: Payer: BLUE CROSS/BLUE SHIELD

## 2017-04-30 ENCOUNTER — Other Ambulatory Visit: Payer: Self-pay

## 2017-04-30 ENCOUNTER — Other Ambulatory Visit: Payer: Self-pay | Admitting: Oncology

## 2017-04-30 VITALS — BP 107/73 | HR 73 | Temp 95.8°F | Resp 18 | Wt 159.7 lb

## 2017-04-30 DIAGNOSIS — E7800 Pure hypercholesterolemia, unspecified: Secondary | ICD-10-CM

## 2017-04-30 DIAGNOSIS — K219 Gastro-esophageal reflux disease without esophagitis: Secondary | ICD-10-CM

## 2017-04-30 DIAGNOSIS — D696 Thrombocytopenia, unspecified: Secondary | ICD-10-CM

## 2017-04-30 DIAGNOSIS — Z803 Family history of malignant neoplasm of breast: Secondary | ICD-10-CM

## 2017-04-30 DIAGNOSIS — C61 Malignant neoplasm of prostate: Secondary | ICD-10-CM

## 2017-04-30 DIAGNOSIS — E538 Deficiency of other specified B group vitamins: Secondary | ICD-10-CM | POA: Diagnosis not present

## 2017-04-30 DIAGNOSIS — Z8719 Personal history of other diseases of the digestive system: Secondary | ICD-10-CM

## 2017-04-30 DIAGNOSIS — Z79899 Other long term (current) drug therapy: Secondary | ICD-10-CM

## 2017-04-30 LAB — CBC WITH DIFFERENTIAL/PLATELET
BASOS ABS: 0 10*3/uL (ref 0–0.1)
Basophils Relative: 1 %
Eosinophils Absolute: 0.1 10*3/uL (ref 0–0.7)
Eosinophils Relative: 2 %
HEMATOCRIT: 44 % (ref 40.0–52.0)
HEMOGLOBIN: 14.5 g/dL (ref 13.0–18.0)
LYMPHS PCT: 32 %
Lymphs Abs: 2 10*3/uL (ref 1.0–3.6)
MCH: 30.1 pg (ref 26.0–34.0)
MCHC: 33.1 g/dL (ref 32.0–36.0)
MCV: 91.1 fL (ref 80.0–100.0)
Monocytes Absolute: 0.4 10*3/uL (ref 0.2–1.0)
Monocytes Relative: 7 %
NEUTROS ABS: 3.5 10*3/uL (ref 1.4–6.5)
NEUTROS PCT: 58 %
Platelets: 77 10*3/uL — ABNORMAL LOW (ref 150–400)
RBC: 4.83 MIL/uL (ref 4.40–5.90)
RDW: 14.7 % — ABNORMAL HIGH (ref 11.5–14.5)
WBC: 6.1 10*3/uL (ref 3.8–10.6)

## 2017-04-30 LAB — PATHOLOGIST SMEAR REVIEW

## 2017-04-30 MED ORDER — DEXAMETHASONE 4 MG PO TABS: 40.0000 mg | ORAL_TABLET | Freq: Every day | ORAL | 0 refills | Status: DC

## 2017-04-30 NOTE — Progress Notes (Signed)
Hematology/Oncology  Follow up note [facility] [phone] [phone]   Patient Care Team: Ronnell Freshwater, NP as PCP - General (Family Medicine) Christie Nottingham, PA as Referring Physician (Physician Assistant) Christene Lye, MD (General Surgery)  PURPOSE OF CONSULTATION:  Follow up of management of thrombocytopenia.  HISTORY OF PRESENTING ILLNESS:  Donald Mendoza is a  56 y.o.  male with PMH listed below who was referred to me for evaluation of thrombocytopenia. Patient was recently diagnosed with intermediate risk prostate cancer in the follows up with Dr. Erlene Quan. Initially he was diagnosed with a low-risk prostate cancer Gleason score 3+3 in March 2018 when his PSA was 4. PSA continued to rise to 5.2 and patient had a repeat prostate biopsy revealing Gleason score 4+3. Patient was scheduled for radical prostatectomy however was found to have thrombocytopenia. Referred to [place] to evaluation for the low platelet count. Patient reports that he has been told that his platelet has been low in the past. He never received any treatment.  INTERVAL HISTORY Patient presents to discuss lab results. During the interval, he has received IM vitamin B12 supplemetation. Subjectively he feels energy level has significantly improved. No bleeding events or easy bruising. He reports not having any alcohol since last visit.   Review of Systems  Constitutional: Negative for diaphoresis, malaise/fatigue and weight loss.  HENT: Negative for ear pain, hearing loss and tinnitus.   Eyes: Negative for blurred vision, double vision and photophobia.  Respiratory: Negative for cough and hemoptysis.   Cardiovascular: Negative for chest pain and palpitations.  Gastrointestinal: Negative for abdominal pain, nausea and vomiting.  Genitourinary: Negative for frequency and urgency.  Musculoskeletal: Negative for myalgias and neck pain.  Skin: Negative for itching and  rash.  Neurological: Negative for dizziness, tingling and headaches.  Endo/Heme/Allergies: Negative for environmental allergies. Does not bruise/bleed easily.  Psychiatric/Behavioral: Negative for depression and suicidal ideas. The patient is not nervous/anxious.     MEDICAL HISTORY:  Past Medical History:  Diagnosis Date  . B12 deficiency 04/21/2017  . Cancer [facility]) 2018   Prostate Cancer  . Diverticulosis   . GERD (gastroesophageal reflux disease)   . Hypercholesteremia     SURGICAL HISTORY: Past Surgical History:  Procedure Laterality Date  . COLONOSCOPY    . HERNIA REPAIR     10 years ago, [place]. umbilical hernia x 2    SOCIAL HISTORY: Social History   Socioeconomic History  . Marital status: Single    Spouse name: Not on file  . Number of children: Not on file  . Years of education: Not on file  . Highest education level: Not on file  Social Needs  . Financial resource strain: Not on file  . Food insecurity - worry: Not on file  . Food insecurity - inability: Not on file  . Transportation needs - medical: Not on file  . Transportation needs - non-medical: Not on file  Occupational History  . Not on file  Tobacco Use  . Smoking status: Former Smoker    Packs/day: 0.50    Years: 34.00    Pack years: 17.00    Types: Cigarettes    Last attempt to quit: 04/06/2017    Years since quitting: 0.0  . Smokeless tobacco: Never Used  Substance and Sexual Activity  . Alcohol use: Yes    Alcohol/week: 1.2 oz    Types: 2 Cans of beer per week  . Drug use: No  . Sexual activity: Yes  Other Topics Concern  . Not on file  Social History Narrative  . Not on file    FAMILY HISTORY: Family History  Problem Relation Age of Onset  . Diabetes Mother   . Breast cancer Mother   . Breast cancer Sister     ALLERGIES:  is allergic to sulfa antibiotics.  MEDICATIONS:  Current Outpatient Medications  Medication Sig Dispense Refill  . latanoprost (XALATAN) 0.005 %  ophthalmic solution Place 1 drop at bedtime into both eyes.   0  . pravastatin (PRAVACHOL) 40 MG tablet Take 40 mg by mouth daily.     No current facility-administered medications for this visit.      PHYSICAL EXAMINATION: ECOG PERFORMANCE STATUS: 0 - Asymptomatic Vitals:   04/30/17 1413  BP: 107/73  Pulse: 73  Resp: 18  Temp: (!) 95.8 F (35.4 C)   Filed Weights   04/30/17 1413  Weight: 159 lb 11.2 oz (72.4 kg)    Physical Exam  Constitutional: He is oriented to person, place, and time and well-developed, well-nourished, and in no distress. No distress.  HENT:  Head: Normocephalic and atraumatic.  Mouth/Throat: No oropharyngeal exudate.  Eyes: Conjunctivae and EOM are normal. Pupils are equal, round, and reactive to light. No scleral icterus.  Neck: Normal range of motion. Neck supple. No JVD present. No thyromegaly present.  Cardiovascular: Normal rate, regular rhythm and normal heart sounds.  No murmur heard. Pulmonary/Chest: Effort normal and breath sounds normal. No respiratory distress. He has no wheezes. He exhibits no tenderness.  Abdominal: Soft. Bowel sounds are normal. He exhibits no distension.  Musculoskeletal: Normal range of motion. He exhibits no edema.  Lymphadenopathy:    He has no cervical adenopathy.  Neurological: He is alert and oriented to person, place, and time. Gait normal.  Skin: Skin is warm and dry. He is not diaphoretic. No erythema.  Psychiatric: Affect and judgment normal.     LABORATORY DATA:  I have reviewed the data as listed Lab Results  Component Value Date   WBC 6.4 04/26/2017   HGB 14.3 04/26/2017   HCT 43.5 04/26/2017   MCV 90.5 04/26/2017   PLT 70 (L) 04/26/2017   Recent Labs    04/01/17 1518 04/10/17 1605  NA 138 142  K 3.7 3.7  CL 103 106  CO2 26 27  GLUCOSE 94 112*  BUN 9 13  CREATININE 0.84 0.69  CALCIUM 9.5 9.3  GFRNONAA >60 >60  GFRAA >60 >60  PROT  --  7.5  ALBUMIN  --  4.3  AST  --  23  ALT  --  18    ALKPHOS  --  115  BILITOT  --  0.5    RADIOGRAPHIC STUDIES: I have personally reviewed the radiological images as listed and agreed with the findings in the report. US Abdomen showed no liver parachyema changes or splenomegaly.    ASSESSMENT & PLAN:  1. Thrombocytopenia ([place])   2. B12 deficiency     # B12 deficiency, s/p IM vitamin B12, vitamin b12 improved.  # Thrombocytopenia: differential diagnosis includes ITP vs primary bone marrow disorder. Plan trial steroids. Will give him Dexamethasone 40 mg daily x 4.  Check peripheral blood smear.  If platelet does not improve, he may have underlying bone marrow disease which requires more comprehensive work up including bone marrow biopsy. He can then have platelet transfusion prior to and during radiacal prostatectomy so that his prostate cancer treatment does not get delayed by work up.   #  Patient follow-up with me in approximately 1 week to  repeat test  All questions were answered. The patient knows to call the clinic with any problems questions or concerns.  Return of visit:  1 week with CBC Thank you for this kind referral and the opportunity to participate in the care of this patient. A copy of today's note is routed to referring provider    Earlie Server, MD, PhD Hematology Oncology [facility] at [facility] Pager- [phone] 04/30/2017

## 2017-04-30 NOTE — Progress Notes (Signed)
Here for follow up. Stated that he feels better and energy is better.

## 2017-05-06 ENCOUNTER — Other Ambulatory Visit: Payer: Self-pay

## 2017-05-06 ENCOUNTER — Inpatient Hospital Stay: Payer: BLUE CROSS/BLUE SHIELD

## 2017-05-06 ENCOUNTER — Encounter: Payer: Self-pay | Admitting: Oncology

## 2017-05-06 ENCOUNTER — Inpatient Hospital Stay (HOSPITAL_BASED_OUTPATIENT_CLINIC_OR_DEPARTMENT_OTHER): Payer: BLUE CROSS/BLUE SHIELD | Admitting: Oncology

## 2017-05-06 VITALS — BP 107/65 | HR 88 | Temp 96.7°F | Wt 165.4 lb

## 2017-05-06 DIAGNOSIS — C61 Malignant neoplasm of prostate: Secondary | ICD-10-CM

## 2017-05-06 DIAGNOSIS — Z8719 Personal history of other diseases of the digestive system: Secondary | ICD-10-CM

## 2017-05-06 DIAGNOSIS — Z7289 Other problems related to lifestyle: Secondary | ICD-10-CM

## 2017-05-06 DIAGNOSIS — Z79899 Other long term (current) drug therapy: Secondary | ICD-10-CM | POA: Diagnosis not present

## 2017-05-06 DIAGNOSIS — Z803 Family history of malignant neoplasm of breast: Secondary | ICD-10-CM | POA: Diagnosis not present

## 2017-05-06 DIAGNOSIS — E538 Deficiency of other specified B group vitamins: Secondary | ICD-10-CM

## 2017-05-06 DIAGNOSIS — D696 Thrombocytopenia, unspecified: Secondary | ICD-10-CM

## 2017-05-06 DIAGNOSIS — K219 Gastro-esophageal reflux disease without esophagitis: Secondary | ICD-10-CM

## 2017-05-06 DIAGNOSIS — E7800 Pure hypercholesterolemia, unspecified: Secondary | ICD-10-CM | POA: Diagnosis not present

## 2017-05-06 DIAGNOSIS — Z789 Other specified health status: Secondary | ICD-10-CM

## 2017-05-06 LAB — CBC WITH DIFFERENTIAL/PLATELET
BASOS ABS: 0.1 10*3/uL (ref 0–0.1)
Basophils Relative: 1 %
EOS PCT: 2 %
Eosinophils Absolute: 0.1 10*3/uL (ref 0–0.7)
HCT: 46 % (ref 40.0–52.0)
Hemoglobin: 15.3 g/dL (ref 13.0–18.0)
LYMPHS PCT: 25 %
Lymphs Abs: 2.2 10*3/uL (ref 1.0–3.6)
MCH: 30.1 pg (ref 26.0–34.0)
MCHC: 33.2 g/dL (ref 32.0–36.0)
MCV: 90.6 fL (ref 80.0–100.0)
MONO ABS: 0.5 10*3/uL (ref 0.2–1.0)
Monocytes Relative: 6 %
NEUTROS ABS: 5.8 10*3/uL (ref 1.4–6.5)
Neutrophils Relative %: 66 %
PLATELETS: 88 10*3/uL — AB (ref 150–400)
RBC: 5.08 MIL/uL (ref 4.40–5.90)
RDW: 14.4 % (ref 11.5–14.5)
WBC: 8.6 10*3/uL (ref 3.8–10.6)

## 2017-05-06 MED ORDER — PRAVASTATIN SODIUM 40 MG PO TABS: 40.0000 mg | ORAL_TABLET | Freq: Every day | ORAL | 6 refills | Status: DC

## 2017-05-06 NOTE — Progress Notes (Signed)
Hematology/Oncology  Follow up note [facility] Telephone:([phone] Fax:(336) [phone]   Patient Care Team: Ronnell Freshwater, NP as PCP - General (Family Medicine) Christie Nottingham, PA as Referring Physician (Physician Assistant) Christene Lye, MD (General Surgery)  PURPOSE OF CONSULTATION:  Follow up of management of thrombocytopenia.  HISTORY OF PRESENTING ILLNESS:  Donald Mendoza is a  56 y.o.  male with PMH listed below who was referred to me for evaluation of thrombocytopenia. Patient was recently diagnosed with intermediate risk prostate cancer in the follows up with Dr. Erlene Quan. Initially he was diagnosed with a low-risk prostate cancer Gleason score 3+3 in March 2018 when his PSA was 4. PSA continued to rise to 5.2 and patient had a repeat prostate biopsy revealing Gleason score 4+3. Patient was scheduled for radical prostatectomy however was found to have thrombocytopenia. Referred to [place] to evaluation for the low platelet count. Patient reports that he has been told that his platelet has been low in the past. He never received any treatment.  INTERVAL HISTORY Patient presents to discuss lab results.s/p  IM vitamin B12 supplemetation. During the interval, he also was treated 4 days course of high dose dexamethasone 40mg  daily. He has stopped consuming ETOH as well. Denies any bleeding, abdominal pain. Reports insomnia while on steroids.   Review of Systems  Constitutional: Negative for diaphoresis, malaise/fatigue and weight loss.  HENT: Negative for ear pain, hearing loss and tinnitus.   Eyes: Negative for blurred vision, double vision and photophobia.  Respiratory: Negative for cough and hemoptysis.   Cardiovascular: Negative for chest pain and palpitations.  Gastrointestinal: Negative for abdominal pain, nausea and vomiting.  Genitourinary: Negative for frequency and urgency.  Musculoskeletal: Negative for myalgias and neck pain.  Skin:  Negative for itching and rash.  Neurological: Negative for dizziness, tingling and headaches.  Endo/Heme/Allergies: Negative for environmental allergies. Does not bruise/bleed easily.  Psychiatric/Behavioral: Negative for depression and suicidal ideas. The patient is not nervous/anxious.     MEDICAL HISTORY:  Past Medical History:  Diagnosis Date  . B12 deficiency 04/21/2017  . Cancer [facility]) 2018   Prostate Cancer  . Diverticulosis   . GERD (gastroesophageal reflux disease)   . Hypercholesteremia     SURGICAL HISTORY: Past Surgical History:  Procedure Laterality Date  . COLONOSCOPY    . HERNIA REPAIR     10 years ago, [place]. umbilical hernia x 2    SOCIAL HISTORY: Social History   Socioeconomic History  . Marital status: Single    Spouse name: Not on file  . Number of children: Not on file  . Years of education: Not on file  . Highest education level: Not on file  Social Needs  . Financial resource strain: Not on file  . Food insecurity - worry: Not on file  . Food insecurity - inability: Not on file  . Transportation needs - medical: Not on file  . Transportation needs - non-medical: Not on file  Occupational History  . Not on file  Tobacco Use  . Smoking status: Former Smoker    Packs/day: 0.50    Years: 34.00    Pack years: 17.00    Types: Cigarettes    Last attempt to quit: 04/06/2017    Years since quitting: 0.0  . Smokeless tobacco: Never Used  Substance and Sexual Activity  . Alcohol use: Yes    Alcohol/week: 1.2 oz    Types: 2 Cans of beer per week  . Drug use: No  .  Sexual activity: Yes  Other Topics Concern  . Not on file  Social History Narrative  . Not on file    FAMILY HISTORY: Family History  Problem Relation Age of Onset  . Diabetes Mother   . Breast cancer Mother   . Breast cancer Sister     ALLERGIES:  is allergic to sulfa antibiotics.  MEDICATIONS:  Current Outpatient Medications  Medication Sig Dispense Refill  . latanoprost  (XALATAN) 0.005 % ophthalmic solution Place 1 drop at bedtime into both eyes.   0  . pravastatin (PRAVACHOL) 40 MG tablet Take 1 tablet (40 mg total) by mouth daily. 30 tablet 6   No current facility-administered medications for this visit.      PHYSICAL EXAMINATION: ECOG PERFORMANCE STATUS: 0 - Asymptomatic Vitals:   05/06/17 1408  BP: 107/65  Pulse: 88  Temp: (!) 96.7 F (35.9 C)   Filed Weights   05/06/17 1408  Weight: 165 lb 7 oz (75 kg)    Physical Exam  Constitutional: He is oriented to person, place, and time and well-developed, well-nourished, and in no distress. No distress.  HENT:  Head: Normocephalic and atraumatic.  Mouth/Throat: No oropharyngeal exudate.  Eyes: Conjunctivae and EOM are normal. Pupils are equal, round, and reactive to light. No scleral icterus.  Neck: Normal range of motion. Neck supple. No JVD present. No thyromegaly present.  Cardiovascular: Normal rate, regular rhythm and normal heart sounds.  No murmur heard. Pulmonary/Chest: Effort normal and breath sounds normal. No respiratory distress. He has no wheezes. He exhibits no tenderness.  Abdominal: Soft. Bowel sounds are normal. He exhibits no distension.  Musculoskeletal: Normal range of motion. He exhibits no edema.  Lymphadenopathy:    He has no cervical adenopathy.  Neurological: He is alert and oriented to person, place, and time. Gait normal.  Skin: Skin is warm and dry. He is not diaphoretic. No erythema.  Psychiatric: Affect and judgment normal.     LABORATORY DATA:  I have reviewed the data as listed Lab Results  Component Value Date   WBC 8.6 05/06/2017   HGB 15.3 05/06/2017   HCT 46.0 05/06/2017   MCV 90.6 05/06/2017   PLT 88 (L) 05/06/2017   Recent Labs    04/01/17 1518 04/10/17 1605  NA 138 142  K 3.7 3.7  CL 103 106  CO2 26 27  GLUCOSE 94 112*  BUN 9 13  CREATININE 0.84 0.69  CALCIUM 9.5 9.3  GFRNONAA >60 >60  GFRAA >60 >60  PROT  --  7.5  ALBUMIN  --  4.3    AST  --  23  ALT  --  18  ALKPHOS  --  115  BILITOT  --  0.5    RADIOGRAPHIC STUDIES: I have personally reviewed the radiological images as listed and agreed with the findings in the report. US Abdomen showed no liver parachyema changes or splenomegaly.    ASSESSMENT & PLAN:  1. Thrombocytopenia ([place])   2. B12 deficiency   3. Prostate cancer ([place])   4. Alcohol use     # B12 deficiency, s/p IM vitamin B12, vitamin b12 improved and his fatigue has much improved.  # Thrombocytopenia: He took a course of high dose Dexamethasone and platelet only improved mildly. Less likely autoimmune process.  At this point, will hold additional work up so that his radical prostatectomy will not be further delayed.  Recommend repeat cbc prior to procedure, and transfuse platelet to 100,000.  Patient can follow up with  me in 4 weeks to have further thrombocytopenia work up done.  All questions were answered. The patient knows to call the clinic with any problems questions or concerns.  Return of visit:  4 weeks and will order additional work up at that point.  Thank you for this kind referral and the opportunity to participate in the care of this patient. A copy of today's note is routed to referring provider Dr.Brandon Ashely.   Earlie Server, MD, PhD Hematology Oncology [facility] at [facility] Pager- [phone] 05/06/2017

## 2017-05-06 NOTE — Progress Notes (Signed)
Patient here today for follow up.  Patient c/o fatigue 

## 2017-05-07 ENCOUNTER — Encounter: Payer: Self-pay | Admitting: Urology

## 2017-05-07 ENCOUNTER — Ambulatory Visit (INDEPENDENT_AMBULATORY_CARE_PROVIDER_SITE_OTHER): Payer: BLUE CROSS/BLUE SHIELD | Admitting: Urology

## 2017-05-07 ENCOUNTER — Other Ambulatory Visit: Payer: Self-pay | Admitting: Oncology

## 2017-05-07 ENCOUNTER — Other Ambulatory Visit: Payer: Self-pay | Admitting: Radiology

## 2017-05-07 ENCOUNTER — Encounter: Payer: Self-pay | Admitting: Radiology

## 2017-05-07 VITALS — BP 108/72 | HR 76 | Ht 67.0 in | Wt 163.6 lb

## 2017-05-07 DIAGNOSIS — C61 Malignant neoplasm of prostate: Secondary | ICD-10-CM

## 2017-05-07 DIAGNOSIS — D696 Thrombocytopenia, unspecified: Secondary | ICD-10-CM | POA: Diagnosis not present

## 2017-05-07 NOTE — Progress Notes (Signed)
05/07/2017 1:02 PM   Donald Mendoza February 04, 1961 [id number]  Referring provider: [facility] [place] [address] [place], [place] [address]  Chief Complaint  Patient presents with  . Prostate Cancer    HPI: 56 year old male with intermediate risk prostate cancer previously scheduled to undergo robotic radical prostatectomy.  On the day of the procedure, his platelets were noted to be in the 60s.  His surgery was canceled and since that time, he had extensive hematologic workup with Dr. Tasia Catchings.  He was treated with prednisone without significant increase of his platelets, most recent 79.  At this point in time, he was cleared for surgery with plans for platelet transfusion prior to the procedure.  In terms of prostate cancer, he was initally diagnosed with low risk prostate cancer, Gleason 3+3 in 1 of 12 cores in 07/2016 at which time his PSA was 4.  His PSA continues to rise up to 5.2 in October 2018.  He underwent repeat prostate biopsy revealing Gleason 4+3 in 3 of 12 cores, all on the right side involving up to 12% of the tissue.  Rectal exam unremarkable.  TRUS vol 30g.  He has a personal hx of umbilical and inguinal hernia repairs.  He is unsure if mesh was used  See previous notes for details.  He continues to strongly be interested in surgery rather than radiation.   PMH: Past Medical History:  Diagnosis Date  . B12 deficiency 04/21/2017  . Cancer [facility]) 2018   Prostate Cancer  . Diverticulosis   . GERD (gastroesophageal reflux disease)   . Hypercholesteremia     Surgical History: Past Surgical History:  Procedure Laterality Date  . COLONOSCOPY    . HERNIA REPAIR     10 years ago, [place]. umbilical hernia x 2    Home Medications:  Allergies as of 05/07/2017      Reactions   Sulfa Antibiotics Hives      Medication List        Accurate as of 05/07/17  1:02 PM. Always use your most recent med list.          latanoprost 0.005 % ophthalmic solution Commonly  known as:  XALATAN Place 1 drop at bedtime into both eyes.   pravastatin 40 MG tablet Commonly known as:  PRAVACHOL Take 1 tablet (40 mg total) by mouth daily.       Allergies:  Allergies  Allergen Reactions  . Sulfa Antibiotics Hives    Family History: Family History  Problem Relation Age of Onset  . Diabetes Mother   . Breast cancer Mother   . Breast cancer Sister     Social History:  reports that he quit smoking about 4 weeks ago. His smoking use included cigarettes. He has a 17.00 pack-year smoking history. he has never used smokeless tobacco. He reports that he drinks about 1.2 oz of alcohol per week. He reports that he does not use drugs.  ROS: UROLOGY Frequent Urination?: No Hard to postpone urination?: No Burning/pain with urination?: No Get up at night to urinate?: No Leakage of urine?: No Urine stream starts and stops?: No Trouble starting stream?: No Do you have to strain to urinate?: No Blood in urine?: No Urinary tract infection?: No Sexually transmitted disease?: No Injury to kidneys or bladder?: No Painful intercourse?: No Weak stream?: No Erection problems?: No Penile pain?: No  Gastrointestinal Nausea?: No Vomiting?: No Indigestion/heartburn?: Yes Diarrhea?: Yes Constipation?: No  Constitutional Fever: No Night sweats?: No Weight loss?: No  Fatigue?: No  Skin Skin rash/lesions?: No Itching?: No  Eyes Blurred vision?: No Double vision?: No  Ears/Nose/Throat Sore throat?: No Sinus problems?: No  Hematologic/Lymphatic Swollen glands?: No Easy bruising?: No  Cardiovascular Leg swelling?: No Chest pain?: No  Respiratory Cough?: No Shortness of breath?: No  Endocrine Excessive thirst?: No  Musculoskeletal Back pain?: No Joint pain?: No  Neurological Headaches?: No Dizziness?: No  Psychologic Depression?: No Anxiety?: No  Physical Exam: BP 108/72 (BP Location: Right Arm, Patient Position: Sitting, Cuff Size:  Normal)   Pulse 76   Ht 5\' 7"  (1.702 m)   Wt 163 lb 9.6 oz (74.2 kg)   BMI 25.62 kg/m   Constitutional:  Alert and oriented, No acute distress. HEENT: [place] AT, moist mucus membranes.  Trachea midline, no masses. Cardiovascular: No clubbing, cyanosis, or edema. Respiratory: Normal respiratory effort, no increased work of breathing. Skin: No rashes, bruises or suspicious lesions. Neurologic: Grossly intact, no focal deficits, moving all 4 extremities. Psychiatric: Normal mood and affect.  Laboratory Data: Lab Results  Component Value Date   WBC 8.6 05/06/2017   HGB 15.3 05/06/2017   HCT 46.0 05/06/2017   MCV 90.6 05/06/2017   PLT 88 (L) 05/06/2017    Lab Results  Component Value Date   CREATININE 0.69 04/10/2017    Lab Results  Component Value Date   PSA1 5.2 (H) 02/22/2017   Urinalysis N/a  Pertinent Imaging: N/a  Assessment & Plan:    1. Prostate cancer [facility]) Radical prostatectomy with bilateral pelvic lymph node dissection previously discussed in detail Not interested in radiation All questions answered today in detail Plan to reschedule surgery  2. Thrombocytopenia ([facility]) Thrombocytopenia of unclear etiology status post extensive hematologic workup, recommendations from Dr. Tasia Catchings Currently 88 after prednisone Hematology office to see if we can transfuse platelets the day prior to surgery, recheck on the morning of surgery and transfuse additional platelets as needed Donald Mendoza is agreeable with this plan, understands possible increased bleeding risk  Reschedule surgery  Hollice Espy, MD  [place] [address], [place], [place] [address] [phone]

## 2017-05-08 ENCOUNTER — Other Ambulatory Visit: Payer: Self-pay | Admitting: Nurse Practitioner

## 2017-05-08 ENCOUNTER — Other Ambulatory Visit: Payer: Self-pay | Admitting: Radiology

## 2017-05-23 ENCOUNTER — Encounter
Admission: RE | Admit: 2017-05-23 | Discharge: 2017-05-23 | Disposition: A | Discharge status: Home / Self Care | Payer: BLUE CROSS/BLUE SHIELD | Source: Ambulatory Visit | Attending: Urology | Admitting: Urology

## 2017-05-24 NOTE — Pre-Procedure Instructions (Signed)
CALLED PT TO INSTRUCT HIM NOT TO TAKE ANY NSAIDS OR ASA PRODUCTS STARTING TODAY.   TYLENOL OK TO TAKE IF NEEDED. PT VERBALIZED UNDERSTANDING AND IS AWARE THAT HE WILL BE GETTING A PHONE CALL Sunday TO BE ADMITTED  PRIOR TO HIS SURGERY ON Monday FOR LABS AND POSSIBLE PLATELETS

## 2017-05-26 ENCOUNTER — Other Ambulatory Visit: Payer: Self-pay

## 2017-05-26 ENCOUNTER — Observation Stay
Admission: RE | Admit: 2017-05-26 | Discharge: 2017-05-28 | Disposition: A | Discharge status: Home / Self Care | Payer: BLUE CROSS/BLUE SHIELD | Source: Ambulatory Visit | Attending: Urology | Admitting: Urology

## 2017-05-26 DIAGNOSIS — Z803 Family history of malignant neoplasm of breast: Secondary | ICD-10-CM | POA: Insufficient documentation

## 2017-05-26 DIAGNOSIS — Z87891 Personal history of nicotine dependence: Secondary | ICD-10-CM | POA: Diagnosis not present

## 2017-05-26 DIAGNOSIS — C61 Malignant neoplasm of prostate: Secondary | ICD-10-CM | POA: Diagnosis not present

## 2017-05-26 DIAGNOSIS — D696 Thrombocytopenia, unspecified: Secondary | ICD-10-CM | POA: Diagnosis not present

## 2017-05-26 DIAGNOSIS — E7800 Pure hypercholesterolemia, unspecified: Secondary | ICD-10-CM | POA: Diagnosis not present

## 2017-05-26 LAB — CBC WITH DIFFERENTIAL/PLATELET
BASOS PCT: 0 %
Basophils Absolute: 0 10*3/uL (ref 0–0.1)
Eosinophils Absolute: 0.1 10*3/uL (ref 0–0.7)
Eosinophils Relative: 2 %
HEMATOCRIT: 41.8 % (ref 40.0–52.0)
Hemoglobin: 14 g/dL (ref 13.0–18.0)
LYMPHS ABS: 1.6 10*3/uL (ref 1.0–3.6)
Lymphocytes Relative: 30 %
MCH: 30 pg (ref 26.0–34.0)
MCHC: 33.4 g/dL (ref 32.0–36.0)
MCV: 89.6 fL (ref 80.0–100.0)
Monocytes Absolute: 0.3 10*3/uL (ref 0.2–1.0)
Monocytes Relative: 7 %
NEUTROS ABS: 3.2 10*3/uL (ref 1.4–6.5)
NEUTROS PCT: 61 %
Platelets: 88 10*3/uL — ABNORMAL LOW (ref 150–440)
RBC: 4.67 MIL/uL (ref 4.40–5.90)
RDW: 14.2 % (ref 11.5–14.5)
WBC: 5.2 10*3/uL (ref 3.8–10.6)

## 2017-05-26 LAB — BASIC METABOLIC PANEL
ANION GAP: 4 — AB (ref 5–15)
BUN: 12 mg/dL (ref 6–20)
CHLORIDE: 106 mmol/L (ref 101–111)
CO2: 29 mmol/L (ref 22–32)
CREATININE: 0.89 mg/dL (ref 0.61–1.24)
Calcium: 9 mg/dL (ref 8.9–10.3)
GFR calc non Af Amer: >60 mL/min (ref >60)
Glucose, Bld: 103 mg/dL — ABNORMAL HIGH (ref 65–99)
Potassium: 3.7 mmol/L (ref 3.5–5.1)
SODIUM: 139 mmol/L (ref 135–145)

## 2017-05-26 LAB — TYPE AND SCREEN
ABO/RH(D): B POS
ANTIBODY SCREEN: NEGATIVE

## 2017-05-26 LAB — SURGICAL PCR SCREEN
MRSA, PCR: NEGATIVE
Staphylococcus aureus: NEGATIVE

## 2017-05-26 MED ORDER — ACETAMINOPHEN 325 MG PO TABS: 650.0000 mg | ORAL_TABLET | ORAL | Status: DC | PRN

## 2017-05-26 MED ORDER — MORPHINE SULFATE (PF) 2 MG/ML IV SOLN
2.0000 mg | INTRAVENOUS | Status: DC | PRN
  Administered 2017-05-27: 2 mg via INTRAVENOUS
  Filled 2017-05-26: qty 1

## 2017-05-26 MED ORDER — SODIUM CHLORIDE 0.9 % IV SOLN
Freq: Once | INTRAVENOUS | Status: AC
  Administered 2017-05-26: 22:00:00 via INTRAVENOUS

## 2017-05-26 MED ORDER — DIPHENHYDRAMINE HCL 50 MG/ML IJ SOLN: 12.5000 mg | Freq: Four times a day (QID) | INTRAMUSCULAR | Status: DC | PRN

## 2017-05-26 MED ORDER — ONDANSETRON HCL 4 MG/2ML IJ SOLN: 4.0000 mg | INTRAMUSCULAR | Status: DC | PRN

## 2017-05-26 MED ORDER — SODIUM CHLORIDE 0.9 % IV SOLN
INTRAVENOUS | Status: DC
  Administered 2017-05-26: 15:00:00 via INTRAVENOUS

## 2017-05-26 MED ORDER — CEFAZOLIN SODIUM-DEXTROSE 2-4 GM/100ML-% IV SOLN
2.0000 g | INTRAVENOUS | Status: AC
  Administered 2017-05-27: 1 g via INTRAVENOUS
  Administered 2017-05-27: 2 g via INTRAVENOUS
  Filled 2017-05-26: qty 100

## 2017-05-26 MED ORDER — OXYCODONE-ACETAMINOPHEN 5-325 MG PO TABS
1.0000 | ORAL_TABLET | ORAL | Status: DC | PRN
  Administered 2017-05-27 – 2017-05-28 (×4): 2 via ORAL
  Filled 2017-05-26 (×4): qty 2

## 2017-05-26 MED ORDER — DIPHENHYDRAMINE HCL 12.5 MG/5ML PO ELIX
12.5000 mg | ORAL_SOLUTION | Freq: Four times a day (QID) | ORAL | Status: DC | PRN
  Filled 2017-05-26: qty 5

## 2017-05-26 MED ORDER — DOCUSATE SODIUM 100 MG PO CAPS
100.0000 mg | ORAL_CAPSULE | Freq: Two times a day (BID) | ORAL | Status: DC
  Administered 2017-05-26 – 2017-05-28 (×4): 100 mg via ORAL
  Filled 2017-05-26 (×4): qty 1

## 2017-05-26 NOTE — H&P (Signed)
Urology Admission  Chief Complaint: prostate cancer/thrombocytopenia  History of Present Illness: Donald Mendoza is a 57 y.o. year with intermediate risk prostate cancer previously scheduled to undergo robotic radical prostatectomy.  On the day of the procedure, his platelets were noted to be in the 60s.  His surgery was canceled and since that time, he had extensive hematologic workup with Dr. Tasia Catchings.  He was treated with prednisone without significant increase of his platelets, most recent 1.  At this point in time, he was cleared for surgery with plans for platelet transfusion prior to the procedure for which he is being preadmitted today.  In terms of prostate cancer, he was initally diagnosed with low risk prostate cancer, Gleason 3+3 in 1 of 12 cores in 07/2016 at which time his PSA was 4. His PSA continues to rise up to 5.2 in October 2018. He underwent repeat prostate biopsy revealing Gleason 4+3 in 3 of 12 cores, all on the right side involving up to 12% of the tissue. Rectal examunremarkable. TRUS vol 30g.  He has a personal hx of umbilical and inguinal hernia repairs.He is unsure if mesh was used.  See previous notes for details.  He continues to strongly be interested in surgery rather than radiation.   Past Medical History:  Diagnosis Date  . B12 deficiency 04/21/2017  . Cancer [facility]) 2018   Prostate Cancer  . Diverticulosis   . GERD (gastroesophageal reflux disease)   . Hypercholesteremia     Past Surgical History:  Procedure Laterality Date  . COLONOSCOPY    . HERNIA REPAIR     10 years ago, [place]. umbilical hernia x 2    Home Medications:  Current Meds  Medication Sig  . latanoprost (XALATAN) 0.005 % ophthalmic solution Place 1 drop at bedtime into both eyes.   . pravastatin (PRAVACHOL) 40 MG tablet Take 1 tablet (40 mg total) by mouth daily.    Allergies:  Allergies  Allergen Reactions  . Sulfa Antibiotics Hives    Family History  Problem Relation  Age of Onset  . Diabetes Mother   . Breast cancer Mother   . Breast cancer Sister     Social History:  reports that he quit smoking about 7 weeks ago. His smoking use included cigarettes. He has a 17.00 pack-year smoking history. he has never used smokeless tobacco. He reports that he drinks about 1.2 oz of alcohol per week. He reports that he does not use drugs.  ROS: A complete review of systems was performed.  All systems are negative except for pertinent findings as noted.  Physical Exam:  Vital signs in last 24 hours: Temp:  [97.8 F (36.6 C)] 97.8 F (36.6 C) (01/06 1309) Pulse Rate:  [64] 64 (01/06 1309) Resp:  [18] 18 (01/06 1309) BP: (108)/(68) 108/68 (01/06 1309) SpO2:  [100 %] 100 % (01/06 1309) Constitutional:  Alert and oriented, No acute distress HEENT: [place] AT, moist mucus membranes.  Trachea midline, no masses Cardiovascular: Regular rate and rhythm, no clubbing, cyanosis, or edema. Respiratory: Normal respiratory effort, lungs clear bilaterally GI: Abdomen is soft, nontender, nondistended, no abdominal masses GU: No CVA tenderness Skin: No rashes, bruises or suspicious lesions Neurologic: Grossly intact, no focal deficits, moving all 4 extremities Psychiatric: Normal mood and affect   Laboratory Data:  Recent Labs    05/26/17 1454  WBC 5.2  HGB 14.0  HCT 41.8   Recent Labs    05/26/17 1454  NA 139  K  3.7  CL 106  CO2 29  GLUCOSE 103*  BUN 12  CREATININE 0.89  CALCIUM 9.0   No results for input(s): LABPT, INR in the last 72 hours. No results for input(s): LABURIN in the last 72 hours. Results for orders placed or performed during the hospital encounter of 04/01/17  Urine culture     Status: None   Collection Time: 04/01/17  3:18 PM  Result Value Ref Range Status   Specimen Description URINE, RANDOM  Final   Special Requests NONE  Final   Culture   Final    NO GROWTH Performed at [facility], [address], [place], [place] [address]     Report Status 04/02/2017 FINAL  Final     Radiologic Imaging: No results found.  1. Prostate cancer [facility]) Radical prostatectomy with bilateral pelvic lymph node dissection previously discussed in detail Not interested in radiation All questions answered today in detail Plan to for surgery tomorrow NPO at [facility]  2. Thrombocytopenia (HCC) Thrombocytopenia of unclear etiology status post extensive hematologic workup, recommendations from Dr. Tasia Catchings Currently 88 after prednisone  --> repeat today pending Plan to transfuse 2 units of platelets today, recheck later this evening to ensure platelets satisfactory   05/26/2017, 4:00 PM  Hollice Espy,  MD

## 2017-05-27 ENCOUNTER — Inpatient Hospital Stay: Payer: BLUE CROSS/BLUE SHIELD | Admitting: Anesthesiology

## 2017-05-27 ENCOUNTER — Encounter: Payer: Self-pay | Admitting: *Deleted

## 2017-05-27 ENCOUNTER — Encounter
Admission: RE | Disposition: A | Discharge status: Home / Self Care | Payer: Self-pay | Source: Ambulatory Visit | Attending: Urology

## 2017-05-27 DIAGNOSIS — D696 Thrombocytopenia, unspecified: Secondary | ICD-10-CM | POA: Diagnosis not present

## 2017-05-27 DIAGNOSIS — C61 Malignant neoplasm of prostate: Secondary | ICD-10-CM | POA: Diagnosis not present

## 2017-05-27 DIAGNOSIS — Z803 Family history of malignant neoplasm of breast: Secondary | ICD-10-CM | POA: Diagnosis not present

## 2017-05-27 DIAGNOSIS — Z87891 Personal history of nicotine dependence: Secondary | ICD-10-CM | POA: Diagnosis not present

## 2017-05-27 DIAGNOSIS — R920 Mammographic microcalcification found on diagnostic imaging of breast: Secondary | ICD-10-CM | POA: Diagnosis not present

## 2017-05-27 DIAGNOSIS — E7800 Pure hypercholesterolemia, unspecified: Secondary | ICD-10-CM | POA: Diagnosis not present

## 2017-05-27 HISTORY — PX: ROBOT ASSISTED LAPAROSCOPIC RADICAL PROSTATECTOMY: SHX5141

## 2017-05-27 HISTORY — PX: PELVIC LYMPH NODE DISSECTION: SHX6543

## 2017-05-27 LAB — CBC WITH DIFFERENTIAL/PLATELET
BASOS ABS: 0 10*3/uL (ref 0–0.1)
BASOS PCT: 0 %
EOS ABS: 0.1 10*3/uL (ref 0–0.7)
Eosinophils Relative: 3 %
HCT: 39.9 % — ABNORMAL LOW (ref 40.0–52.0)
HEMOGLOBIN: 13.4 g/dL (ref 13.0–18.0)
Lymphocytes Relative: 27 %
Lymphs Abs: 1.3 10*3/uL (ref 1.0–3.6)
MCH: 31.5 pg (ref 26.0–34.0)
MCHC: 33.7 g/dL (ref 32.0–36.0)
MCV: 93.4 fL (ref 80.0–100.0)
MONOS PCT: 8 %
Monocytes Absolute: 0.4 10*3/uL (ref 0.2–1.0)
NEUTROS PCT: 62 %
Neutro Abs: 3.1 10*3/uL (ref 1.4–6.5)
Platelets: 153 10*3/uL (ref 150–440)
RBC: 4.27 MIL/uL — ABNORMAL LOW (ref 4.40–5.90)
RDW: 14 % (ref 11.5–14.5)
WBC: 4.9 10*3/uL (ref 3.8–10.6)

## 2017-05-27 SURGERY — ROBOTIC ASSISTED LAPAROSCOPIC RADICAL PROSTATECTOMY
Anesthesia: Choice | Site: Prostate | Wound class: Clean Contaminated

## 2017-05-27 MED ORDER — DEXAMETHASONE SODIUM PHOSPHATE 4 MG/ML IJ SOLN
INTRAMUSCULAR | Status: DC | PRN
  Administered 2017-05-27: 10 mg via INTRAVENOUS

## 2017-05-27 MED ORDER — SUGAMMADEX SODIUM 200 MG/2ML IV SOLN
INTRAVENOUS | Status: AC
  Filled 2017-05-27: qty 2

## 2017-05-27 MED ORDER — ROCURONIUM BROMIDE 100 MG/10ML IV SOLN
INTRAVENOUS | Status: DC | PRN
  Administered 2017-05-27 (×3): 50 mg via INTRAVENOUS
  Administered 2017-05-27: 30 mg via INTRAVENOUS

## 2017-05-27 MED ORDER — PRAVASTATIN SODIUM 20 MG PO TABS
40.0000 mg | ORAL_TABLET | Freq: Every day | ORAL | Status: DC
  Administered 2017-05-27 – 2017-05-28 (×2): 40 mg via ORAL
  Filled 2017-05-27 (×2): qty 2

## 2017-05-27 MED ORDER — ROCURONIUM BROMIDE 50 MG/5ML IV SOLN
INTRAVENOUS | Status: AC
  Filled 2017-05-27: qty 1

## 2017-05-27 MED ORDER — PROPOFOL 10 MG/ML IV BOLUS
INTRAVENOUS | Status: DC | PRN
  Administered 2017-05-27: 150 mg via INTRAVENOUS

## 2017-05-27 MED ORDER — BELLADONNA ALKALOIDS-OPIUM 16.2-60 MG RE SUPP: 1.0000 | Freq: Four times a day (QID) | RECTAL | Status: DC | PRN

## 2017-05-27 MED ORDER — LIDOCAINE HCL (PF) 2 % IJ SOLN
INTRAMUSCULAR | Status: AC
  Filled 2017-05-27: qty 10

## 2017-05-27 MED ORDER — BUPIVACAINE HCL 0.5 % IJ SOLN
INTRAMUSCULAR | Status: DC | PRN
  Administered 2017-05-27: 30 mL

## 2017-05-27 MED ORDER — LATANOPROST 0.005 % OP SOLN
1.0000 [drp] | Freq: Every day | OPHTHALMIC | Status: DC
  Administered 2017-05-27: 1 [drp] via OPHTHALMIC
  Filled 2017-05-27: qty 2.5

## 2017-05-27 MED ORDER — CEFAZOLIN SODIUM 1 G IJ SOLR
INTRAMUSCULAR | Status: AC
  Filled 2017-05-27: qty 10

## 2017-05-27 MED ORDER — LACTATED RINGERS IV SOLN
INTRAVENOUS | Status: DC | PRN
  Administered 2017-05-27 (×2): via INTRAVENOUS

## 2017-05-27 MED ORDER — ONDANSETRON HCL 4 MG/2ML IJ SOLN: 4.0000 mg | Freq: Once | INTRAMUSCULAR | Status: DC | PRN

## 2017-05-27 MED ORDER — FENTANYL CITRATE (PF) 100 MCG/2ML IJ SOLN
INTRAMUSCULAR | Status: AC
  Administered 2017-05-27: 25 ug via INTRAVENOUS
  Filled 2017-05-27: qty 2

## 2017-05-27 MED ORDER — ONDANSETRON HCL 4 MG/2ML IJ SOLN
INTRAMUSCULAR | Status: DC | PRN
  Administered 2017-05-27: 4 mg via INTRAVENOUS

## 2017-05-27 MED ORDER — BUPIVACAINE HCL (PF) 0.5 % IJ SOLN
INTRAMUSCULAR | Status: AC
  Filled 2017-05-27: qty 30

## 2017-05-27 MED ORDER — SUGAMMADEX SODIUM 200 MG/2ML IV SOLN
INTRAVENOUS | Status: DC | PRN
  Administered 2017-05-27: 350 mg via INTRAVENOUS

## 2017-05-27 MED ORDER — LIDOCAINE HCL (CARDIAC) 20 MG/ML IV SOLN
INTRAVENOUS | Status: DC | PRN
  Administered 2017-05-27: 100 mg via INTRAVENOUS

## 2017-05-27 MED ORDER — FENTANYL CITRATE (PF) 100 MCG/2ML IJ SOLN
25.0000 ug | INTRAMUSCULAR | Status: AC | PRN
  Administered 2017-05-27 (×6): 25 ug via INTRAVENOUS

## 2017-05-27 MED ORDER — THROMBIN (RECOMBINANT) 5000 UNITS EX SOLR
CUTANEOUS | Status: DC | PRN
  Administered 2017-05-27: 5000 [IU] via TOPICAL

## 2017-05-27 MED ORDER — ACETAMINOPHEN 10 MG/ML IV SOLN
INTRAVENOUS | Status: DC | PRN
  Administered 2017-05-27: 1000 mg via INTRAVENOUS

## 2017-05-27 MED ORDER — LABETALOL HCL 5 MG/ML IV SOLN
INTRAVENOUS | Status: DC | PRN
  Administered 2017-05-27 (×2): 10 mg via INTRAVENOUS

## 2017-05-27 MED ORDER — THROMBIN (RECOMBINANT) 5000 UNITS EX SOLR
CUTANEOUS | Status: AC
  Filled 2017-05-27: qty 5000

## 2017-05-27 MED ORDER — HEPARIN SODIUM (PORCINE) 5000 UNIT/ML IJ SOLN: 5000.0000 [IU] | Freq: Three times a day (TID) | INTRAMUSCULAR | Status: DC

## 2017-05-27 MED ORDER — FENTANYL CITRATE (PF) 250 MCG/5ML IJ SOLN
INTRAMUSCULAR | Status: AC
  Filled 2017-05-27: qty 5

## 2017-05-27 MED ORDER — OXYBUTYNIN CHLORIDE 5 MG PO TABS
5.0000 mg | ORAL_TABLET | Freq: Three times a day (TID) | ORAL | Status: DC | PRN
Start: 2017-05-27 — End: 2017-05-28

## 2017-05-27 MED ORDER — PROPOFOL 10 MG/ML IV BOLUS
INTRAVENOUS | Status: AC
Start: 2017-05-27 — End: ?
  Filled 2017-05-27: qty 20

## 2017-05-27 MED ORDER — SODIUM CHLORIDE 0.9 % IV SOLN
INTRAVENOUS | Status: DC
  Administered 2017-05-27 – 2017-05-28 (×3): via INTRAVENOUS

## 2017-05-27 MED ORDER — MIDAZOLAM HCL 2 MG/2ML IJ SOLN
INTRAMUSCULAR | Status: AC
Start: 2017-05-27 — End: ?
  Filled 2017-05-27: qty 2

## 2017-05-27 MED ORDER — FENTANYL CITRATE (PF) 100 MCG/2ML IJ SOLN
INTRAMUSCULAR | Status: DC | PRN
  Administered 2017-05-27 (×2): 150 ug via INTRAVENOUS
  Administered 2017-05-27: 100 ug via INTRAVENOUS
  Administered 2017-05-27: 250 ug via INTRAVENOUS
  Administered 2017-05-27: 100 ug via INTRAVENOUS

## 2017-05-27 MED ORDER — HEPARIN SODIUM (PORCINE) 5000 UNIT/ML IJ SOLN
5000.0000 [IU] | Freq: Three times a day (TID) | INTRAMUSCULAR | Status: DC
  Administered 2017-05-27 – 2017-05-28 (×2): 5000 [IU] via SUBCUTANEOUS
  Filled 2017-05-27 (×2): qty 1

## 2017-05-27 MED ORDER — ACETAMINOPHEN 10 MG/ML IV SOLN
INTRAVENOUS | Status: AC
  Filled 2017-05-27: qty 100

## 2017-05-27 SURGICAL SUPPLY — 98 items
ANCHOR TIS RET SYS 235ML (MISCELLANEOUS) IMPLANT
APPLICATOR SURGIFLO ENDO (HEMOSTASIS) ×3 IMPLANT
APPLIER CLIP LOGIC TI 5 (MISCELLANEOUS) IMPLANT
BAG URINE DRAINAGE (UROLOGICAL SUPPLIES) ×3 IMPLANT
BLADE CLIPPER SURG (BLADE) ×3 IMPLANT
BULB RESERV EVAC DRAIN JP 100C (MISCELLANEOUS) IMPLANT
CANISTER SUCT 1200ML W/VALVE (MISCELLANEOUS) ×3 IMPLANT
CATH DRAINAGE MALECOT 26FR (CATHETERS) ×2 IMPLANT
CATH FOL 2WAY LX 18X5 (CATHETERS) ×3 IMPLANT
CATH MALECOT (CATHETERS) ×3
CHLORAPREP W/TINT 26ML (MISCELLANEOUS) ×6 IMPLANT
CLIP SUT LAPRA TY ABSORB (SUTURE) ×3 IMPLANT
CLIP VESOLOCK LG 6/CT PURPLE (CLIP) ×9 IMPLANT
CORD BIP STRL DISP 12FT (MISCELLANEOUS) ×3 IMPLANT
CORD MONOPOLAR M/FML 12FT (MISCELLANEOUS) ×3 IMPLANT
COVER TIP SHEARS 8 DVNC (MISCELLANEOUS) ×2 IMPLANT
COVER TIP SHEARS 8MM DA VINCI (MISCELLANEOUS) ×1
DEFOGGER SCOPE WARMER CLEARIFY (MISCELLANEOUS) ×3 IMPLANT
DERMABOND ADVANCED (GAUZE/BANDAGES/DRESSINGS) ×1
DERMABOND ADVANCED .7 DNX12 (GAUZE/BANDAGES/DRESSINGS) ×2 IMPLANT
DRAIN CHANNEL JP 15F RND 16 (MISCELLANEOUS) IMPLANT
DRAIN CHANNEL JP 19F (MISCELLANEOUS) IMPLANT
DRAPE LEGGINS SURG 28X43 STRL (DRAPES) ×3 IMPLANT
DRAPE SHEET LG 3/4 BI-LAMINATE (DRAPES) ×6 IMPLANT
DRAPE SURG 17X11 SM STRL (DRAPES) ×12 IMPLANT
DRAPE TABLE BACK 80X90 (DRAPES) ×3 IMPLANT
DRAPE UNDER BUTTOCK W/FLU (DRAPES) ×3 IMPLANT
DRIVER LRG NEEDLE DA VINCI (INSTRUMENTS) ×2
DRIVER NDLE LRG DVNC (INSTRUMENTS) ×4 IMPLANT
DRSG TELFA 3X8 NADH (GAUZE/BANDAGES/DRESSINGS) ×3 IMPLANT
ELECT REM PT RETURN 9FT ADLT (ELECTROSURGICAL) ×3
ELECTRODE REM PT RTRN 9FT ADLT (ELECTROSURGICAL) ×2 IMPLANT
FILTER LAP SMOKE EVAC STRL (MISCELLANEOUS) ×3 IMPLANT
FORCEPS MARYLAND BIPOLAR 8X55 (INSTRUMENTS) ×1
FORCEPS MRYLND BPLR 8X55 DVNC (INSTRUMENTS) ×2 IMPLANT
GLOVE BIO SURGEON STRL SZ 6.5 (GLOVE) ×9 IMPLANT
GOWN STRL REUS W/ TWL LRG LVL3 (GOWN DISPOSABLE) ×6 IMPLANT
GOWN STRL REUS W/TWL LRG LVL3 (GOWN DISPOSABLE) ×3
GRASPER SUT TROCAR 14GX15 (MISCELLANEOUS) ×3 IMPLANT
HEMOSTAT SURGICEL 2X14 (HEMOSTASIS) ×3 IMPLANT
HOLDER FOLEY CATH W/STRAP (MISCELLANEOUS) ×3 IMPLANT
IRRIGATION STRYKERFLOW (MISCELLANEOUS) ×2 IMPLANT
IRRIGATOR STRYKERFLOW (MISCELLANEOUS) ×3
IV LACTATED RINGERS 1000ML (IV SOLUTION) ×3 IMPLANT
JELLY LUB 2OZ STRL (MISCELLANEOUS) ×1
JELLY LUBE 2OZ STRL (MISCELLANEOUS) ×2 IMPLANT
KIT ACCESSORY DA VINCI DISP (KITS) ×1
KIT ACCESSORY DVNC DISP (KITS) ×2 IMPLANT
KIT PINK PAD W/HEAD ARE REST (MISCELLANEOUS) ×3
KIT PINK PAD W/HEAD ARM REST (MISCELLANEOUS) ×2 IMPLANT
LABEL OR SOLS (LABEL) ×3 IMPLANT
MARKER SKIN DUAL TIP RULER LAB (MISCELLANEOUS) ×3 IMPLANT
NEEDLE HYPO 22GX1.5 SAFETY (NEEDLE) ×3 IMPLANT
NEEDLE INSUFFLATION 14GA 120MM (NEEDLE) ×3 IMPLANT
NS IRRIG 500ML POUR BTL (IV SOLUTION) ×3 IMPLANT
PACK LAP CHOLECYSTECTOMY (MISCELLANEOUS) ×3 IMPLANT
PENCIL ELECTRO HAND CTR (MISCELLANEOUS) ×3 IMPLANT
PROGRASP ENDOWRIST DA VINCI (INSTRUMENTS) ×1
PROGRASP ENDOWRIST DVNC (INSTRUMENTS) ×2 IMPLANT
RELOAD STAPLER WHITE 60MM (STAPLE) IMPLANT
SCISSORS METZENBAUM CVD 33 (INSTRUMENTS) ×3 IMPLANT
SLEEVE ENDOPATH XCEL 5M (ENDOMECHANICALS) ×6 IMPLANT
SOLUTION ELECTROLUBE (MISCELLANEOUS) ×3 IMPLANT
SPOGE SURGIFLO 8M (HEMOSTASIS) ×1
SPONGE LAP 4X18 5PK (MISCELLANEOUS) IMPLANT
SPONGE SURGIFLO 8M (HEMOSTASIS) ×2 IMPLANT
SPONGE VERSALON 4X4 4PLY (MISCELLANEOUS) ×3 IMPLANT
STAPLE ECHEON FLEX 60 POW ENDO (STAPLE) IMPLANT
STAPLER RELOAD WHITE 60MM (STAPLE)
STAPLER SKIN PROX 35W (STAPLE) ×3 IMPLANT
STRAP SAFETY BODY (MISCELLANEOUS) ×3 IMPLANT
SUT DVC VLOC 90 3-0 CV23 UNDY (SUTURE) ×6 IMPLANT
SUT DVC VLOC 90 3-0 CV23 VLT (SUTURE) ×9
SUT ETHILON 3-0 FS-10 30 BLK (SUTURE)
SUT MNCRL 4-0 (SUTURE) ×2
SUT MNCRL 4-0 27XMFL (SUTURE) ×4
SUT PDS AB 1 CT1 27 (SUTURE) ×3 IMPLANT
SUT PROLENE 5 0 RB 1 DA (SUTURE) IMPLANT
SUT SILK 2 0 SH (SUTURE) ×3 IMPLANT
SUT VIC AB 0 CT1 36 (SUTURE) ×6 IMPLANT
SUT VIC AB 2-0 CT1 (SUTURE) ×6 IMPLANT
SUT VIC AB 2-0 SH 27 (SUTURE) ×2
SUT VIC AB 2-0 SH 27XBRD (SUTURE) ×4 IMPLANT
SUT VICRYL 0 AB UR-6 (SUTURE) ×3 IMPLANT
SUTURE DVC VLC 90 3-0 CV23 VLT (SUTURE) ×6 IMPLANT
SUTURE EHLN 3-0 FS-10 30 BLK (SUTURE) IMPLANT
SUTURE MNCRL 4-0 27XMF (SUTURE) ×4 IMPLANT
SYR 10ML LL (SYRINGE) ×3 IMPLANT
SYR BULB IRRIG 60ML STRL (SYRINGE) IMPLANT
SYRINGE IRR TOOMEY STRL 70CC (SYRINGE) ×3 IMPLANT
TAPE CLOTH 10X20 WHT NS LF (TAPE) ×4 IMPLANT
TAPE CLOTH 2X10 WHT NS LF (TAPE) ×2
TROCAR DISP BLADELESS 8 DVNC (TROCAR) ×2 IMPLANT
TROCAR DISP BLADELESS 8MM (TROCAR) ×1
TROCAR ENDOPATH XCEL 12X100 BL (ENDOMECHANICALS) ×6 IMPLANT
TROCAR XCEL 12X100 BLDLESS (ENDOMECHANICALS) ×3 IMPLANT
TROCAR XCEL NON-BLD 5MMX100MML (ENDOMECHANICALS) ×3 IMPLANT
TUBING INSUFFLATION (TUBING) ×3 IMPLANT

## 2017-05-27 NOTE — Op Note (Signed)
05/27/17  PREOPERATIVE DIAGNOSIS: Prostate cancer.  POSTOPERATIVE DIAGNOSIS: Prostate cancer.  OPERATION PERFORMED: 1. DaVinci laparoscopic radical prostatectomy (left full nerve sparing, right partial nerve sparing) 2 DaVinci laproscopic bilateral pelvic lymph node dissection.  SURGEON: Hollice Espy, MD  ASSISTANTS: Liliane Bade, PA  ANESTHESIA: General.  EBL: 200 cc  SPECIMEN: Prostate with bilateral seminal vesicals, bilateral pelvic lymph nodes, anterior fat pad.  FINDINGS: Accessory Pudendal Vessel: none  INDICATION:  57 year old male with a history of prostate cancer involving the right side.  He reviewed his options and elected to undergo radical prostatectomy with bilateral pelvic lymph node dissection due to risk stratification.  He has excellent baseline erections and will undergo a left-sided nerve sparing with right sided partial nerve sparing.    PROCEDURE IN DETAIL: Patient was given Ancef preoperatively. He had sequential compression devices applied preoperatively for DVT prophylaxis. He was taken to the operating room where he was induced with general anesthesia. After adequate anesthesia, he was placed in the dorsal lithotomy position. His arms were draped by his side and was appropriately padded and secured to the operating room table. He was placed in the Trendelenburg position.  He was prepped and draped in sterile fashion. An 49 French Foley was placed in the bladder and instilled with 15 cc sterile water. Orogastric tube was placed. The Veress needle was passed just above 2 fingerbreadths below the costal margin in the midclavicular line on the left.  The abdomen was insufflated to 15 atmospheres.  A 5 mm Visiport was used to place the left robotic trocar under direct visualization.  Inspection of the abdomen previous mesh repair nurse.  A 12 mm, blunt-tip trocar was placed just above the umbilicus just anterior to the mesh. The zero-degree  camera was passed within this and the following trocars were placed under direct vision; 8 mm robotic trocars were placed 9 cm laterally and inferiorly to the initially placed umbilical trocar. A third one was placed 7 cm lateral to the left-sided trocar. In the corresponding position on the right side, a 12 mm trocar was placed, and then a 5 mm trocar was placed to the right and well above the umbilicus.  The robot was then docked with the robot trocar. I used the zero-degree camera. I had the hot scissors in the right hand and the left hand with the [place] bipolar and far left hand the Prograsp forceps. Initially I divided the median umbilical ligament bilaterally and the urachus and developed the space of Retzius down to pubic bone. I divided the parietal peritoneum laterally up to the vas deferens on each side.  Unfortunately, this dissection was somewhat complicated due to the presence of umbilical mesh but primarily due to the presence of a left inguinal hernia mesh which the bladder was quite adherent.  There is also neovascularity appreciated.  Ultimately, I was able to take down the bladder but with some difficulty.  I cleaned off the Endopelvic fascia on each side and then divided it with the scissors laterally to the perirectal fat and medially to the puboprostatic ligaments which were divided. I then ligated the dorsal vein complex using a 0 Vicryl suture in a figure-of-eight on a CT1 needle.  I then addressed the bladder neck with a 30-degree down lens. I identified the bladder neck by pulling on the Foley catheter. I divided the anterior bladder neck musculature until I then found the anterior bladder neck mucosa which was incised. I identified the Foley catheter within, deflated the balloon,  pulled the Foley out through this opening and then using the Carter-Thomason needle with a #0-Vicryl suture, passed through The suprapubic region and pulled the suture through the eye  of the Foley and then back out. This allowed me to provide upward traction on the prostate. I then divided the lateral bladder neck mucosa and the posterior bladder neck mucosa. I was well away from ureteral orifices. I divided the posterior bladder neck musculature until I identified the vas deferens. They were freed proximally, then divided. I freed up the seminal vesicals using blunt and sharp dissection. Extremely judicious use of electrocautery was used near the seminal vesicle tips to avoid injury to neurovascular bundle.   I then went back to the 0-degree lens. I divided the Denonvilliers fascia beneath the prostate and developed the prostate off the rectum.  I then did a left-sided full nerve sparing procedure and a right-sided partial nerve sparing procedure using an intrafascial plane. I then isolated the pedicles of the prostate and placed weck clips on the pedicles of the prostate and then divided it with cold scissors. I continued to divide the neurovascular bundles off the prostate out to the apex of the prostate. At this point the prostate was freed up except for the urethra. I addressed the prostate anteriorly, divided the dorsal vein , then the anterior urethral wall, pulled the Foley catheter back and then divided the posterior urethral wall. Specimen was completely freed up. I placed the prostate in an Endo catch bag and then placed the bag in the upper abdomen out of the way. I then irrigated the pelvis. The rectal test was negative.  There is some mild oozing at the DVC soon additional 2-0 Vicryl figure-of-eight was used here.  I also applied some Surgi-Flo at this point time the diffuse ooze.  I then did the pelvic lymph node dissection by incising the fascia overlying the right external iliac vein, dissecting distally. I went just distal to the node of [place] where we placed clips and then divided the lymphatics. The lateral aspect of the dissection was the pelvic  side wall, inferior was the obturator nerve and proximal the hypogastric vessels. I placed clips at the proximal aspect and then divided the lymphatics. This was removed with the spoon grasper and sent to pathology.   On the left, due to presence of mesh and abundance of scar tissue, only small amount of lymphatic tissue was able to be safely removed adjacent to the left.  Given the distorted anatomy here, I had concerns about dissecting down to the obturator fossa thus this was not performed.  With good hemostasis, I then did the posterior reconstruction. I used a 3-0 VLoc suture on an RB1 through the cut edge of Denonvilliers fascia beneath the bladder on the right side and through the posterior striated sphincter underneath the urethra. This brought the bladder neck and urethra and closer proximity to help facilitate anastomosis.   I then did the urethral vesicle anastomosis again with two 3-0 VLoc sutures on an RB1 needle interlocked. I passed both ends of the suture from the outside-in through the bladder neck at the 6 o'clock position. I passed both through the urethral stump from the inside-out in the corresponding position. I reapproximated the bladder neck to the urethra. I then ran the Left suture on the left side anastomosis to the 9 o'clock position. Then I went back to the right sided suture and ran that up the right side to the 12 o'clock position.  I then continued the left suture to the 12 o'clock position.The suture was then suspended anteriorly behind the pubic bone.   I then placed a new 78 French Foley into the bladder and filled it with 10 cc sterile water. I irrigated the bladder with 160 cc. There was minimal leak although a scant amount of fluid did well up in the left posterior anastomotic site concerning for very minimal small leak.. There was reasonable hemostasis.  Surgicel was used on either side of the pedicles for an additional hemostasis.  The instruments  were then removed.  A round Blake drain was placed through the fourth arm port site to the dependent pelvis.  The robot was undocked and all the trocars were removed under direct vision. There was good Hemostasis.  The 12 mm assistant port was closed using a [employer] needle using a 0 Vicryl stitch.  I then enlarged the umbilical trocar site large enough to remove the prostate and I closed the fascia here with #0-1 PDSsuture in a running fashion. All the port sites were irrigated. Lidocaine was injected into all the trocar sites. The skin was closed with 4-0 Monocryl in running subcuticular fashion. Dermabond was applied.   At this point patient was awakened and extubated in the operating room and taken to the recovery room in stable condition. There were no complications. All counts correct.  Hollice Espy, MD

## 2017-05-27 NOTE — Anesthesia Postprocedure Evaluation (Signed)
Anesthesia Post Note  Patient: TAKEO HARTS  Procedure(s) Performed: ROBOTIC ASSISTED LAPAROSCOPIC RADICAL PROSTATECTOMY (N/A Prostate) PELVIC LYMPH NODE DISSECTION (Bilateral Abdomen)  Patient location during evaluation: PACU Anesthesia Type: General Level of consciousness: awake and alert Pain management: pain level controlled Vital Signs Assessment: post-procedure vital signs reviewed and stable Respiratory status: spontaneous breathing and respiratory function stable Cardiovascular status: stable Anesthetic complications: no     Last Vitals:  Vitals:   05/27/17 1520 05/27/17 1527  BP:  113/66  Pulse: (!) 59 67  Resp: 10 19  Temp:  (!) 36.4 C  SpO2: 98% 96%    Last Pain:  Vitals:   05/27/17 1527  TempSrc:   PainSc: 4                  Jacobus Colvin K

## 2017-05-27 NOTE — Transfer of Care (Signed)
Immediate Anesthesia Transfer of Care Note  Patient: Donald Mendoza  Procedure(s) Performed: ROBOTIC ASSISTED LAPAROSCOPIC RADICAL PROSTATECTOMY (N/A Prostate) PELVIC LYMPH NODE DISSECTION (Bilateral Abdomen)  Patient Location: PACU  Anesthesia Type:General  Level of Consciousness: awake and patient cooperative  Airway & Oxygen Therapy: Patient Spontanous Breathing and Patient connected to nasal cannula oxygen  Post-op Assessment: Report given to RN and Post -op Vital signs reviewed and stable  Post vital signs: Reviewed and stable  Last Vitals:  Vitals:   05/27/17 0819 05/27/17 0840  BP: 118/77 103/68  Pulse: (!) 55 (!) 55  Resp: 18 18  Temp: (!) 36.4 C (!) 36.3 C  SpO2: 100% 98%    Last Pain:  Vitals:   05/27/17 0840  TempSrc: Oral  PainSc: 0-No pain         Complications: No apparent anesthesia complications

## 2017-05-27 NOTE — Anesthesia Procedure Notes (Signed)
Procedure Name: Intubation Date/Time: 05/27/2017 9:52 AM Performed by: Bernardo Heater, CRNA Pre-anesthesia Checklist: Patient identified, Emergency Drugs available, Suction available and Patient being monitored Patient Re-evaluated:Patient Re-evaluated prior to induction Oxygen Delivery Method: Circle system utilized Preoxygenation: Pre-oxygenation with 100% oxygen Induction Type: IV induction Laryngoscope Size: Mac and 3 Grade View: Grade II Tube size: 7.0 mm Airway Equipment and Method: Stylet Placement Confirmation: ETT inserted through vocal cords under direct vision,  positive ETCO2 and breath sounds checked- equal and bilateral Secured at: 23 cm Tube secured with: Tape Dental Injury: Teeth and Oropharynx as per pre-operative assessment  Difficulty Due To: Difficult Airway- due to anterior larynx Future Recommendations: Recommend- induction with short-acting agent, and alternative techniques readily available

## 2017-05-27 NOTE — Interval H&P Note (Signed)
History and Physical Interval Note:  05/27/2017 9:12 AM  Donald Mendoza  has presented today for surgery, with the diagnosis of Prostate cancer  The various methods of treatment have been discussed with the patient and family. After consideration of risks, benefits and other options for treatment, the patient has consented to  Procedure(s): ROBOTIC ASSISTED LAPAROSCOPIC RADICAL PROSTATECTOMY (N/A) PELVIC LYMPH NODE DISSECTION (Bilateral) as a surgical intervention .  The patient's history has been reviewed, patient examined, no change in status, stable for surgery.  I have reviewed the patient's chart and labs.  Questions were answered to the patient's satisfaction.    RRR CTAB  Platelets appropriate today after transfusion.    Hollice Espy

## 2017-05-27 NOTE — Anesthesia Preprocedure Evaluation (Addendum)
Anesthesia Evaluation  Patient identified by MRN, date of birth, ID band Patient awake    Reviewed: Allergy & Precautions, NPO status , Patient's Chart, lab work & pertinent test results  History of Anesthesia Complications Negative for: history of anesthetic complications  Airway Mallampati: III       Dental   Pulmonary neg sleep apnea, neg COPD, former smoker,           Cardiovascular (-) hypertension(-) Past MI and (-) CHF (-) dysrhythmias (-) Valvular Problems/Murmurs     Neuro/Psych neg Seizures    GI/Hepatic Neg liver ROS, GERD  ,  Endo/Other  neg diabetes  Renal/GU negative Renal ROS     Musculoskeletal   Abdominal   Peds  Hematology   Anesthesia Other Findings   Reproductive/Obstetrics                            Anesthesia Physical Anesthesia Plan  ASA: II  Anesthesia Plan: General   Post-op Pain Management:    Induction: Intravenous  PONV Risk Score and Plan: 2 and Dexamethasone and Ondansetron  Airway Management Planned: Oral ETT  Additional Equipment:   Intra-op Plan:   Post-operative Plan:   Informed Consent: I have reviewed the patients History and Physical, chart, labs and discussed the procedure including the risks, benefits and alternatives for the proposed anesthesia with the patient or authorized representative who has indicated his/her understanding and acceptance.     Plan Discussed with:   Anesthesia Plan Comments:         Anesthesia Quick Evaluation

## 2017-05-27 NOTE — Anesthesia Post-op Follow-up Note (Signed)
Anesthesia QCDR form completed.        

## 2017-05-28 ENCOUNTER — Encounter: Payer: Self-pay | Admitting: Urology

## 2017-05-28 DIAGNOSIS — E7800 Pure hypercholesterolemia, unspecified: Secondary | ICD-10-CM | POA: Diagnosis not present

## 2017-05-28 DIAGNOSIS — Z87891 Personal history of nicotine dependence: Secondary | ICD-10-CM | POA: Diagnosis not present

## 2017-05-28 DIAGNOSIS — D696 Thrombocytopenia, unspecified: Secondary | ICD-10-CM | POA: Diagnosis not present

## 2017-05-28 DIAGNOSIS — C61 Malignant neoplasm of prostate: Secondary | ICD-10-CM | POA: Diagnosis not present

## 2017-05-28 DIAGNOSIS — Z803 Family history of malignant neoplasm of breast: Secondary | ICD-10-CM | POA: Diagnosis not present

## 2017-05-28 LAB — BASIC METABOLIC PANEL
Anion gap: 5 (ref 5–15)
BUN: 12 mg/dL (ref 6–20)
CHLORIDE: 109 mmol/L (ref 101–111)
CO2: 25 mmol/L (ref 22–32)
CREATININE: 1.06 mg/dL (ref 0.61–1.24)
Calcium: 8.7 mg/dL — ABNORMAL LOW (ref 8.9–10.3)
GFR calc Af Amer: >60 mL/min (ref >60)
GFR calc non Af Amer: >60 mL/min (ref >60)
GLUCOSE: 118 mg/dL — AB (ref 65–99)
Potassium: 4.4 mmol/L (ref 3.5–5.1)
Sodium: 139 mmol/L (ref 135–145)

## 2017-05-28 LAB — CBC
HEMATOCRIT: 37.9 % — AB (ref 40.0–52.0)
HEMOGLOBIN: 12.4 g/dL — AB (ref 13.0–18.0)
MCH: 30.3 pg (ref 26.0–34.0)
MCHC: 32.6 g/dL (ref 32.0–36.0)
MCV: 92.7 fL (ref 80.0–100.0)
Platelets: 156 10*3/uL (ref 150–440)
RBC: 4.08 MIL/uL — ABNORMAL LOW (ref 4.40–5.90)
RDW: 14.4 % (ref 11.5–14.5)
WBC: 8.9 10*3/uL (ref 3.8–10.6)

## 2017-05-28 LAB — BPAM PLATELET PHERESIS
BLOOD PRODUCT EXPIRATION DATE: 201901072059
BLOOD PRODUCT EXPIRATION DATE: 201901080257
ISSUE DATE / TIME: 201901062221
ISSUE DATE / TIME: 201901070338
UNIT TYPE AND RH: 6200
UNIT TYPE AND RH: 7300

## 2017-05-28 LAB — PREPARE PLATELET PHERESIS
Unit division: 0
Unit division: 0

## 2017-05-28 LAB — CREATININE, FLUID (PLEURAL, PERITONEAL, JP DRAINAGE): CREAT FL: 0.9 mg/dL

## 2017-05-28 MED ORDER — OXYCODONE-ACETAMINOPHEN 5-325 MG PO TABS: 1.0000 | ORAL_TABLET | ORAL | 0 refills | Status: DC | PRN

## 2017-05-28 MED ORDER — OXYBUTYNIN CHLORIDE 5 MG PO TABS: 5.0000 mg | ORAL_TABLET | Freq: Three times a day (TID) | ORAL | 0 refills | Status: DC | PRN

## 2017-05-28 MED ORDER — DOCUSATE SODIUM 100 MG PO CAPS: 100.0000 mg | ORAL_CAPSULE | Freq: Two times a day (BID) | ORAL | 0 refills | Status: DC

## 2017-05-28 NOTE — Progress Notes (Signed)
Donald Mendoza  A and O x 4. VSS. Pt tolerating diet well. No complaints of pain or nausea. IV removed intact, prescriptions given. Removed JP drain and given supplies for pt to take care of his foley catheter. Pt voiced understanding of discharge instructions with no further questions. Pt discharged via wheelchair with axillary.    Allergies as of 05/28/2017      Reactions   Sulfa Antibiotics Hives      Medication List    TAKE these medications   docusate sodium 100 MG capsule Commonly known as:  COLACE Take 1 capsule (100 mg total) by mouth 2 (two) times daily.   latanoprost 0.005 % ophthalmic solution Commonly known as:  XALATAN Place 1 drop at bedtime into both eyes.   oxybutynin 5 MG tablet Commonly known as:  DITROPAN Take 1 tablet (5 mg total) by mouth every 8 (eight) hours as needed for bladder spasms.   oxyCODONE-acetaminophen 5-325 MG tablet Commonly known as:  PERCOCET Take 1-2 tablets by mouth every 4 (four) hours as needed for moderate pain or severe pain.   pravastatin 40 MG tablet Commonly known as:  PRAVACHOL Take 1 tablet (40 mg total) by mouth daily.       Vitals:   05/28/17 0433 05/28/17 0924  BP: (!) 96/50 131/67  Pulse: 71 62  Resp: 16   Temp: 98.6 F (37 C) 98 F (36.7 C)  SpO2: 95% 100%    Francesco Sor

## 2017-05-28 NOTE — Discharge Instructions (Signed)
· Activity:  You are encouraged to ambulate frequently (about every hour during waking hours) to help prevent blood clots from forming in your legs or lungs.  However, you should not engage in any heavy lifting (> 5-10 lbs), strenuous activity, or straining. ° °· Diet: You should advance your diet as instructed by your physician.  It will be normal to have some bloating, nausea, and abdominal discomfort intermittently. ° °· Prescriptions:  You will be provided a prescription for pain medication to take as needed.  If your pain is not severe enough to require the prescription pain medication, you may take extra strength Tylenol instead which will have less side effects.  You should also take a prescribed stool softener to avoid straining with bowel movements as the prescription pain medication may constipate you. ° °· Incisions: You may remove your dressing bandages 48 hours after surgery if not removed in the hospital.  You will either have some small staples or special tissue glue at each of the incision sites. Once the bandages are removed (if present), the incisions may stay open to air.  You may start showering (but not soaking or bathing in water) the 2nd day after surgery and the incisions simply need to be patted dry after the shower.  No additional care is needed. ° °What to call us about: You should call the office if you develop fever > 101 or develop persistent vomiting, redness or draining around your incision, or any other concerning symptoms.   ° °[facility] °[address], [address] °[place], [place] [address] °[phone] ° ° °Indwelling Urinary Catheter Care, Adult °Take good care of your catheter to keep it working and to prevent problems. °How to wear your catheter °Attach your catheter to your leg with tape (adhesive tape) or a leg strap. Make sure it is not too tight. If you use tape, remove any bits of tape that are already on the catheter. °How to wear a drainage  bag °You should have: °· A large overnight bag. °· A small leg bag. ° °Overnight Bag °You may wear the overnight bag at any time. Always keep the bag below the level of your bladder but off the floor. When you sleep, put a clean plastic bag in a wastebasket. Then hang the bag inside the wastebasket. °Leg Bag °Never wear the leg bag at night. Always wear the leg bag below your knee. Keep the leg bag secure with a leg strap or tape. °How to care for your skin °· Clean the skin around the catheter at least once every day. °· Shower every day. Do not take baths. °· Put creams, lotions, or ointments on your genital area only as told by your doctor. °· Do not use powders, sprays, or lotions on your genital area. °How to clean your catheter and your skin °1. Wash your hands with soap and water. °2. Wet a washcloth in warm water and gentle (mild) soap. °3. Use the washcloth to clean the skin where the catheter enters your body. Clean downward and wipe away from the catheter in small circles. Do not wipe toward the catheter. °4. Pat the area dry with a clean towel. Make sure to clean off all soap. °How to care for your drainage bags °Empty your drainage bag when it is ?-½ full or at least 2-3 times a day. Replace your drainage bag once a month or sooner if it starts to smell bad or look dirty. Do not clean your   drainage bag unless told by your doctor. °Emptying a drainage bag ° °Supplies Needed °· Rubbing alcohol. °· Gauze pad or cotton ball. °· Tape or a leg strap. ° °Steps °1. Wash your hands with soap and water. °2. Separate (detach) the bag from your leg. °3. Hold the bag over the toilet or a clean container. Keep the bag below your hips and bladder. This stops pee (urine) from going back into the tube. °4. Open the pour spout at the bottom of the bag. °5. Empty the pee into the toilet or container. Do not let the pour spout touch any surface. °6. Put rubbing alcohol on a gauze pad or cotton ball. °7. Use the gauze pad  or cotton ball to clean the pour spout. °8. Close the pour spout. °9. Attach the bag to your leg with tape or a leg strap. °10. Wash your hands. ° °Changing a drainage bag °Supplies Needed °· Alcohol wipes. °· A clean drainage bag. °· Adhesive tape or a leg strap. ° °Steps °1. Wash your hands with soap and water. °2. Separate the dirty bag from your leg. °3. Pinch the rubber catheter with your fingers so that pee does not spill out. °4. Separate the catheter tube from the drainage tube where these tubes connect (at the connection valve). Do not let the tubes touch any surface. °5. Clean the end of the catheter tube with an alcohol wipe. Use a different alcohol wipe to clean the end of the drainage tube. °6. Connect the catheter tube to the drainage tube of the clean bag. °7. Attach the new bag to the leg with adhesive tape or a leg strap. °8. Wash your hands. ° °How to prevent infection and other problems °· Never pull on your catheter or try to remove it. Pulling can damage tissue in your body. °· Always wash your hands before and after touching your catheter. °· If a leg strap gets wet, replace it with a dry one. °· Drink enough fluids to keep your pee clear or pale yellow, or as told by your doctor. °· Do not let the drainage bag or tubing touch the floor. °· Wear cotton underwear. °· If you are male, wipe from front to back after you poop (have a bowel movement). °· Check on the catheter often to make sure it works and the tubing is not twisted. °Get help if: °· Your pee is cloudy. °· Your pee smells unusually bad. °· Your pee is not draining into the bag. °· Your tube gets clogged. °· Your catheter starts to leak. °· Your bladder feels full. °Get help right away if: °· You have redness, swelling, or pain where the catheter enters your body. °· You have fluid, pus, or a bad smell coming from the area where the catheter enters your body. °· The area where the catheter enters your body feels warm. °· You have a  fever. °· You have pain in your: °? Stomach (abdomen). °? Legs. °? Lower back. °? Bladder. °· You see blood fill the catheter. °· Your pee is pink or red. °· You feel sick to your stomach (nauseous). °· You throw up (vomit). °· You have chills. °· Your catheter gets pulled out. °This information is not intended to replace advice given to you by your health care provider. Make sure you discuss any questions you have with your health care provider. °Document Released: 09/01/2012 Document Revised: 04/04/2016 Document Reviewed: 10/20/2013 °Elsevier Interactive Patient Education © 2018 [employer] ° °

## 2017-05-28 NOTE — Progress Notes (Signed)
JP removed, pt tolerated well. Pt will go home with foley catheter, given supplies and taught pt how to take care of his foley catheter.

## 2017-05-28 NOTE — Discharge Summary (Signed)
Date of admission: 05/26/2017  Date of discharge: 05/28/2017  Admission diagnosis: Prostate cancer, thrombocytopenia  Discharge diagnosis: Same as above  Secondary diagnoses:  Patient Active Problem List   Diagnosis Date Noted  . Prostate cancer ([place]) 05/26/2017  . B12 deficiency 04/21/2017  . Vitamin D deficiency disease 03/14/2017  . Elevated PSA 07/18/2016  . Urinary hesitancy 07/18/2016    History and Physical: For full details, please see admission history and physical. Briefly, Donald Mendoza is a 57 y.o. year old patient with history of prostate cancer as well as thrombocytopenia.   Hospital Course: Mr. Corrales was admitted to the hospital for transfusion of 2 units of platelets prior to surgery.  This was well-tolerated.  He was taken to the operating room on 05/28/2017 for robotic prostatectomy with bilateral pelvic lymph node dissection.  He tolerated the procedure well.  He was then transferred to the floor after an uneventful PACU stay.  His hospital course was uncomplicated.  On POD#1 he had met discharge criteria: was eating a regular diet, was up and ambulating independently,  pain was well controlled, JP drain was removed after drain creatinine was proven to be consistent with serum and was ready to for discharge.  Physical Exam  Constitutional: He is oriented to person, place, and time. He appears well-developed.  HENT:  Head: Normocephalic and atraumatic.  Neck: Normal range of motion. Neck supple.  Cardiovascular: Normal rate.  Pulmonary/Chest: Effort normal. No respiratory distress.  Abdominal:  Soft, appropriately tender.  Minimal bruising around midline incision.  Wounds clean dry and intact.  JP with scant serosanguineous fluid.  Genitourinary:  Genitourinary Comments: Foley in place draining clear yellow urine, anchored to the left thigh.  Neurological: He is alert and oriented to person, place, and time.  Skin: Skin is warm and dry.  Psychiatric: He has a normal  mood and affect.  Vitals reviewed.     Laboratory values:  Recent Labs    05/26/17 1454 05/27/17 0627 05/28/17 0356  WBC 5.2 4.9 8.9  HGB 14.0 13.4 12.4*  HCT 41.8 39.9* 37.9*   Recent Labs    05/26/17 1454 05/28/17 0356  NA 139 139  K 3.7 4.4  CL 106 109  CO2 29 25  GLUCOSE 103* 118*  BUN 12 12  CREATININE 0.89 1.06  CALCIUM 9.0 8.7*   No results for input(s): LABPT, INR in the last 72 hours. No results for input(s): LABURIN in the last 72 hours. Results for orders placed or performed during the hospital encounter of 05/26/17  Surgical PCR screen     Status: None   Collection Time: 05/26/17  4:00 PM  Result Value Ref Range Status   MRSA, PCR NEGATIVE NEGATIVE Final   Staphylococcus aureus NEGATIVE NEGATIVE Final    Comment: (NOTE) The Xpert SA Assay (FDA approved for NASAL specimens in patients 59 years of age and older), is one component of a comprehensive surveillance program. It is not intended to diagnose infection nor to guide or monitor treatment. Performed at [facility], [address] [place],  [address]     Disposition: Home  Discharge instruction: The patient was instructed to be ambulatory but told to refrain from heavy lifting, strenuous activity, or driving.   Discharge medications:  Allergies as of 05/28/2017      Reactions   Sulfa Antibiotics Hives      Medication List    TAKE these medications   docusate sodium 100 MG capsule Commonly known as:  COLACE  Take 1 capsule (100 mg total) by mouth 2 (two) times daily.   latanoprost 0.005 % ophthalmic solution Commonly known as:  XALATAN Place 1 drop at bedtime into both eyes.   oxybutynin 5 MG tablet Commonly known as:  DITROPAN Take 1 tablet (5 mg total) by mouth every 8 (eight) hours as needed for bladder spasms.   oxyCODONE-acetaminophen 5-325 MG tablet Commonly known as:  PERCOCET Take 1-2 tablets by mouth every 4 (four) hours as needed for moderate pain or  severe pain.   pravastatin 40 MG tablet Commonly known as:  PRAVACHOL Take 1 tablet (40 mg total) by mouth daily.       Followup:  Follow-up Information    Hollice Espy, MD Follow up in 1 week(s).   Specialty:  Urology Why:  Foley removal Contact information: [place] [address] [phone]

## 2017-05-28 NOTE — Progress Notes (Signed)
Per MD okay for RN to DC iv fluids.  

## 2017-05-30 LAB — SURGICAL PATHOLOGY

## 2017-05-31 ENCOUNTER — Telehealth: Payer: Self-pay

## 2017-05-31 NOTE — Telephone Encounter (Signed)
-----   Message from Hollice Espy, MD sent at 05/30/2017  4:20 PM EST ----- Called patient personally to review pathology.  All questions answered.  He is doing well postoperatively, anxious to have his catheter removed on Monday.

## 2017-06-02 NOTE — Progress Notes (Signed)
Hematology/Oncology  Follow up note [facility] [phone] [phone]   Patient Care Team: Ronnell Freshwater, NP as PCP - General (Family Medicine) Christie Nottingham, PA as Referring Physician (Physician Assistant) Christene Lye, MD (General Surgery)  PURPOSE OF CONSULTATION:  Follow up of management of thrombocytopenia.  HISTORY OF PRESENTING ILLNESS:  Donald Mendoza is a  57 y.o.  male with PMH listed below who was referred to me for evaluation of thrombocytopenia. Patient was recently diagnosed with intermediate risk prostate cancer in the follows up with Dr. Erlene Quan. Initially he was diagnosed with a low-risk prostate cancer Gleason score 3+3 in March 2018 when his PSA was 4. PSA continued to rise to 5.2 and patient had a repeat prostate biopsy revealing Gleason score 4+3. Patient was scheduled for radical prostatectomy however was found to have thrombocytopenia. Referred to [place] to evaluation for the low platelet count. Patient reports that he has been told that his platelet has been low in the past. He never received any treatment.  INTERVAL HISTORY Patient presents to follow up for management of thrombocytopenia. He has had radical prostatectomy and reports feeling well today. No new concerns today. No bleeding events.    Review of Systems  Constitutional: Negative for malaise/fatigue and weight loss.  HENT: Negative for ear pain, hearing loss, nosebleeds and tinnitus.   Eyes: Negative for blurred vision, double vision and discharge.  Respiratory: Negative for cough, hemoptysis and shortness of breath.   Cardiovascular: Negative for chest pain, palpitations and claudication.  Gastrointestinal: Negative for abdominal pain, heartburn and vomiting.  Genitourinary: Negative for dysuria, frequency and urgency.  Musculoskeletal: Negative for back pain, myalgias and neck pain.  Neurological: Negative for dizziness, tingling, tremors and  weakness.  Endo/Heme/Allergies: Negative for environmental allergies. Does not bruise/bleed easily.  Psychiatric/Behavioral: Negative for depression and substance abuse. The patient is not nervous/anxious.     MEDICAL HISTORY:  Past Medical History:  Diagnosis Date  . B12 deficiency 04/21/2017  . Cancer [facility]) 2018   Prostate Cancer  . Diverticulosis   . GERD (gastroesophageal reflux disease)   . Hypercholesteremia     SURGICAL HISTORY: Past Surgical History:  Procedure Laterality Date  . COLONOSCOPY    . HERNIA REPAIR     10 years ago, [place]. umbilical hernia x 2  . PELVIC LYMPH NODE DISSECTION Bilateral 05/27/2017   Procedure: PELVIC LYMPH NODE DISSECTION;  Surgeon: Hollice Espy, MD;  Location: [facility] ORS;  Service: Urology;  Laterality: Bilateral;  . ROBOT ASSISTED LAPAROSCOPIC RADICAL PROSTATECTOMY N/A 05/27/2017   Procedure: ROBOTIC ASSISTED LAPAROSCOPIC RADICAL PROSTATECTOMY;  Surgeon: Hollice Espy, MD;  Location: [facility] ORS;  Service: Urology;  Laterality: N/A;    SOCIAL HISTORY: Social History   Socioeconomic History  . Marital status: Single    Spouse name: Not on file  . Number of children: Not on file  . Years of education: Not on file  . Highest education level: Not on file  Social Needs  . Financial resource strain: Not on file  . Food insecurity - worry: Not on file  . Food insecurity - inability: Not on file  . Transportation needs - medical: Not on file  . Transportation needs - non-medical: Not on file  Occupational History  . Not on file  Tobacco Use  . Smoking status: Former Smoker    Packs/day: 0.50    Years: 34.00    Pack years: 17.00    Types: Cigarettes    Last attempt to  quit: 04/06/2017    Years since quitting: 0.1  . Smokeless tobacco: Never Used  Substance and Sexual Activity  . Alcohol use: Yes    Alcohol/week: 1.2 oz    Types: 2 Cans of beer per week  . Drug use: No  . Sexual activity: Yes  Other Topics Concern  . Not on file    Social History Narrative  . Not on file    FAMILY HISTORY: Family History  Problem Relation Age of Onset  . Diabetes Mother   . Breast cancer Mother   . Breast cancer Sister     ALLERGIES:  is allergic to sulfa antibiotics.  MEDICATIONS:  Current Outpatient Medications  Medication Sig Dispense Refill  . docusate sodium (COLACE) 100 MG capsule Take 1 capsule (100 mg total) by mouth 2 (two) times daily. 60 capsule 0  . latanoprost (XALATAN) 0.005 % ophthalmic solution Place 1 drop at bedtime into both eyes.   0  . oxybutynin (DITROPAN) 5 MG tablet Take 1 tablet (5 mg total) by mouth every 8 (eight) hours as needed for bladder spasms. 30 tablet 0  . oxyCODONE-acetaminophen (PERCOCET) 5-325 MG tablet Take 1-2 tablets by mouth every 4 (four) hours as needed for moderate pain or severe pain. 15 tablet 0  . pravastatin (PRAVACHOL) 40 MG tablet Take 1 tablet (40 mg total) by mouth daily. 30 tablet 6   No current facility-administered medications for this visit.      PHYSICAL EXAMINATION: ECOG PERFORMANCE STATUS: 0 - Asymptomatic Vitals:   06/03/17 1435  BP: 96/62  Pulse: 90  Temp: 97.8 F (36.6 C)   Filed Weights   06/03/17 1435  Weight: 156 lb (70.8 kg)    Physical Exam  Constitutional: He is oriented to person, place, and time and well-developed, well-nourished, and in no distress. No distress.  HENT:  Head: Normocephalic and atraumatic.  Mouth/Throat: Oropharynx is clear and moist.  Eyes: Conjunctivae and EOM are normal. Pupils are equal, round, and reactive to light. Left eye exhibits no discharge.  Neck: Normal range of motion. Neck supple.  Cardiovascular: Normal rate, regular rhythm and normal heart sounds.  No murmur heard. Pulmonary/Chest: Effort normal and breath sounds normal. No respiratory distress. He has no rales.  Abdominal: Soft. Bowel sounds are normal. He exhibits no distension. There is no tenderness.  Musculoskeletal: Normal range of motion. He  exhibits no edema.  Lymphadenopathy:    He has no cervical adenopathy.  Neurological: He is alert and oriented to person, place, and time. No cranial nerve deficit.  Skin: Skin is warm and dry. No rash noted. He is not diaphoretic. No erythema.  Psychiatric: Affect and judgment normal.     LABORATORY DATA:  I have reviewed the data as listed Lab Results  Component Value Date   WBC 8.8 06/03/2017   HGB 13.1 06/03/2017   HCT 39.7 (L) 06/03/2017   MCV 90.7 06/03/2017   PLT 121 (L) 06/03/2017   Recent Labs    04/10/17 1605 05/26/17 1454 05/28/17 0356  NA 142 139 139  K 3.7 3.7 4.4  CL 106 106 109  CO2 27 29 25   GLUCOSE 112* 103* 118*  BUN 13 12 12   CREATININE 0.69 0.89 1.06  CALCIUM 9.3 9.0 8.7*  GFRNONAA >60 >60 >60  GFRAA >60 >60 >60  PROT 7.5  --   --   ALBUMIN 4.3  --   --   AST 23  --   --   ALT 18  --   --  ALKPHOS 115  --   --   BILITOT 0.5  --   --     RADIOGRAPHIC STUDIES: I have personally reviewed the radiological images as listed and agreed with the findings in the report. US Abdomen showed no liver parachyema changes or splenomegaly.    ASSESSMENT & PLAN:  1. Thrombocytopenia ([place])   2. B12 deficiency   3. Prostate cancer ([place])   4. Alcohol use    # patient had platelet transfusion prior to radical prostatectomy. Platelet improved to 1 50,000, post surgery current platelet counts at 121,000.   Counts has improved compared to previous level. Continue monitor. # B12 deficiency, s/p IM vitamin B12, vitamin b12 improved and his fatigue has much improved.  # prostate cancer, status post radical prostatectomy. He T2 N0, favorable intermediate risk. I advised patient continue to follow up with urology, defer urology for checking post-surgical PSA>    All questions were answered. The patient knows to call the clinic with any problems questions or concerns.  Return of visit: 2 months with repeat CBC.  Earlie Server, MD, PhD Hematology Oncology [facility] Pager- [phone] 06/03/2017

## 2017-06-03 ENCOUNTER — Other Ambulatory Visit: Payer: Self-pay

## 2017-06-03 ENCOUNTER — Encounter: Payer: Self-pay | Admitting: Oncology

## 2017-06-03 ENCOUNTER — Inpatient Hospital Stay: Payer: BLUE CROSS/BLUE SHIELD | Attending: Oncology

## 2017-06-03 ENCOUNTER — Inpatient Hospital Stay (HOSPITAL_BASED_OUTPATIENT_CLINIC_OR_DEPARTMENT_OTHER): Payer: BLUE CROSS/BLUE SHIELD | Admitting: Oncology

## 2017-06-03 ENCOUNTER — Ambulatory Visit (INDEPENDENT_AMBULATORY_CARE_PROVIDER_SITE_OTHER): Payer: BLUE CROSS/BLUE SHIELD | Admitting: Family Medicine

## 2017-06-03 VITALS — BP 96/62 | HR 90 | Temp 97.8°F | Wt 156.0 lb

## 2017-06-03 DIAGNOSIS — Z7289 Other problems related to lifestyle: Secondary | ICD-10-CM | POA: Diagnosis not present

## 2017-06-03 DIAGNOSIS — E538 Deficiency of other specified B group vitamins: Secondary | ICD-10-CM | POA: Insufficient documentation

## 2017-06-03 DIAGNOSIS — D696 Thrombocytopenia, unspecified: Secondary | ICD-10-CM

## 2017-06-03 DIAGNOSIS — Z9079 Acquired absence of other genital organ(s): Secondary | ICD-10-CM | POA: Diagnosis not present

## 2017-06-03 DIAGNOSIS — C61 Malignant neoplasm of prostate: Secondary | ICD-10-CM

## 2017-06-03 DIAGNOSIS — Z87891 Personal history of nicotine dependence: Secondary | ICD-10-CM | POA: Diagnosis not present

## 2017-06-03 DIAGNOSIS — Z789 Other specified health status: Secondary | ICD-10-CM

## 2017-06-03 DIAGNOSIS — F1090 Alcohol use, unspecified, uncomplicated: Secondary | ICD-10-CM

## 2017-06-03 LAB — CBC WITH DIFFERENTIAL/PLATELET
BASOS PCT: 1 %
Basophils Absolute: 0.1 10*3/uL (ref 0–0.1)
EOS ABS: 0.1 10*3/uL (ref 0–0.7)
EOS PCT: 1 %
HCT: 39.7 % — ABNORMAL LOW (ref 40.0–52.0)
Hemoglobin: 13.1 g/dL (ref 13.0–18.0)
Lymphocytes Relative: 20 %
Lymphs Abs: 1.8 10*3/uL (ref 1.0–3.6)
MCH: 30 pg (ref 26.0–34.0)
MCHC: 33.1 g/dL (ref 32.0–36.0)
MCV: 90.7 fL (ref 80.0–100.0)
MONOS PCT: 7 %
Monocytes Absolute: 0.6 10*3/uL (ref 0.2–1.0)
Neutro Abs: 6.3 10*3/uL (ref 1.4–6.5)
Neutrophils Relative %: 71 %
PLATELETS: 121 10*3/uL — AB (ref 150–400)
RBC: 4.38 MIL/uL — AB (ref 4.40–5.90)
RDW: 14.1 % (ref 11.5–14.5)
WBC: 8.8 10*3/uL (ref 3.8–10.6)

## 2017-06-03 LAB — BLADDER SCAN AMB NON-IMAGING: SCAN RESULT: 15

## 2017-06-03 NOTE — Progress Notes (Signed)
Catheter Removal  Patient is present today for a catheter removal.  65ml of water was drained from the balloon. A 16FR foley cath was removed from the bladder no complications were noted . Patient tolerated well.  Preformed by: Elberta Leatherwood, CMA  Follow up/ Additional notes: Return to office for PVR at 3:00pm   Bladder Scan:15 Patient can void:  Performed By: Toniann Fail, LPN

## 2017-06-03 NOTE — Progress Notes (Signed)
Patient here today for follow up.   

## 2017-06-25 ENCOUNTER — Encounter: Payer: Self-pay | Admitting: Urology

## 2017-06-25 ENCOUNTER — Ambulatory Visit (INDEPENDENT_AMBULATORY_CARE_PROVIDER_SITE_OTHER): Payer: BLUE CROSS/BLUE SHIELD | Admitting: Urology

## 2017-06-25 VITALS — BP 117/74 | HR 77 | Ht 67.0 in | Wt 152.0 lb

## 2017-06-25 DIAGNOSIS — N5232 Erectile dysfunction following radical cystectomy: Secondary | ICD-10-CM

## 2017-06-25 DIAGNOSIS — C61 Malignant neoplasm of prostate: Secondary | ICD-10-CM | POA: Diagnosis not present

## 2017-06-25 DIAGNOSIS — N393 Stress incontinence (female) (male): Secondary | ICD-10-CM

## 2017-06-25 MED ORDER — SILDENAFIL CITRATE 20 MG PO TABS: 20.0000 mg | ORAL_TABLET | ORAL | 11 refills | Status: DC | PRN

## 2017-06-25 NOTE — Progress Notes (Signed)
06/25/2017 5:31 PM   Donald Mendoza 1960-08-30 [id number]  Referring provider: Ronnell Freshwater, NP [place], [place] [address]  Chief Complaint  Patient presents with  . Post-op Follow-up    4wk    HPI: 57 year old male status post radical robotic prostatectomy on 05/27/2017.  He underwent a full nerve sparing procedure on the left and partial nerve sparing on the right.  Lymph node dissection was also performed.  Surgical pathology consistent with Gleason 4+3 prostate cancer, large volume of Gleason 4 component greater than 60% of the right nodule.  No extracapsular extension, seminal vesicle invasion, bladder neck invasion.  Margins were negative.  Lymph nodes negative but the left-sided lymph node dissection was somewhat limited due to presence of mesh.  Preop PSA 5.2 in 02/2007.  He continues to leak but this is improving slowly.  Over the last week, he has made real progress.  He started back doing his physical therapy exercises.  He is not had any erection since surgery.  He has not attempted this at this point.  Incisions are healing well.  He has no postoperative complaints.  He is anxious to return to work.  PMH: Past Medical History:  Diagnosis Date  . B12 deficiency 04/21/2017  . Cancer [facility]) 2018   Prostate Cancer  . Diverticulosis   . GERD (gastroesophageal reflux disease)   . Hypercholesteremia     Surgical History: Past Surgical History:  Procedure Laterality Date  . COLONOSCOPY    . HERNIA REPAIR     10 years ago, [place]. umbilical hernia x 2  . PELVIC LYMPH NODE DISSECTION Bilateral 05/27/2017   Procedure: PELVIC LYMPH NODE DISSECTION;  Surgeon: Hollice Espy, MD;  Location: [facility] ORS;  Service: Urology;  Laterality: Bilateral;  . ROBOT ASSISTED LAPAROSCOPIC RADICAL PROSTATECTOMY N/A 05/27/2017   Procedure: ROBOTIC ASSISTED LAPAROSCOPIC RADICAL PROSTATECTOMY;  Surgeon: Hollice Espy, MD;  Location: [facility] ORS;  Service: Urology;  Laterality:  N/A;    Home Medications:  Allergies as of 06/25/2017      Reactions   Sulfa Antibiotics Hives      Medication List        Accurate as of 06/25/17 11:59 PM. Always use your most recent med list.          latanoprost 0.005 % ophthalmic solution Commonly known as:  XALATAN Place 1 drop at bedtime into both eyes.   pravastatin 40 MG tablet Commonly known as:  PRAVACHOL Take 1 tablet (40 mg total) by mouth daily.   sildenafil 20 MG tablet Commonly known as:  REVATIO Take 1 tablet (20 mg total) by mouth as needed. Take 1-5 tabs as needed prior to intercourse       Allergies:  Allergies  Allergen Reactions  . Sulfa Antibiotics Hives    Family History: Family History  Problem Relation Age of Onset  . Diabetes Mother   . Breast cancer Mother   . Breast cancer Sister     Social History:  reports that he quit smoking about 2 months ago. His smoking use included cigarettes. He has a 17.00 pack-year smoking history. he has never used smokeless tobacco. He reports that he drinks about 1.2 oz of alcohol per week. He reports that he does not use drugs.  ROS: UROLOGY Frequent Urination?: No Hard to postpone urination?: No Burning/pain with urination?: No Get up at night to urinate?: Yes Leakage of urine?: Yes Urine stream starts and stops?: Yes Trouble starting stream?: No Do you have to  strain to urinate?: No Blood in urine?: No Urinary tract infection?: No Sexually transmitted disease?: No Injury to kidneys or bladder?: No Painful intercourse?: No Weak stream?: No Erection problems?: No Penile pain?: No  Gastrointestinal Nausea?: No Vomiting?: No Indigestion/heartburn?: No Diarrhea?: No Constipation?: No  Constitutional Fever: No Night sweats?: No Weight loss?: No Fatigue?: No  Skin Skin rash/lesions?: No Itching?: No  Eyes Blurred vision?: No Double vision?: No  Ears/Nose/Throat Sore throat?: No Sinus problems?:  No  Hematologic/Lymphatic Swollen glands?: No Easy bruising?: No  Cardiovascular Leg swelling?: No Chest pain?: No  Respiratory Cough?: No Shortness of breath?: No  Endocrine Excessive thirst?: No  Musculoskeletal Back pain?: No Joint pain?: No  Neurological Headaches?: No Dizziness?: No  Psychologic Depression?: No Anxiety?: No  Physical Exam: BP 117/74   Pulse 77   Ht 5\' 7"  (1.702 m)   Wt 152 lb (68.9 kg)   BMI 23.81 kg/m   Constitutional:  Alert and oriented, No acute distress. HEENT: [place] AT, moist mucus membranes.  Trachea midline, no masses. Cardiovascular: No clubbing, cyanosis, or edema. Respiratory: Normal respiratory effort, no increased work of breathing. GI: Abdomen is soft, nontender, nondistended, no abdominal masses.  Incisions well-healed. Skin: No rashes, bruises or suspicious lesions. Neurologic: Grossly intact, no focal deficits, moving all 4 extremities. Psychiatric: Normal mood and affect.  Laboratory Data: Lab Results  Component Value Date   WBC 8.8 06/03/2017   HGB 13.1 06/03/2017   HCT 39.7 (L) 06/03/2017   MCV 90.7 06/03/2017   PLT 121 (L) 06/03/2017    Lab Results  Component Value Date   CREATININE 1.06 05/28/2017    Lab Results  Component Value Date   PSA1 <0.1 06/25/2017   PSA1 5.2 (H) 02/22/2017   Urinalysis N/a  Pertinent Imaging: N/a  Assessment & Plan:    1. Prostate cancer [facility]) S/p RALP with BPLND 05/2017 Gleason 4+3, pT2 N0 PSA today and plan for repeat q6 months if PSA undetectable - PSA  2. Erectile dysfunction after radical cystectomy Discussed aggressive penile rehab today at length Sildenafil prescribed, advised to take tablet 1 hour prior to sexual activity ideally on empty stomach and titrate up to 5 pills as needed Penile stretching  3. Stress incontinence, male Improving, patient encouraged Continue Kegels  Return in about 6 months (around 12/23/2017) for PSA.  Hollice Espy,  MD  [facility] [address] [place], [place] [address] [phone]

## 2017-06-26 ENCOUNTER — Encounter: Payer: Self-pay | Admitting: Family Medicine

## 2017-06-26 LAB — PSA: Prostate Specific Ag, Serum: <0.1 ng/mL (ref 0.0–4.0)

## 2017-07-09 ENCOUNTER — Telehealth: Payer: Self-pay

## 2017-07-09 NOTE — Telephone Encounter (Signed)
Pt called stating he has developed a sinus cold and therefore he has been coughing more. Pt stated that since he has been coughing he is having an increase in leakage. Inquired about pt performing pelvic floor exercises. Pt stated that he stopped due to not having any leakage. Reinforced with pt to continue the exercises to help with leakage even with a cold. Pt stated that the cialis given is not helping. Reinforced with pt that he may not take affect immediately. Pt requested an appt with Dr. Erlene Quan and became distraught. Appt was made with Dr. Erlene Quan.

## 2017-07-10 ENCOUNTER — Ambulatory Visit (INDEPENDENT_AMBULATORY_CARE_PROVIDER_SITE_OTHER): Payer: BLUE CROSS/BLUE SHIELD | Admitting: Urology

## 2017-07-10 ENCOUNTER — Encounter: Payer: Self-pay | Admitting: Urology

## 2017-07-10 VITALS — BP 100/63 | HR 101 | Ht 67.0 in | Wt 153.0 lb

## 2017-07-10 DIAGNOSIS — N393 Stress incontinence (female) (male): Secondary | ICD-10-CM

## 2017-07-10 DIAGNOSIS — C61 Malignant neoplasm of prostate: Secondary | ICD-10-CM

## 2017-07-10 DIAGNOSIS — N5232 Erectile dysfunction following radical cystectomy: Secondary | ICD-10-CM

## 2017-07-12 ENCOUNTER — Ambulatory Visit: Payer: Self-pay | Admitting: Nurse Practitioner

## 2017-07-15 NOTE — Progress Notes (Signed)
07/10/2017 3:39 PM   Donald Mendoza 1960/12/24 [id number]  Referring provider: Ronnell Freshwater, NP [address] [place] [address]  Chief Complaint  Patient presents with  . Prostate Cancer    HPI: 57 year old male status post radical robotic prostatectomy on 05/27/2017 who returns today sooner than for multiple concerns.  He reports that over the past week, he has had a cold with lots of sneezing and coughing.  Because of this, his incontinence has increased dramatically.  It was improving on a nearly daily basis but he has had a major setback.  He is now completely saturated in his depends.  He also reports having irritation of his groin perineal and scrotal area due to being chronically wet.  He is psychologically disturbed by this.  Additionally, he is concerned today about his erections.  He was prescribed generic Viagra and try to now twice.  On the first occasion, he took 3 tablets and got greater than 50% erection which was satisfying to him.  More recently, he tried 4 tablets with absolutely no response.  He is extremely worried about this.  He did not take this on an empty stomach.    He is otherwise doing well.  PSA is undetectable.  Incisions are healing well.  Pathology per previous note.   PMH: Past Medical History:  Diagnosis Date  . B12 deficiency 04/21/2017  . Cancer [facility]) 2018   Prostate Cancer  . Diverticulosis   . GERD (gastroesophageal reflux disease)   . Hypercholesteremia     Surgical History: Past Surgical History:  Procedure Laterality Date  . COLONOSCOPY    . HERNIA REPAIR     10 years ago, [place]. umbilical hernia x 2  . PELVIC LYMPH NODE DISSECTION Bilateral 05/27/2017   Procedure: PELVIC LYMPH NODE DISSECTION;  Surgeon: Hollice Espy, MD;  Location: [facility] ORS;  Service: Urology;  Laterality: Bilateral;  . ROBOT ASSISTED LAPAROSCOPIC RADICAL PROSTATECTOMY N/A 05/27/2017   Procedure: ROBOTIC ASSISTED LAPAROSCOPIC RADICAL PROSTATECTOMY;  Surgeon:  Hollice Espy, MD;  Location: [facility] ORS;  Service: Urology;  Laterality: N/A;    Home Medications:  Allergies as of 07/10/2017      Reactions   Sulfa Antibiotics Hives      Medication List        Accurate as of 07/10/17 11:59 PM. Always use your most recent med list.          latanoprost 0.005 % ophthalmic solution Commonly known as:  XALATAN Place 1 drop at bedtime into both eyes.   pravastatin 40 MG tablet Commonly known as:  PRAVACHOL Take 1 tablet (40 mg total) by mouth daily.   sildenafil 20 MG tablet Commonly known as:  REVATIO Take 1 tablet (20 mg total) by mouth as needed. Take 1-5 tabs as needed prior to intercourse       Allergies:  Allergies  Allergen Reactions  . Sulfa Antibiotics Hives    Family History: Family History  Problem Relation Age of Onset  . Diabetes Mother   . Breast cancer Mother   . Breast cancer Sister     Social History:  reports that he quit smoking about 3 months ago. His smoking use included cigarettes. He has a 17.00 pack-year smoking history. he has never used smokeless tobacco. He reports that he drinks about 1.2 oz of alcohol per week. He reports that he does not use drugs.  ROS: UROLOGY Frequent Urination?: Yes Hard to postpone urination?: No Burning/pain with urination?: No Get up at night  to urinate?: No Leakage of urine?: Yes Urine stream starts and stops?: No Trouble starting stream?: No Do you have to strain to urinate?: No Blood in urine?: No Urinary tract infection?: No Sexually transmitted disease?: No Injury to kidneys or bladder?: No Painful intercourse?: No Weak stream?: No Erection problems?: Yes Penile pain?: No  Gastrointestinal Nausea?: No Vomiting?: No Indigestion/heartburn?: No Diarrhea?: No Constipation?: No  Constitutional Fever: No Night sweats?: No Weight loss?: No Fatigue?: No  Skin Skin rash/lesions?: No Itching?: No  Eyes Blurred vision?: No Double vision?:  No  Ears/Nose/Throat Sore throat?: Yes Sinus problems?: Yes  Hematologic/Lymphatic Swollen glands?: No Easy bruising?: No  Cardiovascular Leg swelling?: No Chest pain?: No  Respiratory Cough?: Yes Shortness of breath?: No  Endocrine Excessive thirst?: No  Musculoskeletal Back pain?: No Joint pain?: No  Neurological Headaches?: No Dizziness?: No  Psychologic Depression?: No Anxiety?: No  Physical Exam: BP 100/63   Pulse (!) 101   Ht 5\' 7"  (1.702 m)   Wt 153 lb (69.4 kg)   BMI 23.96 kg/m   Constitutional:  Alert and oriented, No acute distress. HEENT: [place] AT, moist mucus membranes.  Trachea midline, no masses. Cardiovascular: No clubbing, cyanosis, or edema. Respiratory: Normal respiratory effort, no increased work of breathing. Neurologic: Grossly intact, no focal deficits, moving all 4 extremities. Psychiatric: Normal mood and affect.  Laboratory Data: Lab Results  Component Value Date   WBC 8.8 06/03/2017   HGB 13.1 06/03/2017   HCT 39.7 (L) 06/03/2017   MCV 90.7 06/03/2017   PLT 121 (L) 06/03/2017    Lab Results  Component Value Date   CREATININE 1.06 05/28/2017    Lab Results  Component Value Date   PSA1 <0.1 06/25/2017   PSA1 5.2 (H) 02/22/2017   Urinalysis N/a  Pertinent Imaging: N/a  Assessment & Plan:    1. Prostate cancer [facility]) NED Repeat PSA is scheduled  2. Erectile dysfunction after radical cystectomy Patient was reassured Advised to increase to 100 mg and discussed optimal administration If he fails to respond to this, he was offered intracorporeal injections This was discussed at length today including the process to pick up these medications, test dosing, and efficacy He was also reassured that it is quite early in his recovery and that it is common for PD 5 inhibitors to fail It is an excellent prognostic factor that he has already had some response with partial erection this early post op  3. Stress incontinence,  male Reassured Continue pelvic floor exercises as previously recommended  Follow-up as scheduled  Hollice Espy, MD  [facility] [address], [place], [place] [address] [phone]

## 2017-07-17 ENCOUNTER — Other Ambulatory Visit: Payer: Self-pay

## 2017-07-17 MED ORDER — PRAVASTATIN SODIUM 40 MG PO TABS: 40.0000 mg | ORAL_TABLET | Freq: Every day | ORAL | 6 refills | Status: DC

## 2017-08-01 ENCOUNTER — Inpatient Hospital Stay (HOSPITAL_BASED_OUTPATIENT_CLINIC_OR_DEPARTMENT_OTHER): Payer: BLUE CROSS/BLUE SHIELD | Admitting: Oncology

## 2017-08-01 ENCOUNTER — Inpatient Hospital Stay: Payer: BLUE CROSS/BLUE SHIELD | Attending: Oncology

## 2017-08-01 ENCOUNTER — Other Ambulatory Visit: Payer: Self-pay

## 2017-08-01 ENCOUNTER — Encounter: Payer: Self-pay | Admitting: Oncology

## 2017-08-01 VITALS — BP 107/71 | HR 87 | Temp 98.0°F | Resp 12 | Ht 67.0 in | Wt 153.0 lb

## 2017-08-01 DIAGNOSIS — D696 Thrombocytopenia, unspecified: Secondary | ICD-10-CM | POA: Insufficient documentation

## 2017-08-01 DIAGNOSIS — Z789 Other specified health status: Secondary | ICD-10-CM

## 2017-08-01 DIAGNOSIS — C61 Malignant neoplasm of prostate: Secondary | ICD-10-CM | POA: Diagnosis not present

## 2017-08-01 DIAGNOSIS — Z7289 Other problems related to lifestyle: Secondary | ICD-10-CM

## 2017-08-01 LAB — CBC WITH DIFFERENTIAL/PLATELET
BASOS ABS: 0 10*3/uL (ref 0–0.1)
BASOS PCT: 1 %
Eosinophils Absolute: 0.1 10*3/uL (ref 0–0.7)
Eosinophils Relative: 2 %
HCT: 39.3 % — ABNORMAL LOW (ref 40.0–52.0)
HEMOGLOBIN: 13.2 g/dL (ref 13.0–18.0)
LYMPHS PCT: 31 %
Lymphs Abs: 2.2 10*3/uL (ref 1.0–3.6)
MCH: 29.8 pg (ref 26.0–34.0)
MCHC: 33.6 g/dL (ref 32.0–36.0)
MCV: 88.7 fL (ref 80.0–100.0)
MONO ABS: 0.4 10*3/uL (ref 0.2–1.0)
MONOS PCT: 6 %
NEUTROS ABS: 4.4 10*3/uL (ref 1.4–6.5)
NEUTROS PCT: 60 %
Platelets: 93 10*3/uL — ABNORMAL LOW (ref 150–400)
RBC: 4.44 MIL/uL (ref 4.40–5.90)
RDW: 14 % (ref 11.5–14.5)
WBC: 7.2 10*3/uL (ref 3.8–10.6)

## 2017-08-01 NOTE — Progress Notes (Signed)
Hematology/Oncology  Follow up note [facility] Telephone:([phone] Fax:([phone]) [phone]   Patient Care Team: Ronnell Freshwater, NP as PCP - General (Family Medicine) Christie Nottingham, PA as Referring Physician (Physician Assistant) Christene Lye, MD (General Surgery)  PURPOSE OF CONSULTATION:  Follow up of management of thrombocytopenia.  HISTORY OF PRESENTING ILLNESS:  Donald Mendoza is a  57 y.o.  male with PMH listed below who was referred to me for evaluation of thrombocytopenia. Patient was recently diagnosed with intermediate risk prostate cancer in the follows up with Dr. Erlene Quan. Initially he was diagnosed with a low-risk prostate cancer Gleason score 3+3 in March 2018 when his PSA was 4. PSA continued to rise to 5.2 and patient had a repeat prostate biopsy revealing Gleason score 4+3. Patient was scheduled for radical prostatectomy however was found to have thrombocytopenia. Referred to [place] to evaluation for the low platelet count. Patient reports that he has been told that his platelet has been low in the past. He never received any treatment.  # S/p RALP with BPLND 05/27/2017, pathology showed Gleason 4+3, pT2 N0, NEGATIVE FOR EXTRAPROSTATIC EXTENSION, SEMINAL VESICLE INVASION, AND BLADDER NECK INVASION. - MARGINS NEGATIVE FOR CARCINOMA.    INTERVAL HISTORY Patient presents to follow up for management of thrombocytopenia. Feels well. Reports having ED and is taking sildenafil . No new concerns today. No bleeding events.    Review of Systems  Constitutional: Negative for chills, fever, malaise/fatigue and weight loss.  HENT: Negative for ear pain, hearing loss, nosebleeds and tinnitus.   Eyes: Negative for blurred vision, double vision and discharge.  Respiratory: Negative for cough, hemoptysis and shortness of breath.   Cardiovascular: Negative for chest pain, palpitations and claudication.  Gastrointestinal: Negative for abdominal pain,  heartburn and vomiting.  Genitourinary: Negative for dysuria, frequency and urgency.       Leaky, erection dysfunction.   Musculoskeletal: Negative for back pain, myalgias and neck pain.  Skin: Negative for rash.  Neurological: Negative for dizziness, tingling, tremors, speech change and weakness.  Endo/Heme/Allergies: Negative for environmental allergies. Does not bruise/bleed easily.  Psychiatric/Behavioral: Negative for depression and substance abuse. The patient is not nervous/anxious.     MEDICAL HISTORY:  Past Medical History:  Diagnosis Date  . B12 deficiency 04/21/2017  . Cancer [facility]) 2018   Prostate Cancer  . Diverticulosis   . GERD (gastroesophageal reflux disease)   . Hypercholesteremia     SURGICAL HISTORY: Past Surgical History:  Procedure Laterality Date  . COLONOSCOPY    . HERNIA REPAIR     10 years ago, [place]. umbilical hernia x 2  . PELVIC LYMPH NODE DISSECTION Bilateral 05/27/2017   Procedure: PELVIC LYMPH NODE DISSECTION;  Surgeon: Hollice Espy, MD;  Location: [facility] ORS;  Service: Urology;  Laterality: Bilateral;  . ROBOT ASSISTED LAPAROSCOPIC RADICAL PROSTATECTOMY N/A 05/27/2017   Procedure: ROBOTIC ASSISTED LAPAROSCOPIC RADICAL PROSTATECTOMY;  Surgeon: Hollice Espy, MD;  Location: [facility] ORS;  Service: Urology;  Laterality: N/A;    SOCIAL HISTORY: Social History   Socioeconomic History  . Marital status: Single    Spouse name: Not on file  . Number of children: Not on file  . Years of education: Not on file  . Highest education level: Not on file  Social Needs  . Financial resource strain: Not on file  . Food insecurity - worry: Not on file  . Food insecurity - inability: Not on file  . Transportation needs - medical: Not on file  . Transportation  needs - non-medical: Not on file  Occupational History  . Not on file  Tobacco Use  . Smoking status: Former Smoker    Packs/day: 0.50    Years: 34.00    Pack years: 17.00    Types: Cigarettes     Last attempt to quit: 04/06/2017    Years since quitting: 0.3  . Smokeless tobacco: Never Used  Substance and Sexual Activity  . Alcohol use: Yes    Alcohol/week: 1.2 oz    Types: 2 Cans of beer per week  . Drug use: No  . Sexual activity: Yes  Other Topics Concern  . Not on file  Social History Narrative  . Not on file    FAMILY HISTORY: Family History  Problem Relation Age of Onset  . Diabetes Mother   . Breast cancer Mother   . Breast cancer Sister     ALLERGIES:  is allergic to sulfa antibiotics.  MEDICATIONS:  Current Outpatient Medications  Medication Sig Dispense Refill  . latanoprost (XALATAN) 0.005 % ophthalmic solution Place 1 drop at bedtime into both eyes.   0  . pravastatin (PRAVACHOL) 40 MG tablet Take 1 tablet (40 mg total) by mouth daily. 30 tablet 6  . sildenafil (REVATIO) 20 MG tablet Take 1 tablet (20 mg total) by mouth as needed. Take 1-5 tabs as needed prior to intercourse 30 tablet 11   No current facility-administered medications for this visit.      PHYSICAL EXAMINATION: ECOG PERFORMANCE STATUS: 0 - Asymptomatic Vitals:   08/01/17 1512 08/01/17 1517  BP:  107/71  Pulse:  87  Resp: 12   Temp:  98 F (36.7 C)   There were no vitals filed for this visit.  Physical Exam  Constitutional: He is oriented to person, place, and time and well-developed, well-nourished, and in no distress. No distress.  HENT:  Head: Normocephalic and atraumatic.  Mouth/Throat: Oropharynx is clear and moist. No oropharyngeal exudate.  Eyes: Conjunctivae and EOM are normal. Pupils are equal, round, and reactive to light. Left eye exhibits no discharge. No scleral icterus.  Neck: Normal range of motion. Neck supple. No JVD present.  Cardiovascular: Normal rate, regular rhythm and normal heart sounds.  No murmur heard. Pulmonary/Chest: Effort normal and breath sounds normal. No respiratory distress. He has no wheezes. He has no rales.  Abdominal: Soft. Bowel sounds  are normal. He exhibits no distension. There is no tenderness. There is no guarding.  Musculoskeletal: Normal range of motion. He exhibits no edema.  Lymphadenopathy:    He has no cervical adenopathy.  Neurological: He is alert and oriented to person, place, and time. No cranial nerve deficit. Coordination normal.  Skin: Skin is warm and dry. No rash noted. He is not diaphoretic. No erythema.  Psychiatric: Memory, affect and judgment normal.     LABORATORY DATA:  I have reviewed the data as listed Lab Results  Component Value Date   WBC 8.8 06/03/2017   HGB 13.1 06/03/2017   HCT 39.7 (L) 06/03/2017   MCV 90.7 06/03/2017   PLT 121 (L) 06/03/2017   Recent Labs    04/10/17 1605 05/26/17 1454 05/28/17 0356  NA 142 139 139  K 3.7 3.7 4.4  CL 106 106 109  CO2 27 29 25   GLUCOSE 112* 103* 118*  BUN 13 12 12   CREATININE 0.69 0.89 1.06  CALCIUM 9.3 9.0 8.7*  GFRNONAA >60 >60 >60  GFRAA >60 >60 >60  PROT 7.5  --   --  ALBUMIN 4.3  --   --   AST 23  --   --   ALT 18  --   --   ALKPHOS 115  --   --   BILITOT 0.5  --   --     RADIOGRAPHIC STUDIES: I have personally reviewed the radiological images as listed and agreed with the findings in the report. US Abdomen showed no liver parachyema changes or splenomegaly.    ASSESSMENT & PLAN:  1. Thrombocytopenia ([place])   2. Prostate cancer ([place])   3. Alcohol use    # Prostate cancer pT2N0, PSA <0.1, no adverse features, , no adjuvant treatment needed.   # Chronic thrombocytopenia: levels fluctuate, will recheck B12.  Previous work up including flowcytometry, hepatitis, HIV, folate, normal blood smear. B12 was low and was replaced.  Advise patient to stop alchol use and will recheck in 3 months. If continue to drop, will discuss bone marrow evaluation.    All questions were answered. The patient knows to call the clinic with any problems questions or concerns.  Return of visit: 3 months with repeat CBC.  Earlie Server, MD,  PhD Hematology Oncology [facility] Pager- [phone] 08/01/2017

## 2017-08-01 NOTE — Progress Notes (Signed)
Patient here for follow up, He is concerned about his inability to gain weght.

## 2017-08-09 ENCOUNTER — Other Ambulatory Visit: Payer: Self-pay | Admitting: Nurse Practitioner

## 2017-08-09 DIAGNOSIS — D696 Thrombocytopenia, unspecified: Secondary | ICD-10-CM | POA: Diagnosis not present

## 2017-08-09 DIAGNOSIS — E1165 Type 2 diabetes mellitus with hyperglycemia: Secondary | ICD-10-CM | POA: Diagnosis not present

## 2017-08-09 DIAGNOSIS — E782 Mixed hyperlipidemia: Secondary | ICD-10-CM | POA: Diagnosis not present

## 2017-08-10 LAB — COMPREHENSIVE METABOLIC PANEL
ALK PHOS: 124 IU/L — AB (ref 39–117)
ALT: 12 IU/L (ref 0–44)
AST: 14 IU/L (ref 0–40)
Albumin/Globulin Ratio: 2 (ref 1.2–2.2)
Albumin: 4.1 g/dL (ref 3.5–5.5)
BUN/Creatinine Ratio: 11 (ref 9–20)
BUN: 9 mg/dL (ref 6–24)
Bilirubin Total: <0.2 mg/dL (ref 0.0–1.2)
CHLORIDE: 107 mmol/L — AB (ref 96–106)
CO2: 24 mmol/L (ref 20–29)
CREATININE: 0.84 mg/dL (ref 0.76–1.27)
Calcium: 9.3 mg/dL (ref 8.7–10.2)
GFR calc Af Amer: 115 mL/min/{1.73_m2} (ref >59)
GFR calc non Af Amer: 99 mL/min/{1.73_m2} (ref >59)
GLOBULIN, TOTAL: 2.1 g/dL (ref 1.5–4.5)
GLUCOSE: 94 mg/dL (ref 65–99)
Potassium: 4.3 mmol/L (ref 3.5–5.2)
SODIUM: 144 mmol/L (ref 134–144)
Total Protein: 6.2 g/dL (ref 6.0–8.5)

## 2017-08-10 LAB — LIPID PANEL W/O CHOL/HDL RATIO
Cholesterol, Total: 93 mg/dL — ABNORMAL LOW (ref 100–199)
HDL: 26 mg/dL — AB (ref >39)
LDL Calculated: 58 mg/dL (ref 0–99)
TRIGLYCERIDES: 47 mg/dL (ref 0–149)
VLDL Cholesterol Cal: 9 mg/dL (ref 5–40)

## 2017-08-10 LAB — CBC
HEMATOCRIT: 41.2 % (ref 37.5–51.0)
Hemoglobin: 14 g/dL (ref 13.0–17.7)
MCH: 29.8 pg (ref 26.6–33.0)
MCHC: 34 g/dL (ref 31.5–35.7)
MCV: 88 fL (ref 79–97)
PLATELETS: 87 10*3/uL — AB (ref 150–379)
RBC: 4.7 x10E6/uL (ref 4.14–5.80)
RDW: 13.9 % (ref 12.3–15.4)
WBC: 6.9 10*3/uL (ref 3.4–10.8)

## 2017-08-10 LAB — MICROALBUMIN / CREATININE URINE RATIO
Creatinine, Urine: 214.8 mg/dL
Microalb/Creat Ratio: 22.5 mg/g creat (ref 0.0–30.0)
Microalbumin, Urine: 48.4 ug/mL

## 2017-08-10 LAB — T4, FREE: Free T4: 0.91 ng/dL (ref 0.82–1.77)

## 2017-08-10 LAB — TSH: TSH: 2.47 u[IU]/mL (ref 0.450–4.500)

## 2017-08-12 ENCOUNTER — Encounter: Payer: Self-pay | Admitting: Nurse Practitioner

## 2017-08-12 ENCOUNTER — Ambulatory Visit: Payer: BLUE CROSS/BLUE SHIELD | Admitting: Nurse Practitioner

## 2017-08-12 VITALS — BP 111/77 | HR 81 | Resp 16 | Ht 67.0 in | Wt 158.2 lb

## 2017-08-12 DIAGNOSIS — C61 Malignant neoplasm of prostate: Secondary | ICD-10-CM

## 2017-08-12 DIAGNOSIS — E119 Type 2 diabetes mellitus without complications: Secondary | ICD-10-CM | POA: Diagnosis not present

## 2017-08-12 DIAGNOSIS — Z0001 Encounter for general adult medical examination with abnormal findings: Secondary | ICD-10-CM

## 2017-08-12 DIAGNOSIS — R300 Dysuria: Secondary | ICD-10-CM

## 2017-08-12 DIAGNOSIS — E7800 Pure hypercholesterolemia, unspecified: Secondary | ICD-10-CM | POA: Diagnosis not present

## 2017-08-12 LAB — POCT GLYCOSYLATED HEMOGLOBIN (HGB A1C): Hemoglobin A1C: 5.8

## 2017-08-12 NOTE — Progress Notes (Signed)
[facility] [place], [place] [address]  Internal MEDICINE  Office Visit Note  Patient Name: Donald Mendoza  [id number]  [id number]  Date of Service: 09/02/2017  Chief Complaint  Patient presents with  . Annual Exam     The patient is here for health maintenance exam. He recently had TURP due to prostate cancer. Is having some urine leakage, but is otherwise doing well. Was concerned because he had lost weight, but since his visit at cancer center 2 weeks ago, he has had a 5 pound weight gain.  He states that he started smoking a little more since his surgery and wants to quit completely. When asked, he states that a pack of cigarettes lasts about a week.  Patient was given a lab slip last week. Had his labs done on Friday afternoon. Results are not yet available.   Pt is here for routine health maintenance examination  Current Medication: Outpatient Encounter Medications as of 08/12/2017  Medication Sig Note  . latanoprost (XALATAN) 0.005 % ophthalmic solution Place 1 drop at bedtime into both eyes.  02/27/2016: Received from: External Pharmacy  . pravastatin (PRAVACHOL) 40 MG tablet Take 1 tablet (40 mg total) by mouth daily.   . sildenafil (REVATIO) 20 MG tablet Take 1 tablet (20 mg total) by mouth as needed. Take 1-5 tabs as needed prior to intercourse (Patient not taking: Reported on 08/12/2017)    No facility-administered encounter medications on file as of 08/12/2017.     Surgical History: Past Surgical History:  Procedure Laterality Date  . COLONOSCOPY    . HERNIA REPAIR     10 years ago, [place]. umbilical hernia x 2  . PELVIC LYMPH NODE DISSECTION Bilateral 05/27/2017   Procedure: PELVIC LYMPH NODE DISSECTION;  Surgeon: Hollice Espy, MD;  Location: [facility] ORS;  Service: Urology;  Laterality: Bilateral;  . ROBOT ASSISTED LAPAROSCOPIC RADICAL PROSTATECTOMY N/A 05/27/2017   Procedure: ROBOTIC ASSISTED LAPAROSCOPIC RADICAL PROSTATECTOMY;  Surgeon:  Hollice Espy, MD;  Location: [facility] ORS;  Service: Urology;  Laterality: N/A;    Medical History: Past Medical History:  Diagnosis Date  . B12 deficiency 04/21/2017  . Cancer [facility]) 2018   Prostate Cancer  . Diverticulosis   . GERD (gastroesophageal reflux disease)   . Hypercholesteremia     Family History: Family History  Problem Relation Age of Onset  . Diabetes Mother   . Breast cancer Mother   . Arthritis Father   . Breast cancer Sister       Review of Systems  Constitutional: Negative for activity change, appetite change, fatigue and fever.  HENT: Negative for congestion, postnasal drip, rhinorrhea and sinus pain.   Eyes: Negative.   Respiratory: Negative for shortness of breath and wheezing.   Cardiovascular: Negative for chest pain and palpitations.  Gastrointestinal: Negative for constipation, diarrhea, nausea and vomiting.  Endocrine: Negative for cold intolerance, heat intolerance, polydipsia, polyphagia and polyuria.       Blood sugars doing well   Genitourinary: Positive for frequency and urgency.  Musculoskeletal: Negative for arthralgias and back pain.  Skin: Negative for rash.  Allergic/Immunologic: Negative for environmental allergies.  Neurological: Negative for weakness and headaches.  Hematological: Negative for adenopathy.  Psychiatric/Behavioral: Negative for dysphoric mood. The patient is not nervous/anxious.      Today's Vitals   08/12/17 1442  BP: 111/77  Pulse: 81  Resp: 16  SpO2: 100%  Weight: 158 lb 3.2 oz (71.8 kg)  Height: 5\' 7"  (1.702 m)  Physical Exam  Constitutional: He is oriented to person, place, and time. He appears well-developed and well-nourished. No distress.  HENT:  Head: Normocephalic and atraumatic.  Mouth/Throat: Oropharynx is clear and moist. No oropharyngeal exudate.  Eyes: Pupils are equal, round, and reactive to light. EOM are normal.  Neck: Normal range of motion. Neck supple. No JVD present. Carotid bruit  is not present. No tracheal deviation present. No thyromegaly present.  Cardiovascular: Normal rate, regular rhythm, normal heart sounds and intact distal pulses. Exam reveals no gallop and no friction rub.  No murmur heard. Pulmonary/Chest: Effort normal and breath sounds normal. No respiratory distress. He has no wheezes. He has no rales. He exhibits no tenderness.  Abdominal: Soft. Bowel sounds are normal. There is no tenderness.  Musculoskeletal: Normal range of motion.  Lymphadenopathy:    He has no cervical adenopathy.  Neurological: He is alert and oriented to person, place, and time. No cranial nerve deficit.  Skin: Skin is warm and dry. He is not diaphoretic.  Psychiatric: He has a normal mood and affect. His behavior is normal. Judgment and thought content normal.  Nursing note and vitals reviewed.    LABS: Recent Results (from the past 2160 hour(s))  PSA     Status: None   Collection Time: 06/25/17  2:24 PM  Result Value Ref Range   Prostate Specific Ag, Serum <0.1 0.0 - 4.0 ng/mL    Comment: Roche ECLIA methodology. According to the American Urological Association, Serum PSA should decrease and remain at undetectable levels after radical prostatectomy. The AUA defines biochemical recurrence as an initial PSA value 0.2 ng/mL or greater followed by a subsequent confirmatory PSA value 0.2 ng/mL or greater. Values obtained with different assay methods or kits cannot be used interchangeably. Results cannot be interpreted as absolute evidence of the presence or absence of malignant disease.   CBC with Differential/Platelet     Status: Abnormal   Collection Time: 08/01/17  2:59 PM  Result Value Ref Range   WBC 7.2 3.8 - 10.6 K/uL   RBC 4.44 4.40 - 5.90 MIL/uL   Hemoglobin 13.2 13.0 - 18.0 g/dL   HCT 39.3 (L) 40.0 - 52.0 %   MCV 88.7 80.0 - 100.0 fL   MCH 29.8 26.0 - 34.0 pg   MCHC 33.6 32.0 - 36.0 g/dL   RDW 14.0 11.5 - 14.5 %   Platelets 93 (L) 150 - 400 K/uL     Comment: RESULT REPEATED AND VERIFIED PLATELET COUNT CONFIRMED BY SMEAR LARGE BODY PLATELETS NOTED ON SMEAR    Neutrophils Relative % 60 %   Neutro Abs 4.4 1.4 - 6.5 K/uL   Lymphocytes Relative 31 %   Lymphs Abs 2.2 1.0 - 3.6 K/uL   Monocytes Relative 6 %   Monocytes Absolute 0.4 0.2 - 1.0 K/uL   Eosinophils Relative 2 %   Eosinophils Absolute 0.1 0 - 0.7 K/uL   Basophils Relative 1 %   Basophils Absolute 0.0 0 - 0.1 K/uL    Comment: Performed at [facility], [address] [place], [place] [address]  Comprehensive metabolic panel     Status: Abnormal   Collection Time: 08/09/17  8:49 AM  Result Value Ref Range   Glucose 94 65 - 99 mg/dL   BUN 9 6 - 24 mg/dL   Creatinine, Ser 0.84 0.76 - 1.27 mg/dL   GFR calc non Af Amer 99 >59 mL/min/1.73   GFR calc Af Amer 115 >59 mL/min/1.73   BUN/Creatinine Ratio  11 9 - 20   Sodium 144 134 - 144 mmol/L   Potassium 4.3 3.5 - 5.2 mmol/L   Chloride 107 (H) 96 - 106 mmol/L   CO2 24 20 - 29 mmol/L   Calcium 9.3 8.7 - 10.2 mg/dL   Total Protein 6.2 6.0 - 8.5 g/dL   Albumin 4.1 3.5 - 5.5 g/dL   Globulin, Total 2.1 1.5 - 4.5 g/dL   Albumin/Globulin Ratio 2.0 1.2 - 2.2   Bilirubin Total <0.2 0.0 - 1.2 mg/dL   Alkaline Phosphatase 124 (H) 39 - 117 IU/L   AST 14 0 - 40 IU/L   ALT 12 0 - 44 IU/L  CBC     Status: Abnormal   Collection Time: 08/09/17  8:49 AM  Result Value Ref Range   WBC 6.9 3.4 - 10.8 x10E3/uL   RBC 4.70 4.14 - 5.80 x10E6/uL   Hemoglobin 14.0 13.0 - 17.7 g/dL   Hematocrit 41.2 37.5 - 51.0 %   MCV 88 79 - 97 fL   MCH 29.8 26.6 - 33.0 pg   MCHC 34.0 31.5 - 35.7 g/dL   RDW 13.9 12.3 - 15.4 %   Platelets 87 (LL) 150 - 379 x10E3/uL    Comment: Platelets vary in size. Actual platelet count may be somewhat higher than reported due to aggregation of platelets in this sample.    Hematology Comments: Note:     Comment: Verified by microscopic examination.  Lipid Panel w/o Chol/HDL Ratio     Status: Abnormal   Collection  Time: 08/09/17  8:49 AM  Result Value Ref Range   Cholesterol, Total 93 (L) 100 - 199 mg/dL   Triglycerides 47 0 - 149 mg/dL   HDL 26 (L) >39 mg/dL   VLDL Cholesterol Cal 9 5 - 40 mg/dL   LDL Calculated 58 0 - 99 mg/dL  Microalbumin / creatinine urine ratio     Status: None   Collection Time: 08/09/17  8:49 AM  Result Value Ref Range   Creatinine, Urine 214.8 Not Estab. mg/dL   Microalbumin, Urine 48.4 Not Estab. ug/mL   Microalb/Creat Ratio 22.5 0.0 - 30.0 mg/g creat    Comment:                      Normal:                0.0 -  30.0                      Albuminuria:          31.0 - 300.0                      Clinical albuminuria:       >300.0   T4, free     Status: None   Collection Time: 08/09/17  8:49 AM  Result Value Ref Range   Free T4 0.91 0.82 - 1.77 ng/dL  TSH     Status: None   Collection Time: 08/09/17  8:49 AM  Result Value Ref Range   TSH 2.470 0.450 - 4.500 uIU/mL  POCT HgB A1C     Status: None   Collection Time: 08/12/17  3:05 PM  Result Value Ref Range   Hemoglobin A1C 5.8   Urinalysis, Routine w reflex microscopic     Status: Abnormal   Collection Time: 08/12/17  3:20 PM  Result Value Ref Range   Specific Gravity, UA 1.026 1.005 -  1.030   pH, UA 5.5 5.0 - 7.5   Color, UA Yellow Yellow   Appearance Ur Cloudy (A) Clear   Leukocytes, UA 1+ (A) Negative   Protein, UA Trace Negative/Trace   Glucose, UA Negative Negative   Ketones, UA Trace (A) Negative   RBC, UA Negative Negative   Bilirubin, UA Negative Negative   Urobilinogen, Ur 1.0 0.2 - 1.0 mg/dL   Nitrite, UA Positive (A) Negative   Microscopic Examination See below:     Comment: Microscopic was indicated and was performed.  Microscopic Examination     Status: Abnormal   Collection Time: 08/12/17  3:20 PM  Result Value Ref Range   WBC, UA 11-30 (A) 0 - 5 /hpf   RBC, UA None seen 0 - 2 /hpf   Epithelial Cells (non renal) None seen 0 - 10 /hpf   Casts None seen None seen /lpf   Mucus, UA Present  Not Estab.   Bacteria, UA Few None seen/Few    Assessment/Plan: 1. Encounter for health maintenance examination with abnormal findings Annual wellness visit today  2. Type 2 diabetes mellitus without complication, without long-term current use of insulin (HCC) - HgbA1c on recent labs 5.8. Continue to control blood sugars through diet and exercise.   3. Prostate cancer ([place]) contniue regular visits with urology/oncology as scheduled.   4. Hypercholesteremia Cholesterol panel stable. Continue cholesterol medication as prescribed   5. Dysuria - Urinalysis, Routine w reflex microscopic   General Counseling: Briana verbalizes understanding of the findings of todays visit and agrees with plan of treatment. I have discussed any further diagnostic evaluation that may be needed or ordered today. We also reviewed his medications today. he has been encouraged to call the office with any questions or concerns that should arise related to todays visit.    This patient was seen by Leretha Pol, FNP- C in Collaboration with Dr Lavera Guise as a part of collaborative care agreement    Orders Placed This Encounter  Procedures  . Microscopic Examination  . Urinalysis, Routine w reflex microscopic  . POCT HgB A1C     Time spent: [place], MD  Internal Medicine

## 2017-08-13 LAB — URINALYSIS, ROUTINE W REFLEX MICROSCOPIC
Bilirubin, UA: NEGATIVE
Glucose, UA: NEGATIVE
NITRITE UA: POSITIVE — AB
PH UA: 5.5 (ref 5.0–7.5)
RBC, UA: NEGATIVE
SPEC GRAV UA: 1.026 (ref 1.005–1.030)
Urobilinogen, Ur: 1 mg/dL (ref 0.2–1.0)

## 2017-08-13 LAB — MICROSCOPIC EXAMINATION
CASTS: NONE SEEN /LPF
EPITHELIAL CELLS (NON RENAL): NONE SEEN /HPF (ref 0–10)
RBC MICROSCOPIC, UA: NONE SEEN /HPF (ref 0–2)

## 2017-08-16 ENCOUNTER — Telehealth: Payer: Self-pay | Admitting: Nurse Practitioner

## 2017-08-16 DIAGNOSIS — H401123 Primary open-angle glaucoma, left eye, severe stage: Secondary | ICD-10-CM | POA: Diagnosis not present

## 2017-08-16 NOTE — Telephone Encounter (Signed)
-----   Message from Corlis Hove sent at 08/16/2017  1:31 PM EDT -----   ----- Message ----- From: Ronnell Freshwater, NP Sent: 08/13/2017   8:20 AM To: Corlis Hove  Can you let Mr. Tomey know that his labs look good. They were not back yet when he was here yesterday. thanks

## 2017-08-16 NOTE — Telephone Encounter (Signed)
Pt was advised of labs being good.

## 2017-08-20 DIAGNOSIS — H401123 Primary open-angle glaucoma, left eye, severe stage: Secondary | ICD-10-CM | POA: Diagnosis not present

## 2017-09-02 DIAGNOSIS — E1165 Type 2 diabetes mellitus with hyperglycemia: Secondary | ICD-10-CM | POA: Insufficient documentation

## 2017-09-02 DIAGNOSIS — Z1211 Encounter for screening for malignant neoplasm of colon: Secondary | ICD-10-CM | POA: Insufficient documentation

## 2017-09-02 DIAGNOSIS — E1169 Type 2 diabetes mellitus with other specified complication: Secondary | ICD-10-CM | POA: Insufficient documentation

## 2017-09-02 DIAGNOSIS — R300 Dysuria: Secondary | ICD-10-CM | POA: Insufficient documentation

## 2017-09-02 DIAGNOSIS — E7800 Pure hypercholesterolemia, unspecified: Secondary | ICD-10-CM | POA: Insufficient documentation

## 2017-09-02 DIAGNOSIS — Z0001 Encounter for general adult medical examination with abnormal findings: Secondary | ICD-10-CM | POA: Insufficient documentation

## 2017-10-03 ENCOUNTER — Encounter: Payer: Self-pay | Admitting: Nurse Practitioner

## 2017-10-14 ENCOUNTER — Other Ambulatory Visit: Payer: Self-pay

## 2017-10-14 ENCOUNTER — Encounter: Payer: Self-pay | Admitting: Emergency Medicine

## 2017-10-14 DIAGNOSIS — Y999 Unspecified external cause status: Secondary | ICD-10-CM | POA: Insufficient documentation

## 2017-10-14 DIAGNOSIS — S39012A Strain of muscle, fascia and tendon of lower back, initial encounter: Secondary | ICD-10-CM | POA: Insufficient documentation

## 2017-10-14 DIAGNOSIS — Z8546 Personal history of malignant neoplasm of prostate: Secondary | ICD-10-CM | POA: Insufficient documentation

## 2017-10-14 DIAGNOSIS — Y929 Unspecified place or not applicable: Secondary | ICD-10-CM | POA: Diagnosis not present

## 2017-10-14 DIAGNOSIS — Y939 Activity, unspecified: Secondary | ICD-10-CM | POA: Diagnosis not present

## 2017-10-14 DIAGNOSIS — X500XXA Overexertion from strenuous movement or load, initial encounter: Secondary | ICD-10-CM | POA: Diagnosis not present

## 2017-10-14 DIAGNOSIS — M545 Low back pain: Secondary | ICD-10-CM | POA: Diagnosis not present

## 2017-10-14 DIAGNOSIS — Z79899 Other long term (current) drug therapy: Secondary | ICD-10-CM | POA: Diagnosis not present

## 2017-10-14 DIAGNOSIS — E119 Type 2 diabetes mellitus without complications: Secondary | ICD-10-CM | POA: Insufficient documentation

## 2017-10-14 DIAGNOSIS — F17210 Nicotine dependence, cigarettes, uncomplicated: Secondary | ICD-10-CM | POA: Diagnosis not present

## 2017-10-14 DIAGNOSIS — S3992XA Unspecified injury of lower back, initial encounter: Secondary | ICD-10-CM | POA: Diagnosis present

## 2017-10-14 NOTE — ED Triage Notes (Signed)
Pt arrived to the ED for complaints of back pain secondary to lifting a table. Pt reports that he went to lift a table and felt like he pulled a muscle and felt into the ground because of the pain. Pt reports that he was unable to get back up. Pt is Aox4 in no apparent distress during triage; pt reports taking a muscle relaxer.

## 2017-10-15 ENCOUNTER — Emergency Department
Admission: EM | Admit: 2017-10-15 | Discharge: 2017-10-15 | Disposition: A | Discharge status: Home / Self Care | Payer: BLUE CROSS/BLUE SHIELD | Attending: Emergency Medicine | Admitting: Emergency Medicine

## 2017-10-15 ENCOUNTER — Telehealth: Payer: Self-pay | Admitting: *Deleted

## 2017-10-15 DIAGNOSIS — S39012A Strain of muscle, fascia and tendon of lower back, initial encounter: Secondary | ICD-10-CM

## 2017-10-15 DIAGNOSIS — M545 Low back pain, unspecified: Secondary | ICD-10-CM

## 2017-10-15 MED ORDER — DIAZEPAM 2 MG PO TABS: 2.0000 mg | ORAL_TABLET | Freq: Three times a day (TID) | ORAL | 0 refills | Status: DC | PRN

## 2017-10-15 MED ORDER — OXYCODONE-ACETAMINOPHEN 5-325 MG PO TABS: 1.0000 | ORAL_TABLET | ORAL | 0 refills | Status: DC | PRN

## 2017-10-15 MED ORDER — IBUPROFEN 600 MG PO TABS: 600.0000 mg | ORAL_TABLET | Freq: Three times a day (TID) | ORAL | 0 refills | Status: DC | PRN

## 2017-10-15 MED ORDER — OXYCODONE-ACETAMINOPHEN 5-325 MG PO TABS
1.0000 | ORAL_TABLET | Freq: Once | ORAL | Status: AC
  Administered 2017-10-15: 1 via ORAL
  Filled 2017-10-15: qty 1

## 2017-10-15 MED ORDER — DIAZEPAM 2 MG PO TABS
2.0000 mg | ORAL_TABLET | Freq: Once | ORAL | Status: AC
  Administered 2017-10-15: 2 mg via ORAL
  Filled 2017-10-15: qty 1

## 2017-10-15 MED ORDER — KETOROLAC TROMETHAMINE 30 MG/ML IJ SOLN
30.0000 mg | Freq: Once | INTRAMUSCULAR | Status: AC
  Administered 2017-10-15: 30 mg via INTRAMUSCULAR
  Filled 2017-10-15: qty 1

## 2017-10-15 NOTE — Telephone Encounter (Signed)
Entered in error

## 2017-10-15 NOTE — Discharge Instructions (Signed)
1. You may take medicines as needed for pain and muscle spasms (Motrin/Percocet/Valium #15). °2. Apply moist heat to affected area several times daily. °3. Return to the ER for worsening symptoms, persistent vomiting, difficulty breathing or other concerns. °

## 2017-10-15 NOTE — ED Provider Notes (Signed)
[facility] Emergency Department Provider Note   ____________________________________________   First MD Initiated Contact with Patient 10/15/17 0109     (approximate)  I have reviewed the triage vital signs and the nursing notes.   HISTORY  Chief Complaint Back Pain    HPI Donald Mendoza is a 57 y.o. male who presents to the ED from home with a chief complaint of low back pain.  Patient was moving furniture by himself; lifted a heavy table and felt like he pulled a muscle in his left lower back.  The pain and muscle spasms were enough to bring him to the ground and he was not able to get back up.  A friend gave him a muscle relaxer and he waited until he was relaxed enough to be able to come to the emergency department.  Denies other injuries.  Other than pain and muscle spasms, patient denies extremity weakness, numbness/tingling, bowel or bladder incontinence.   Past Medical History:  Diagnosis Date  . B12 deficiency 04/21/2017  . Cancer [facility]) 2018   Prostate Cancer  . Diverticulosis   . GERD (gastroesophageal reflux disease)   . Hypercholesteremia     Patient Active Problem List   Diagnosis Date Noted  . Type 2 diabetes mellitus with hyperglycemia ([place]) 09/02/2017  . Encounter for health maintenance examination with abnormal findings 09/02/2017  . Dysuria 09/02/2017  . Hypercholesteremia 09/02/2017  . Prostate cancer ([place]) 05/26/2017  . B12 deficiency 04/21/2017  . Vitamin D deficiency disease 03/14/2017  . Elevated PSA 07/18/2016  . Urinary hesitancy 07/18/2016    Past Surgical History:  Procedure Laterality Date  . COLONOSCOPY    . HERNIA REPAIR     10 years ago, [place]. umbilical hernia x 2  . PELVIC LYMPH NODE DISSECTION Bilateral 05/27/2017   Procedure: PELVIC LYMPH NODE DISSECTION;  Surgeon: Hollice Espy, MD;  Location: [facility] ORS;  Service: Urology;  Laterality: Bilateral;  . ROBOT ASSISTED LAPAROSCOPIC RADICAL PROSTATECTOMY N/A  05/27/2017   Procedure: ROBOTIC ASSISTED LAPAROSCOPIC RADICAL PROSTATECTOMY;  Surgeon: Hollice Espy, MD;  Location: [facility] ORS;  Service: Urology;  Laterality: N/A;    Prior to Admission medications   Medication Sig Start Date End Date Taking? Authorizing Provider  latanoprost (XALATAN) 0.005 % ophthalmic solution Place 1 drop at bedtime into both eyes.  12/09/15   [provider]  pravastatin (PRAVACHOL) 40 MG tablet Take 1 tablet (40 mg total) by mouth daily. 07/17/17   Ronnell Freshwater, NP  sildenafil (REVATIO) 20 MG tablet Take 1 tablet (20 mg total) by mouth as needed. Take 1-5 tabs as needed prior to intercourse Patient not taking: Reported on 08/12/2017 06/25/17   Hollice Espy, MD    Allergies Sulfa antibiotics  Family History  Problem Relation Age of Onset  . Diabetes Mother   . Breast cancer Mother   . Arthritis Father   . Breast cancer Sister     Social History Social History   Tobacco Use  . Smoking status: Current Some Day Smoker    Packs/day: 0.25    Years: 34.00    Pack years: 8.50    Types: Cigarettes    Last attempt to quit: 04/06/2017    Years since quitting: 0.5  . Smokeless tobacco: Never Used  Substance Use Topics  . Alcohol use: Not Currently  . Drug use: No    Review of Systems  Constitutional: No fever/chills Eyes: No visual changes. ENT: No sore throat. Cardiovascular: Denies chest pain. Respiratory: Denies  shortness of breath. Gastrointestinal: No abdominal pain.  No nausea, no vomiting.  No diarrhea.  No constipation. Genitourinary: Negative for dysuria. Musculoskeletal: Positive for back pain. Skin: Negative for rash. Neurological: Negative for headaches, focal weakness or numbness.   ____________________________________________   PHYSICAL EXAM:  VITAL SIGNS: ED Triage Vitals  Enc Vitals Group     BP 10/14/17 2247 129/81     Pulse Rate 10/14/17 2247 67     Resp 10/14/17 2247 18     Temp 10/14/17 2247 98.1 F (36.7 C)       Temp Source 10/14/17 2247 Oral     SpO2 10/14/17 2247 100 %     Weight 10/14/17 2248 164 lb (74.4 kg)     Height 10/14/17 2248 5\' 7"  (1.702 m)     Head Circumference --      Peak Flow --      Pain Score 10/14/17 2248 7     Pain Loc --      Pain Edu? --      Excl. in [place]? --     Constitutional: Alert and oriented. Well appearing and in no acute distress. Eyes: Conjunctivae are normal. PERRL. EOMI. Head: Atraumatic. Nose: No congestion/rhinnorhea. Mouth/Throat: Mucous membranes are moist.  Oropharynx non-erythematous. Neck: No stridor.  No cervical spine tenderness to palpation. Cardiovascular: Normal rate, regular rhythm. Grossly normal heart sounds.  Good peripheral circulation. Respiratory: Normal respiratory effort.  No retractions. Lungs CTAB. Gastrointestinal: Soft and nontender. No distention. No abdominal bruits. No CVA tenderness. Musculoskeletal: No spinal tenderness to palpation.  Left lumbar paraspinal muscle spasms.  Negative straight leg raise.  No lower extremity tenderness nor edema.  No joint effusions. Neurologic:  Normal speech and language. No gross focal neurologic deficits are appreciated.  Skin:  Skin is warm, dry and intact. No rash noted. Psychiatric: Mood and affect are normal. Speech and behavior are normal.  ____________________________________________   LABS (all labs ordered are listed, but only abnormal results are displayed)  Labs Reviewed - No data to display ____________________________________________  EKG  None ____________________________________________  RADIOLOGY  ED MD interpretation: None  Official radiology report(s): No results found.  ____________________________________________   PROCEDURES  Procedure(s) performed: None  Procedures  Critical Care performed: No  ____________________________________________   INITIAL IMPRESSION / ASSESSMENT AND PLAN / ED COURSE  As part of my medical decision making, I reviewed  the following data within the [place] notes reviewed and incorporated and Notes from prior ED visits   57 year old male who presents with lumbar strain after lifting furniture.  No focal neurological deficits on examination.  Will treat with IM Toradol, Percocet and Valium.  Strict return precautions given.  Patient verbalizes understanding and agrees with plan of care.      ____________________________________________   FINAL CLINICAL IMPRESSION(S) / ED DIAGNOSES  Final diagnoses:  Strain of lumbar region, initial encounter  Acute left-sided low back pain without sciatica     ED Discharge Orders    None       Note:  This document was prepared using Dragon voice recognition software and may include unintentional dictation errors.    Paulette Blanch, MD 10/15/17 [phone]

## 2017-10-31 ENCOUNTER — Other Ambulatory Visit: Payer: Self-pay

## 2017-10-31 ENCOUNTER — Other Ambulatory Visit: Payer: Self-pay | Admitting: Oncology

## 2017-10-31 ENCOUNTER — Encounter: Payer: Self-pay | Admitting: Oncology

## 2017-10-31 ENCOUNTER — Inpatient Hospital Stay (HOSPITAL_BASED_OUTPATIENT_CLINIC_OR_DEPARTMENT_OTHER): Payer: BLUE CROSS/BLUE SHIELD | Admitting: Oncology

## 2017-10-31 ENCOUNTER — Inpatient Hospital Stay: Payer: BLUE CROSS/BLUE SHIELD | Attending: Oncology

## 2017-10-31 VITALS — BP 117/76 | HR 69 | Temp 96.7°F | Wt 155.4 lb

## 2017-10-31 DIAGNOSIS — C61 Malignant neoplasm of prostate: Secondary | ICD-10-CM

## 2017-10-31 DIAGNOSIS — E538 Deficiency of other specified B group vitamins: Secondary | ICD-10-CM | POA: Insufficient documentation

## 2017-10-31 DIAGNOSIS — Z720 Tobacco use: Secondary | ICD-10-CM

## 2017-10-31 DIAGNOSIS — Z789 Other specified health status: Secondary | ICD-10-CM

## 2017-10-31 DIAGNOSIS — R5382 Chronic fatigue, unspecified: Secondary | ICD-10-CM

## 2017-10-31 DIAGNOSIS — D696 Thrombocytopenia, unspecified: Secondary | ICD-10-CM

## 2017-10-31 DIAGNOSIS — Z7289 Other problems related to lifestyle: Secondary | ICD-10-CM

## 2017-10-31 DIAGNOSIS — F1090 Alcohol use, unspecified, uncomplicated: Secondary | ICD-10-CM

## 2017-10-31 LAB — COMPREHENSIVE METABOLIC PANEL
ALT: 20 U/L (ref 17–63)
ANION GAP: 8 (ref 5–15)
AST: 20 U/L (ref 15–41)
Albumin: 4.1 g/dL (ref 3.5–5.0)
Alkaline Phosphatase: 104 U/L (ref 38–126)
BILIRUBIN TOTAL: 0.3 mg/dL (ref 0.3–1.2)
BUN: 15 mg/dL (ref 6–20)
CHLORIDE: 109 mmol/L (ref 101–111)
CO2: 23 mmol/L (ref 22–32)
Calcium: 9 mg/dL (ref 8.9–10.3)
Creatinine, Ser: 0.73 mg/dL (ref 0.61–1.24)
Glucose, Bld: 128 mg/dL — ABNORMAL HIGH (ref 65–99)
POTASSIUM: 3.5 mmol/L (ref 3.5–5.1)
Sodium: 140 mmol/L (ref 135–145)
TOTAL PROTEIN: 6.6 g/dL (ref 6.5–8.1)

## 2017-10-31 LAB — CBC WITH DIFFERENTIAL/PLATELET
BASOS ABS: 0 10*3/uL (ref 0–0.1)
BASOS PCT: 0 %
Eosinophils Absolute: 0.1 10*3/uL (ref 0–0.7)
Eosinophils Relative: 2 %
HEMATOCRIT: 41.5 % (ref 40.0–52.0)
Hemoglobin: 14.2 g/dL (ref 13.0–18.0)
Lymphocytes Relative: 36 %
Lymphs Abs: 1.8 10*3/uL (ref 1.0–3.6)
MCH: 29.3 pg (ref 26.0–34.0)
MCHC: 34.3 g/dL (ref 32.0–36.0)
MCV: 85.5 fL (ref 80.0–100.0)
Monocytes Absolute: 0.2 10*3/uL (ref 0.2–1.0)
Monocytes Relative: 5 %
Neutro Abs: 2.8 10*3/uL (ref 1.4–6.5)
Neutrophils Relative %: 57 %
PLATELETS: 63 10*3/uL — AB (ref 150–400)
RBC: 4.85 MIL/uL (ref 4.40–5.90)
RDW: 15 % — AB (ref 11.5–14.5)
WBC: 4.9 10*3/uL (ref 3.8–10.6)

## 2017-10-31 LAB — PSA

## 2017-10-31 NOTE — Progress Notes (Signed)
Hematology/Oncology  Follow up note [facility] [phone] [phone]   Patient Care Team: Ronnell Freshwater, NP as PCP - General (Family Medicine) Christie Nottingham, PA as Referring Physician (Physician Assistant) Christene Lye, MD (General Surgery)  PURPOSE OF CONSULTATION:  Follow up of management of thrombocytopenia.  HISTORY OF PRESENTING ILLNESS:  Donald Mendoza is a  57 y.o.  male with PMH listed below who was referred to me for evaluation of thrombocytopenia. Patient was recently diagnosed with intermediate risk prostate cancer in the follows up with Dr. Erlene Quan. Initially he was diagnosed with a low-risk prostate cancer Gleason score 3+3 in March 2018 when his PSA was 4. PSA continued to rise to 5.2 and patient had a repeat prostate biopsy revealing Gleason score 4+3. Patient was scheduled for radical prostatectomy however was found to have thrombocytopenia. Referred to [place] to evaluation for the low platelet count. Patient reports that he has been told that his platelet has been low in the past. He never received any treatment.  # S/p RALP with BPLND 05/27/2017, pathology showed Gleason 4+3, pT2 N0, NEGATIVE FOR EXTRAPROSTATIC EXTENSION, SEMINAL VESICLE INVASION, AND BLADDER NECK INVASION. - MARGINS NEGATIVE FOR CARCINOMA.  # Prostate cancer pT2N0, PSA <0.1, no adverse features, , no adjuvant treatment was offered.   INTERVAL HISTORY Patient presents to follow up for management of thrombocytopenia.  Thrombocytopenia, worsened.  Platelet decreased from 87,000 to 63,000.  Patient denies any  hematochezia, hematuria, hematemesis, epistaxis, black tarry stool or easy bruising.   Reports feeling fatigue which is chronic.  No better no worse.  Review of Systems  Constitutional: Negative for chills, fever, malaise/fatigue and weight loss.  HENT: Negative for congestion, ear discharge, ear pain, hearing loss, nosebleeds, sinus pain, sore  throat and tinnitus.   Eyes: Negative for blurred vision, double vision, photophobia, pain, discharge and redness.  Respiratory: Negative for cough, hemoptysis, sputum production, shortness of breath and wheezing.   Cardiovascular: Negative for chest pain, palpitations, orthopnea, claudication and leg swelling.  Gastrointestinal: Negative for abdominal pain, blood in stool, constipation, diarrhea, heartburn, melena, nausea and vomiting.  Genitourinary: Negative for dysuria, flank pain, frequency, hematuria and urgency.       Leaky, erection dysfunction.   Musculoskeletal: Negative for back pain, myalgias and neck pain.  Skin: Negative for itching and rash.  Neurological: Negative for dizziness, tingling, tremors, speech change, focal weakness, weakness and headaches.  Endo/Heme/Allergies: Negative for environmental allergies. Does not bruise/bleed easily.  Psychiatric/Behavioral: Negative for depression, hallucinations and substance abuse. The patient is not nervous/anxious.     MEDICAL HISTORY:  Past Medical History:  Diagnosis Date  . B12 deficiency 04/21/2017  . Cancer [facility]) 2018   Prostate Cancer  . Diverticulosis   . GERD (gastroesophageal reflux disease)   . Hypercholesteremia     SURGICAL HISTORY: Past Surgical History:  Procedure Laterality Date  . COLONOSCOPY    . HERNIA REPAIR     10 years ago, [place]. umbilical hernia x 2  . PELVIC LYMPH NODE DISSECTION Bilateral 05/27/2017   Procedure: PELVIC LYMPH NODE DISSECTION;  Surgeon: Hollice Espy, MD;  Location: [facility] ORS;  Service: Urology;  Laterality: Bilateral;  . ROBOT ASSISTED LAPAROSCOPIC RADICAL PROSTATECTOMY N/A 05/27/2017   Procedure: ROBOTIC ASSISTED LAPAROSCOPIC RADICAL PROSTATECTOMY;  Surgeon: Hollice Espy, MD;  Location: [facility] ORS;  Service: Urology;  Laterality: N/A;    SOCIAL HISTORY: Social History   Socioeconomic History  . Marital status: Single    Spouse name: Not on  file  . Number of children: Not on  file  . Years of education: Not on file  . Highest education level: Not on file  Occupational History  . Not on file  Social Needs  . Financial resource strain: Not on file  . Food insecurity:    Worry: Not on file    Inability: Not on file  . Transportation needs:    Medical: Not on file    Non-medical: Not on file  Tobacco Use  . Smoking status: Current Some Day Smoker    Packs/day: 0.25    Years: 34.00    Pack years: 8.50    Types: Cigarettes    Last attempt to quit: 04/06/2017    Years since quitting: 0.5  . Smokeless tobacco: Never Used  Substance and Sexual Activity  . Alcohol use: Not Currently  . Drug use: No  . Sexual activity: Yes  Lifestyle  . Physical activity:    Days per week: Not on file    Minutes per session: Not on file  . Stress: Not on file  Relationships  . Social connections:    Talks on phone: Not on file    Gets together: Not on file    Attends religious service: Not on file    Active member of club or organization: Not on file    Attends meetings of clubs or organizations: Not on file    Relationship status: Not on file  . Intimate partner violence:    Fear of current or ex partner: Not on file    Emotionally abused: Not on file    Physically abused: Not on file    Forced sexual activity: Not on file  Other Topics Concern  . Not on file  Social History Narrative  . Not on file    FAMILY HISTORY: Family History  Problem Relation Age of Onset  . Diabetes Mother   . Breast cancer Mother   . Arthritis Father   . Breast cancer Sister     ALLERGIES:  is allergic to sulfa antibiotics.  MEDICATIONS:  Current Outpatient Medications  Medication Sig Dispense Refill  . diazepam (VALIUM) 2 MG tablet Take 1 tablet (2 mg total) by mouth every 8 (eight) hours as needed for muscle spasms. 15 tablet 0  . ibuprofen (ADVIL,MOTRIN) 600 MG tablet Take 1 tablet (600 mg total) by mouth every 8 (eight) hours as needed. 15 tablet 0  . latanoprost  (XALATAN) 0.005 % ophthalmic solution Place 1 drop at bedtime into both eyes.   0  . oxyCODONE-acetaminophen (PERCOCET/ROXICET) 5-325 MG tablet Take 1 tablet by mouth every 4 (four) hours as needed for severe pain. 15 tablet 0  . pravastatin (PRAVACHOL) 40 MG tablet Take 1 tablet (40 mg total) by mouth daily. 30 tablet 6  . sildenafil (REVATIO) 20 MG tablet Take 1 tablet (20 mg total) by mouth as needed. Take 1-5 tabs as needed prior to intercourse 30 tablet 11   No current facility-administered medications for this visit.      PHYSICAL EXAMINATION: ECOG PERFORMANCE STATUS: 0 - Asymptomatic Vitals:   10/31/17 1504  BP: 117/76  Pulse: 69  Temp: (!) 96.7 F (35.9 C)   Filed Weights   10/31/17 1504  Weight: 155 lb 6 oz (70.5 kg)    Physical Exam  Constitutional: He is oriented to person, place, and time and well-developed, well-nourished, and in no distress. No distress.  HENT:  Head: Normocephalic and atraumatic.  Nose: Nose normal.  Mouth/Throat:  Oropharynx is clear and moist. No oropharyngeal exudate.  Eyes: Pupils are equal, round, and reactive to light. Conjunctivae and EOM are normal. Left eye exhibits no discharge. No scleral icterus.  Neck: Normal range of motion. Neck supple. No JVD present.  Cardiovascular: Normal rate, regular rhythm and normal heart sounds.  No murmur heard. Pulmonary/Chest: Effort normal and breath sounds normal. No respiratory distress. He has no wheezes. He has no rales. He exhibits no tenderness.  Abdominal: Soft. Bowel sounds are normal. He exhibits no distension and no mass. There is no tenderness. There is no rebound and no guarding.  Musculoskeletal: Normal range of motion. He exhibits no edema or tenderness.  Lymphadenopathy:    He has no cervical adenopathy.  Neurological: He is alert and oriented to person, place, and time. No cranial nerve deficit. He exhibits normal muscle tone. Coordination normal.  Skin: Skin is warm and dry. No rash  noted. He is not diaphoretic. No erythema.  Psychiatric: Memory, affect and judgment normal.     LABORATORY DATA:  I have reviewed the data as listed Lab Results  Component Value Date   WBC 4.9 10/31/2017   HGB 14.2 10/31/2017   HCT 41.5 10/31/2017   MCV 85.5 10/31/2017   PLT 63 (L) 10/31/2017   Recent Labs    04/10/17 1605  05/28/17 0356 08/09/17 0849 10/31/17 1445  NA 142   < > 139 144 140  K 3.7   < > 4.4 4.3 3.5  CL 106   < > 109 107* 109  CO2 27   < > 25 24 23   GLUCOSE 112*   < > 118* 94 128*  BUN 13   < > 12 9 15   CREATININE 0.69   < > 1.06 0.84 0.73  CALCIUM 9.3   < > 8.7* 9.3 9.0  GFRNONAA >60   < > >60 99 >60  GFRAA >60   < > >60 115 >60  PROT 7.5  --   --  6.2 6.6  ALBUMIN 4.3  --   --  4.1 4.1  AST 23  --   --  14 20  ALT 18  --   --  12 20  ALKPHOS 115  --   --  124* 104  BILITOT 0.5  --   --  <0.2 0.3   < > = values in this interval not displayed.    RADIOGRAPHIC STUDIES: I have personally reviewed the radiological images as listed and agreed with the findings in the report. US Abdomen showed no liver parachyema changes or splenomegaly.    ASSESSMENT & PLAN:  1. Thrombocytopenia ([place])   2. Prostate cancer ([place])   3. Alcohol use   4. B12 deficiency      # Chronic thrombocytopenia:  Work-up including negative peripheral blood flow cytometry, negative hepatitis, negative HIV.  Normal folate.  Normal blood smear.  B12 was low and was replaced.  Normal spleen size.  Platelet counts decreased since last visit.  Suspect ITP. Discussed with patient that I recommend a bone marrow biopsy to clarify.  Patient agrees with plan..  Check CBC every 2 weeks to trend platelet counts. Discussed with patient to avoid alcohol and NSAIDs. B12 deficiency has been repleted.  All questions were answered. The patient knows to call the clinic with any problems questions or concerns.  Return of visit: 1 week after bone marrow biopsy to discuss about results.Earlie Server,  MD, PhD Hematology [place]  at [place]- [phone] 10/31/2017

## 2017-10-31 NOTE — Progress Notes (Signed)
Patient here today for follow up.  Patient states no new concerns today  

## 2017-10-31 NOTE — Progress Notes (Signed)
PSA

## 2017-11-01 NOTE — Addendum Note (Signed)
Addended by: Earlie Server on: 11/01/2017 02:49 PM   Modules accepted: Orders

## 2017-11-14 ENCOUNTER — Other Ambulatory Visit: Payer: Self-pay | Admitting: Radiology

## 2017-11-14 ENCOUNTER — Inpatient Hospital Stay: Payer: BLUE CROSS/BLUE SHIELD

## 2017-11-14 ENCOUNTER — Other Ambulatory Visit: Payer: Self-pay | Admitting: Student

## 2017-11-14 DIAGNOSIS — D696 Thrombocytopenia, unspecified: Secondary | ICD-10-CM | POA: Diagnosis not present

## 2017-11-14 DIAGNOSIS — R5382 Chronic fatigue, unspecified: Secondary | ICD-10-CM | POA: Diagnosis not present

## 2017-11-14 DIAGNOSIS — Z720 Tobacco use: Secondary | ICD-10-CM | POA: Diagnosis not present

## 2017-11-14 DIAGNOSIS — E538 Deficiency of other specified B group vitamins: Secondary | ICD-10-CM | POA: Diagnosis not present

## 2017-11-14 DIAGNOSIS — C61 Malignant neoplasm of prostate: Secondary | ICD-10-CM | POA: Diagnosis not present

## 2017-11-14 LAB — CBC WITH DIFFERENTIAL/PLATELET
Basophils Absolute: 0 10*3/uL (ref 0–0.1)
Basophils Relative: 1 %
EOS ABS: 0.1 10*3/uL (ref 0–0.7)
EOS PCT: 3 %
HCT: 40.5 % (ref 40.0–52.0)
Hemoglobin: 13.5 g/dL (ref 13.0–18.0)
LYMPHS PCT: 39 %
Lymphs Abs: 1.9 10*3/uL (ref 1.0–3.6)
MCH: 29 pg (ref 26.0–34.0)
MCHC: 33.3 g/dL (ref 32.0–36.0)
MCV: 86.9 fL (ref 80.0–100.0)
MONOS PCT: 8 %
Monocytes Absolute: 0.4 10*3/uL (ref 0.2–1.0)
Neutro Abs: 2.4 10*3/uL (ref 1.4–6.5)
Neutrophils Relative %: 49 %
PLATELETS: 61 10*3/uL — AB (ref 150–400)
RBC: 4.66 MIL/uL (ref 4.40–5.90)
RDW: 15.7 % — ABNORMAL HIGH (ref 11.5–14.5)
WBC: 4.9 10*3/uL (ref 3.8–10.6)

## 2017-11-15 ENCOUNTER — Ambulatory Visit
Admission: RE | Admit: 2017-11-15 | Discharge: 2017-11-15 | Disposition: A | Discharge status: Home / Self Care | Payer: BLUE CROSS/BLUE SHIELD | Source: Ambulatory Visit | Attending: Oncology | Admitting: Oncology

## 2017-11-15 ENCOUNTER — Other Ambulatory Visit (HOSPITAL_COMMUNITY)
Admission: RE | Admit: 2017-11-15 | Disposition: A | Discharge status: Home / Self Care | Payer: BLUE CROSS/BLUE SHIELD | Source: Ambulatory Visit | Attending: Oncology | Admitting: Oncology

## 2017-11-15 DIAGNOSIS — E785 Hyperlipidemia, unspecified: Secondary | ICD-10-CM | POA: Diagnosis not present

## 2017-11-15 DIAGNOSIS — Z79899 Other long term (current) drug therapy: Secondary | ICD-10-CM | POA: Insufficient documentation

## 2017-11-15 DIAGNOSIS — E538 Deficiency of other specified B group vitamins: Secondary | ICD-10-CM | POA: Insufficient documentation

## 2017-11-15 DIAGNOSIS — D696 Thrombocytopenia, unspecified: Secondary | ICD-10-CM

## 2017-11-15 DIAGNOSIS — R5383 Other fatigue: Secondary | ICD-10-CM | POA: Insufficient documentation

## 2017-11-15 DIAGNOSIS — Z8546 Personal history of malignant neoplasm of prostate: Secondary | ICD-10-CM | POA: Insufficient documentation

## 2017-11-15 DIAGNOSIS — K219 Gastro-esophageal reflux disease without esophagitis: Secondary | ICD-10-CM | POA: Insufficient documentation

## 2017-11-15 DIAGNOSIS — F17210 Nicotine dependence, cigarettes, uncomplicated: Secondary | ICD-10-CM | POA: Insufficient documentation

## 2017-11-15 DIAGNOSIS — E7800 Pure hypercholesterolemia, unspecified: Secondary | ICD-10-CM | POA: Diagnosis not present

## 2017-11-15 DIAGNOSIS — Z882 Allergy status to sulfonamides status: Secondary | ICD-10-CM | POA: Diagnosis not present

## 2017-11-15 LAB — CBC
HCT: 43.3 % (ref 40.0–52.0)
Hemoglobin: 14.7 g/dL (ref 13.0–18.0)
MCH: 29.5 pg (ref 26.0–34.0)
MCHC: 34 g/dL (ref 32.0–36.0)
MCV: 86.8 fL (ref 80.0–100.0)
PLATELETS: 56 10*3/uL — AB (ref 150–440)
RBC: 4.99 MIL/uL (ref 4.40–5.90)
RDW: 15.3 % — AB (ref 11.5–14.5)
WBC: 6.2 10*3/uL (ref 3.8–10.6)

## 2017-11-15 LAB — APTT: aPTT: 34 seconds (ref 24–36)

## 2017-11-15 LAB — PROTIME-INR
INR: 1.03
PROTHROMBIN TIME: 13.4 s (ref 11.4–15.2)

## 2017-11-15 MED ORDER — SODIUM CHLORIDE 0.9 % IV SOLN
INTRAVENOUS | Status: DC
  Administered 2017-11-15: 08:00:00 via INTRAVENOUS

## 2017-11-15 MED ORDER — MIDAZOLAM HCL 5 MG/5ML IJ SOLN
INTRAMUSCULAR | Status: AC
  Filled 2017-11-15: qty 10

## 2017-11-15 MED ORDER — FENTANYL CITRATE (PF) 100 MCG/2ML IJ SOLN
INTRAMUSCULAR | Status: AC | PRN
  Administered 2017-11-15 (×2): 50 ug via INTRAVENOUS

## 2017-11-15 MED ORDER — FENTANYL CITRATE (PF) 100 MCG/2ML IJ SOLN
INTRAMUSCULAR | Status: AC
  Filled 2017-11-15: qty 4

## 2017-11-15 MED ORDER — HEPARIN SOD (PORK) LOCK FLUSH 100 UNIT/ML IV SOLN
INTRAVENOUS | Status: AC
  Filled 2017-11-15: qty 5

## 2017-11-15 MED ORDER — MIDAZOLAM HCL 5 MG/5ML IJ SOLN
INTRAMUSCULAR | Status: AC | PRN
  Administered 2017-11-15 (×2): 1 mg via INTRAVENOUS

## 2017-11-15 MED ORDER — LIDOCAINE HCL 1 % IJ SOLN
INTRAMUSCULAR | Status: AC | PRN
  Administered 2017-11-15: 3 mL

## 2017-11-15 NOTE — Progress Notes (Signed)
Called 2x for lab draw since 0730: lab arrived after 20 min. Unable to obtain labs from IV start. Pt. Tolerated sticks well.

## 2017-11-15 NOTE — Sedation Documentation (Signed)
Total sedation: Versed 2 mg IV, Fentanyl 100 mcg IV. Pt. Tolerated procedure well.  

## 2017-11-15 NOTE — Consult Note (Signed)
Chief Complaint: Thrombocytopenia  Referring Physician(s): Yu,Zhou   Patient Status: [facility] - Out-pt  History of Present Illness: Donald Mendoza is a 57 y.o. male with past medical history significant for prostate cancer and hyperlipidemia who presents today to the radiology department for CT-guided bone marrow biopsy and aspiration secondary to thrombocytopenia.  Patient is accompanied by his wife though serves as his own historian.  Patient admits to his baseline level of fatigue but is otherwise without complaint.  Patient denies chest pain, fever, chills or shortness of breath.  Patient denies hematochezia, hematuria hematemesis, epistaxis, black tarry stool or easy bruising.  Past Medical History:  Diagnosis Date  . B12 deficiency 04/21/2017  . Cancer [facility]) 2018   Prostate Cancer  . Diverticulosis   . GERD (gastroesophageal reflux disease)   . Hypercholesteremia     Past Surgical History:  Procedure Laterality Date  . COLONOSCOPY    . HERNIA REPAIR     10 years ago, [place]. umbilical hernia x 2  . PELVIC LYMPH NODE DISSECTION Bilateral 05/27/2017   Procedure: PELVIC LYMPH NODE DISSECTION;  Surgeon: Hollice Espy, MD;  Location: [facility] ORS;  Service: Urology;  Laterality: Bilateral;  . ROBOT ASSISTED LAPAROSCOPIC RADICAL PROSTATECTOMY N/A 05/27/2017   Procedure: ROBOTIC ASSISTED LAPAROSCOPIC RADICAL PROSTATECTOMY;  Surgeon: Hollice Espy, MD;  Location: [facility] ORS;  Service: Urology;  Laterality: N/A;    Allergies: Sulfa antibiotics  Medications: Prior to Admission medications   Medication Sig Start Date End Date Taking? Authorizing Provider  latanoprost (XALATAN) 0.005 % ophthalmic solution Place 1 drop at bedtime into both eyes.  12/09/15  Yes [provider]  pravastatin (PRAVACHOL) 40 MG tablet Take 1 tablet (40 mg total) by mouth daily. 07/17/17  Yes Boscia, Heather E, NP  diazepam (VALIUM) 2 MG tablet Take 1 tablet (2 mg total) by mouth every 8 (eight)  hours as needed for muscle spasms. Patient not taking: Reported on 11/15/2017 10/15/17   Paulette Blanch, MD  ibuprofen (ADVIL,MOTRIN) 600 MG tablet Take 1 tablet (600 mg total) by mouth every 8 (eight) hours as needed. Patient not taking: Reported on 11/15/2017 10/15/17   Paulette Blanch, MD  oxyCODONE-acetaminophen (PERCOCET/ROXICET) 5-325 MG tablet Take 1 tablet by mouth every 4 (four) hours as needed for severe pain. Patient not taking: Reported on 11/15/2017 10/15/17   Paulette Blanch, MD  sildenafil (REVATIO) 20 MG tablet Take 1 tablet (20 mg total) by mouth as needed. Take 1-5 tabs as needed prior to intercourse Patient not taking: Reported on 11/15/2017 06/25/17   Hollice Espy, MD     Family History  Problem Relation Age of Onset  . Diabetes Mother   . Breast cancer Mother   . Arthritis Father   . Breast cancer Sister     Social History   Socioeconomic History  . Marital status: Single    Spouse name: Not on file  . Number of children: Not on file  . Years of education: Not on file  . Highest education level: Not on file  Occupational History  . Not on file  Social Needs  . Financial resource strain: Not on file  . Food insecurity:    Worry: Not on file    Inability: Not on file  . Transportation needs:    Medical: Not on file    Non-medical: Not on file  Tobacco Use  . Smoking status: Current Some Day Smoker    Packs/day: 0.50    Years: 34.00  Pack years: 17.00    Types: Cigarettes  . Smokeless tobacco: Never Used  . Tobacco comment: restarted 1 week ago  Substance and Sexual Activity  . Alcohol use: Not Currently  . Drug use: No  . Sexual activity: Yes  Lifestyle  . Physical activity:    Days per week: Not on file    Minutes per session: Not on file  . Stress: Not on file  Relationships  . Social connections:    Talks on phone: Not on file    Gets together: Not on file    Attends religious service: Not on file    Active member of club or organization: Not on  file    Attends meetings of clubs or organizations: Not on file    Relationship status: Not on file  Other Topics Concern  . Not on file  Social History Narrative  . Not on file    ECOG Status: 1 - Symptomatic but completely ambulatory  Review of Systems: A 12 point ROS discussed and pertinent positives are indicated in the HPI above.  All other systems are negative.  Review of Systems  Constitutional: Positive for fatigue.  Respiratory: Negative.   Cardiovascular: Negative.   Gastrointestinal: Negative.   Skin: Negative.   Hematological: Does not bruise/bleed easily.    Vital Signs: BP 118/83   Pulse (!) 57   Temp 98.2 F (36.8 C) (Oral)   Resp 18   Ht _0  (1.702 m)   Wt 155 lb (70.3 kg)   SpO2 99%   BMI 24.28 kg/m   Physical Exam  Constitutional: He appears well-developed and well-nourished.  HENT:  Head: Normocephalic and atraumatic.  Cardiovascular: Normal rate, regular rhythm and normal heart sounds.  Pulmonary/Chest: Effort normal and breath sounds normal.  Abdominal: Soft. Bowel sounds are normal.  Skin: Skin is warm and dry.  Psychiatric: He has a normal mood and affect. His behavior is normal. Thought content normal.  Nursing note and vitals reviewed.   Imaging: No results found.  Labs:  CBC: Recent Labs    08/01/17 1459 08/09/17 0849 10/31/17 1445 11/14/17 1537  WBC 7.2 6.9 4.9 4.9  HGB 13.2 14.0 14.2 13.5  HCT 39.3* 41.2 41.5 40.5  PLT 93* 87* 63* 61*    COAGS: Recent Labs    04/01/17 1518 11/15/17 0759  INR 0.92 1.03  APTT  --  34    BMP: Recent Labs    05/26/17 1454 05/28/17 0356 08/09/17 0849 10/31/17 1445  NA 139 139 144 140  K 3.7 4.4 4.3 3.5  CL 106 109 107* 109  CO2 _1 GLUCOSE 103* 118* 94 128*  BUN _2 CALCIUM 9.0 8.7* 9.3 9.0  CREATININE 0.89 1.06 0.84 0.73  GFRNONAA >60 >60 99 >60  GFRAA >60 >60 115 >60    LIVER FUNCTION TESTS: Recent Labs    04/10/17 1605 08/09/17 0849  10/31/17 1445  BILITOT 0.5 <0.2 0.3  AST _3 ALT _4 ALKPHOS 115 124* 104  PROT 7.5 6.2 6.6  ALBUMIN 4.3 4.1 4.1    TUMOR MARKERS: No results for input(s): AFPTM, CEA, CA199, CHROMGRNA in the last 8760 hours.  Assessment and Plan:  Donald Mendoza is a 57 y.o. male with past medical history significant for prostate cancer and hyperlipidemia who presents today to the radiology department for CT-guided bone marrow biopsy and aspiration secondary to thrombocytopenia.   Patient admits to his  baseline level of fatigue but is otherwise without complaint.  Risks and benefits of CT guided bone marrow biopsy and aspiration was discussed with the patient including, but not limited to bleeding, infection, damage to adjacent structures or low yield requiring additional tests.  All of the patient's questions were answered, patient is agreeable to proceed. Consent signed and in chart.  Thank you for this interesting consult.  I greatly enjoyed meeting Donald Mendoza and look forward to participating in their care.  A copy of this report was sent to the requesting provider on this date.  Electronically Signed: Sandi Mariscal, MD 11/15/2017, 8:38 AM   I spent a total of 15 Minutes in face to face in clinical consultation, greater than 50% of which was counseling/coordinating care for CT guided Bone Marrow Biopsy and aspiration.

## 2017-11-15 NOTE — Procedures (Signed)
Pre-procedure Diagnosis: Thrombocytopenia Post-procedure Diagnosis: Same  Technically successful CT guided bone marrow aspiration and biopsy of left iliac crest.   Complications: None Immediate  EBL: None  Signed: Sandi Mariscal Pager: [phone] 11/15/2017, 9:27 AM

## 2017-11-20 ENCOUNTER — Encounter (HOSPITAL_COMMUNITY): Payer: Self-pay | Admitting: Oncology

## 2017-11-25 ENCOUNTER — Encounter: Payer: Self-pay | Admitting: Oncology

## 2017-11-25 ENCOUNTER — Inpatient Hospital Stay: Payer: BLUE CROSS/BLUE SHIELD | Attending: Oncology | Admitting: Oncology

## 2017-11-25 ENCOUNTER — Other Ambulatory Visit: Payer: Self-pay

## 2017-11-25 ENCOUNTER — Inpatient Hospital Stay: Payer: BLUE CROSS/BLUE SHIELD

## 2017-11-25 VITALS — BP 98/64 | HR 76 | Temp 95.2°F | Wt 153.5 lb

## 2017-11-25 DIAGNOSIS — D696 Thrombocytopenia, unspecified: Secondary | ICD-10-CM | POA: Insufficient documentation

## 2017-11-25 DIAGNOSIS — R630 Anorexia: Secondary | ICD-10-CM | POA: Diagnosis not present

## 2017-11-25 DIAGNOSIS — Z720 Tobacco use: Secondary | ICD-10-CM | POA: Diagnosis not present

## 2017-11-25 DIAGNOSIS — C61 Malignant neoplasm of prostate: Secondary | ICD-10-CM | POA: Diagnosis not present

## 2017-11-25 DIAGNOSIS — R634 Abnormal weight loss: Secondary | ICD-10-CM | POA: Diagnosis not present

## 2017-11-25 DIAGNOSIS — K0889 Other specified disorders of teeth and supporting structures: Secondary | ICD-10-CM

## 2017-11-25 LAB — CBC WITH DIFFERENTIAL/PLATELET
BASOS ABS: 0 10*3/uL (ref 0–0.1)
BASOS PCT: 1 %
Eosinophils Absolute: 0.1 10*3/uL (ref 0–0.7)
Eosinophils Relative: 2 %
HEMATOCRIT: 42.7 % (ref 40.0–52.0)
HEMOGLOBIN: 14.5 g/dL (ref 13.0–18.0)
Lymphocytes Relative: 35 %
Lymphs Abs: 2 10*3/uL (ref 1.0–3.6)
MCH: 29.3 pg (ref 26.0–34.0)
MCHC: 33.9 g/dL (ref 32.0–36.0)
MCV: 86.6 fL (ref 80.0–100.0)
Monocytes Absolute: 0.4 10*3/uL (ref 0.2–1.0)
Monocytes Relative: 8 %
NEUTROS ABS: 3.1 10*3/uL (ref 1.4–6.5)
Neutrophils Relative %: 54 %
Platelets: 67 10*3/uL — ABNORMAL LOW (ref 150–400)
RBC: 4.93 MIL/uL (ref 4.40–5.90)
RDW: 15.6 % — AB (ref 11.5–14.5)
WBC: 5.6 10*3/uL (ref 3.8–10.6)

## 2017-11-25 MED ORDER — PREDNISONE 5 MG PO TABS: 10.0000 mg | ORAL_TABLET | Freq: Every day | ORAL | 0 refills | Status: DC

## 2017-11-25 MED ORDER — OMEPRAZOLE 20 MG PO CPDR: 20.0000 mg | DELAYED_RELEASE_CAPSULE | Freq: Every day | ORAL | 0 refills | Status: DC

## 2017-11-25 NOTE — Progress Notes (Signed)
Hematology/Oncology  Follow up note [facility] Telephone:(336) [phone] Fax:(336) [phone]   Patient Care Team: Ronnell Freshwater, NP as PCP - General (Family Medicine) Christie Nottingham, PA as Referring Physician (Physician Assistant) Christene Lye, MD (General Surgery)  PURPOSE OF CONSULTATION:  Follow up of management of thrombocytopenia.  HISTORY OF PRESENTING ILLNESS:  Donald Mendoza is a  57 y.o.  male with PMH listed below who was referred to me for evaluation of thrombocytopenia. Patient was recently diagnosed with intermediate risk prostate cancer in the follows up with Dr. Erlene Quan. Initially he was diagnosed with a low-risk prostate cancer Gleason score 3+3 in March 2018 when his PSA was 4. PSA continued to rise to 5.2 and patient had a repeat prostate biopsy revealing Gleason score 4+3. Patient was scheduled for radical prostatectomy however was found to have thrombocytopenia. Referred to [place] to evaluation for the low platelet count. Patient reports that he has been told that his platelet has been low in the past. He never received any treatment.  # S/p RALP with BPLND 05/27/2017, pathology showed Gleason 4+3, pT2 N0, NEGATIVE FOR EXTRAPROSTATIC EXTENSION, SEMINAL VESICLE INVASION, AND BLADDER NECK INVASION. - MARGINS NEGATIVE FOR CARCINOMA.  # Prostate cancer pT2N0, PSA <0.1, no adverse features, , no adjuvant treatment was offered.   INTERVAL HISTORY Patient presents to follow up for management of thrombocytopenia  During the interval he has had bone marrow biopsy present to discuss bone marrow biopsy results. Reports feeling tired, has had weight loss since surgery.  Since last visit 4 weeks ago, he has lost 2 pounds.  Appetite is poor. He also had dental issues that need to see dentist. Denies any acute bleeding events, easy bruising. Denies fever or chills.   Review of Systems  Constitutional: Positive for malaise/fatigue and weight loss.  Negative for chills and fever.  HENT: Negative for congestion, ear discharge, ear pain, hearing loss, nosebleeds, sinus pain, sore throat and tinnitus.   Eyes: Negative for blurred vision, double vision, photophobia, pain, discharge and redness.  Respiratory: Negative for cough, hemoptysis, sputum production, shortness of breath and wheezing.   Cardiovascular: Negative for chest pain, palpitations, orthopnea, claudication and leg swelling.  Gastrointestinal: Negative for abdominal pain, blood in stool, constipation, diarrhea, heartburn, melena, nausea and vomiting.  Genitourinary: Negative for dysuria, flank pain, frequency, hematuria and urgency.       Leaky, erection dysfunction.   Musculoskeletal: Negative for back pain, myalgias and neck pain.  Skin: Negative for itching and rash.  Neurological: Negative for dizziness, tingling, tremors, speech change, focal weakness, weakness and headaches.  Endo/Heme/Allergies: Negative for environmental allergies. Does not bruise/bleed easily.  Psychiatric/Behavioral: Negative for depression, hallucinations and substance abuse. The patient is not nervous/anxious.     MEDICAL HISTORY:  Past Medical History:  Diagnosis Date  . B12 deficiency 04/21/2017  . Cancer [facility]) 2018   Prostate Cancer  . Diverticulosis   . GERD (gastroesophageal reflux disease)   . Hypercholesteremia     SURGICAL HISTORY: Past Surgical History:  Procedure Laterality Date  . COLONOSCOPY    . HERNIA REPAIR     10 years ago, [place]. umbilical hernia x 2  . PELVIC LYMPH NODE DISSECTION Bilateral 05/27/2017   Procedure: PELVIC LYMPH NODE DISSECTION;  Surgeon: Hollice Espy, MD;  Location: [facility] ORS;  Service: Urology;  Laterality: Bilateral;  . ROBOT ASSISTED LAPAROSCOPIC RADICAL PROSTATECTOMY N/A 05/27/2017   Procedure: ROBOTIC ASSISTED LAPAROSCOPIC RADICAL PROSTATECTOMY;  Surgeon: Hollice Espy, MD;  Location: [facility] ORS;  Service: Urology;  Laterality: N/A;    SOCIAL  HISTORY: Social History   Socioeconomic History  . Marital status: Single    Spouse name: Not on file  . Number of children: Not on file  . Years of education: Not on file  . Highest education level: Not on file  Occupational History  . Not on file  Social Needs  . Financial resource strain: Not on file  . Food insecurity:    Worry: Not on file    Inability: Not on file  . Transportation needs:    Medical: Not on file    Non-medical: Not on file  Tobacco Use  . Smoking status: Current Some Day Smoker    Packs/day: 0.50    Years: 34.00    Pack years: 17.00    Types: Cigarettes  . Smokeless tobacco: Never Used  . Tobacco comment: restarted 1 week ago  Substance and Sexual Activity  . Alcohol use: Not Currently  . Drug use: No  . Sexual activity: Yes  Lifestyle  . Physical activity:    Days per week: Not on file    Minutes per session: Not on file  . Stress: Not on file  Relationships  . Social connections:    Talks on phone: Not on file    Gets together: Not on file    Attends religious service: Not on file    Active member of club or organization: Not on file    Attends meetings of clubs or organizations: Not on file    Relationship status: Not on file  . Intimate partner violence:    Fear of current or ex partner: Not on file    Emotionally abused: Not on file    Physically abused: Not on file    Forced sexual activity: Not on file  Other Topics Concern  . Not on file  Social History Narrative  . Not on file    FAMILY HISTORY: Family History  Problem Relation Age of Onset  . Diabetes Mother   . Breast cancer Mother   . Arthritis Father   . Breast cancer Sister     ALLERGIES:  is allergic to sulfa antibiotics.  MEDICATIONS:  Current Outpatient Medications  Medication Sig Dispense Refill  . pravastatin (PRAVACHOL) 40 MG tablet Take 1 tablet (40 mg total) by mouth daily. 30 tablet 6  . diazepam (VALIUM) 2 MG tablet Take 1 tablet (2 mg total) by mouth  every 8 (eight) hours as needed for muscle spasms. (Patient not taking: Reported on 11/25/2017) 15 tablet 0  . latanoprost (XALATAN) 0.005 % ophthalmic solution Place 1 drop at bedtime into both eyes.   0  . omeprazole (PRILOSEC) 20 MG capsule Take 1 capsule (20 mg total) by mouth daily. 30 capsule 0  . oxyCODONE-acetaminophen (PERCOCET/ROXICET) 5-325 MG tablet Take 1 tablet by mouth every 4 (four) hours as needed for severe pain. (Patient not taking: Reported on 11/25/2017) 15 tablet 0  . predniSONE (DELTASONE) 5 MG tablet Take 2 tablets (10 mg total) by mouth daily with breakfast. 28 tablet 0  . sildenafil (REVATIO) 20 MG tablet Take 1 tablet (20 mg total) by mouth as needed. Take 1-5 tabs as needed prior to intercourse (Patient not taking: Reported on 11/25/2017) 30 tablet 11   No current facility-administered medications for this visit.      PHYSICAL EXAMINATION: ECOG PERFORMANCE STATUS: 0 - Asymptomatic Vitals:   11/25/17 1437  BP: 98/64  Pulse: 76  Temp: (!) 95.2 F (  35.1 C)   Filed Weights   11/25/17 1437  Weight: 153 lb 8 oz (69.6 kg)    Physical Exam  Constitutional: He is oriented to person, place, and time and well-developed, well-nourished, and in no distress. No distress.  HENT:  Head: Normocephalic and atraumatic.  Nose: Nose normal.  Mouth/Throat: Oropharynx is clear and moist. No oropharyngeal exudate.  Eyes: Pupils are equal, round, and reactive to light. Conjunctivae and EOM are normal. Left eye exhibits no discharge. No scleral icterus.  Neck: Normal range of motion. Neck supple. No JVD present.  Cardiovascular: Normal rate, regular rhythm and normal heart sounds.  No murmur heard. Pulmonary/Chest: Effort normal and breath sounds normal. No respiratory distress. He has no wheezes. He has no rales. He exhibits no tenderness.  Abdominal: Soft. Bowel sounds are normal. He exhibits no distension and no mass. There is no tenderness. There is no rebound and no guarding.   Musculoskeletal: Normal range of motion. He exhibits no edema or tenderness.  Lymphadenopathy:    He has no cervical adenopathy.  Neurological: He is alert and oriented to person, place, and time. No cranial nerve deficit. He exhibits normal muscle tone. Coordination normal.  Skin: Skin is warm and dry. No rash noted. He is not diaphoretic. No erythema.  Psychiatric: Memory, affect and judgment normal.     LABORATORY DATA:  I have reviewed the data as listed Lab Results  Component Value Date   WBC 5.6 11/25/2017   HGB 14.5 11/25/2017   HCT 42.7 11/25/2017   MCV 86.6 11/25/2017   PLT 67 (L) 11/25/2017   Recent Labs    04/10/17 1605  05/28/17 0356 08/09/17 0849 10/31/17 1445  NA 142   < > 139 144 140  K 3.7   < > 4.4 4.3 3.5  CL 106   < > 109 107* 109  CO2 27   < > _0 GLUCOSE 112*   < > 118* 94 128*  BUN 13   < > _1 CREATININE 0.69   < > 1.06 0.84 0.73  CALCIUM 9.3   < > 8.7* 9.3 9.0  GFRNONAA >60   < > >60 99 >60  GFRAA >60   < > >60 115 >60  PROT 7.5  --   --  6.2 6.6  ALBUMIN 4.3  --   --  4.1 4.1  AST 23  --   --  14 20  ALT 18  --   --  12 20  ALKPHOS 115  --   --  124* 104  BILITOT 0.5  --   --  <0.2 0.3   < > = values in this interval not displayed.    RADIOGRAPHIC STUDIES: I have personally reviewed the radiological images as listed and agreed with the findings in the report. US Abdomen showed no liver parachyema changes or splenomegaly.    ASSESSMENT & PLAN:  1. Thrombocytopenia ([place])   2. Prostate cancer ([place])   3. Weight loss      # Chronic thrombocytopenia: 11/15/2017 bone marrow biopsy showed normocellular bone marrow for age with trilineage hematopoiesis.  Bone marrow has obtunded megakaryocytes with predominantly normal morphology.  Suggesting peripheral destruction, sequestration or consumption of platelets.  Bone marrow cytogenetics were normal. Discussed with patient about his bone marrow biopsy results.  His chronic  thrombocytopenia most likely secondary to ITP, possibly due to underlying autoimmune process.  Currently he has thrombocytopenia count does not trigger ITP treatment.  #  Weight loss and lack of appetite We will start patient on 10 mg of prednisone.  Follow-up in 2 weeks  #Tooth pain, advised patient to make an appointment with dentist.  If invasive procedures needed, dentist office consent me procedure clearance form and I will fill and fax back.  Return of visit: 2 weeks Total face to face encounter time for this patient visit was 25 min. >50% of the time was  spent in counseling and coordination of care.  Earlie Server, MD, PhD Hematology Oncology [facility] at [facility] Pager- [phone] 11/25/2017

## 2017-11-28 ENCOUNTER — Inpatient Hospital Stay: Payer: BLUE CROSS/BLUE SHIELD

## 2017-12-09 ENCOUNTER — Encounter: Payer: Self-pay | Admitting: Oncology

## 2017-12-09 ENCOUNTER — Other Ambulatory Visit: Payer: Self-pay

## 2017-12-09 ENCOUNTER — Inpatient Hospital Stay: Payer: BLUE CROSS/BLUE SHIELD

## 2017-12-09 ENCOUNTER — Inpatient Hospital Stay (HOSPITAL_BASED_OUTPATIENT_CLINIC_OR_DEPARTMENT_OTHER): Payer: BLUE CROSS/BLUE SHIELD | Admitting: Oncology

## 2017-12-09 VITALS — BP 97/57 | HR 65 | Temp 96.6°F | Resp 18 | Wt 154.2 lb

## 2017-12-09 DIAGNOSIS — C61 Malignant neoplasm of prostate: Secondary | ICD-10-CM | POA: Diagnosis not present

## 2017-12-09 DIAGNOSIS — R630 Anorexia: Secondary | ICD-10-CM

## 2017-12-09 DIAGNOSIS — D696 Thrombocytopenia, unspecified: Secondary | ICD-10-CM

## 2017-12-09 DIAGNOSIS — K0889 Other specified disorders of teeth and supporting structures: Secondary | ICD-10-CM | POA: Diagnosis not present

## 2017-12-09 DIAGNOSIS — R634 Abnormal weight loss: Secondary | ICD-10-CM

## 2017-12-09 DIAGNOSIS — Z720 Tobacco use: Secondary | ICD-10-CM | POA: Diagnosis not present

## 2017-12-09 LAB — CBC WITH DIFFERENTIAL/PLATELET
Basophils Absolute: 0.1 10*3/uL (ref 0–0.1)
Basophils Relative: 1 %
EOS ABS: 0 10*3/uL (ref 0–0.7)
EOS PCT: 0 %
HCT: 42.3 % (ref 40.0–52.0)
HEMOGLOBIN: 14.1 g/dL (ref 13.0–18.0)
LYMPHS ABS: 1.8 10*3/uL (ref 1.0–3.6)
LYMPHS PCT: 21 %
MCH: 29.2 pg (ref 26.0–34.0)
MCHC: 33.3 g/dL (ref 32.0–36.0)
MCV: 87.8 fL (ref 80.0–100.0)
MONOS PCT: 5 %
Monocytes Absolute: 0.4 10*3/uL (ref 0.2–1.0)
NEUTROS PCT: 73 %
Neutro Abs: 6.3 10*3/uL (ref 1.4–6.5)
Platelets: 74 10*3/uL — ABNORMAL LOW (ref 150–400)
RBC: 4.82 MIL/uL (ref 4.40–5.90)
RDW: 15.7 % — ABNORMAL HIGH (ref 11.5–14.5)
WBC: 8.6 10*3/uL (ref 3.8–10.6)

## 2017-12-09 NOTE — Progress Notes (Signed)
Hematology/Oncology  Follow up note [name] Telephone:(336) [phone] Fax:(336) [phone]   Patient Care Team: Ronnell Freshwater, NP as PCP - General (Family Medicine) Christie Nottingham, PA as Referring Physician (Physician Assistant) Christene Lye, MD (General Surgery)  PURPOSE OF CONSULTATION:  Follow up of management of thrombocytopenia.  HISTORY OF PRESENTING ILLNESS:  Donald Mendoza is a  57 y.o.  male with PMH listed below who was referred to me for evaluation of thrombocytopenia. Patient was recently diagnosed with intermediate risk prostate cancer in the follows up with Dr. Erlene Quan. Initially he was diagnosed with a low-risk prostate cancer Gleason score 3+3 in March 2018 when his PSA was 4. PSA continued to rise to 5.2 and patient had a repeat prostate biopsy revealing Gleason score 4+3. Patient was scheduled for radical prostatectomy however was found to have thrombocytopenia. Referred to [place] to evaluation for the low platelet count. Patient reports that he has been told that his platelet has been low in the past. He never received any treatment.  # S/p RALP with BPLND 05/27/2017, pathology showed Gleason 4+3, pT2 N0, NEGATIVE FOR EXTRAPROSTATIC EXTENSION, SEMINAL VESICLE INVASION, AND BLADDER NECK INVASION. - MARGINS NEGATIVE FOR CARCINOMA.  # Prostate cancer pT2N0, PSA <0.1, no adverse features, , no adjuvant treatment was offered.  # 11/15/2017 bone marrow biopsy showed normocellular bone marrow for age with trilineage hematopoiesis.  Bone marrow showed megakaryocytes with predominantly normal morphology.  Suggesting peripheral destruction, sequestration or consumption of platelets. Bone marrow cytogenetics were normal. Discussed with patient about his bone marrow biopsy results.  His chronic thrombocytopenia most likely secondary to ITP, possibly due to underlying autoimmune process.    INTERVAL HISTORY Patient presents to follow up for management of  thrombocytopenia, weight loss. During interval.  He has been started on low-dose prednisone 10 mg daily for active appetite stimulants. Patient reports feeling much better on prednisone.  Appetite is better and he has gained weight [1 pound] Denies any acute bleeding events. Reports tooth pain, chronic, and he he was previously told that he likely need invasive dental procedure including tooth extraction however this is being delayed due to thrombocytopenia..  Review of Systems  Constitutional: Positive for malaise/fatigue. Negative for chills, fever and weight loss.  HENT: Negative for congestion, ear discharge, ear pain, hearing loss, nosebleeds, sinus pain, sore throat and tinnitus.   Eyes: Negative for blurred vision, double vision, photophobia, pain, discharge and redness.  Respiratory: Negative for cough, hemoptysis, sputum production, shortness of breath and wheezing.   Cardiovascular: Negative for chest pain, palpitations, orthopnea, claudication and leg swelling.  Gastrointestinal: Negative for abdominal pain, blood in stool, constipation, diarrhea, heartburn, melena, nausea and vomiting.  Genitourinary: Negative for dysuria, flank pain, frequency, hematuria and urgency.       Leaky, erection dysfunction.   Musculoskeletal: Negative for back pain, myalgias and neck pain.  Skin: Negative for itching and rash.  Neurological: Negative for dizziness, tingling, tremors, speech change, focal weakness, weakness and headaches.  Endo/Heme/Allergies: Negative for environmental allergies. Does not bruise/bleed easily.  Psychiatric/Behavioral: Negative for depression, hallucinations and substance abuse. The patient is not nervous/anxious.     MEDICAL HISTORY:  Past Medical History:  Diagnosis Date  . B12 deficiency 04/21/2017  . Cancer [facility]) 2018   Prostate Cancer  . Diverticulosis   . GERD (gastroesophageal reflux disease)   . Hypercholesteremia     SURGICAL HISTORY: Past Surgical  History:  Procedure Laterality Date  . COLONOSCOPY    . HERNIA  REPAIR     10 years ago, [place]. umbilical hernia x 2  . PELVIC LYMPH NODE DISSECTION Bilateral 05/27/2017   Procedure: PELVIC LYMPH NODE DISSECTION;  Surgeon: Hollice Espy, MD;  Location: [facility] ORS;  Service: Urology;  Laterality: Bilateral;  . ROBOT ASSISTED LAPAROSCOPIC RADICAL PROSTATECTOMY N/A 05/27/2017   Procedure: ROBOTIC ASSISTED LAPAROSCOPIC RADICAL PROSTATECTOMY;  Surgeon: Hollice Espy, MD;  Location: [facility] ORS;  Service: Urology;  Laterality: N/A;    SOCIAL HISTORY: Social History   Socioeconomic History  . Marital status: Single    Spouse name: Not on file  . Number of children: Not on file  . Years of education: Not on file  . Highest education level: Not on file  Occupational History  . Not on file  Social Needs  . Financial resource strain: Not on file  . Food insecurity:    Worry: Not on file    Inability: Not on file  . Transportation needs:    Medical: Not on file    Non-medical: Not on file  Tobacco Use  . Smoking status: Current Some Day Smoker    Packs/day: 0.50    Years: 34.00    Pack years: 17.00    Types: Cigarettes  . Smokeless tobacco: Never Used  . Tobacco comment: restarted 1 week ago  Substance and Sexual Activity  . Alcohol use: Not Currently  . Drug use: No  . Sexual activity: Yes  Lifestyle  . Physical activity:    Days per week: Not on file    Minutes per session: Not on file  . Stress: Not on file  Relationships  . Social connections:    Talks on phone: Not on file    Gets together: Not on file    Attends religious service: Not on file    Active member of club or organization: Not on file    Attends meetings of clubs or organizations: Not on file    Relationship status: Not on file  . Intimate partner violence:    Fear of current or ex partner: Not on file    Emotionally abused: Not on file    Physically abused: Not on file    Forced sexual activity: Not on file    Other Topics Concern  . Not on file  Social History Narrative  . Not on file    FAMILY HISTORY: Family History  Problem Relation Age of Onset  . Diabetes Mother   . Breast cancer Mother   . Arthritis Father   . Breast cancer Sister     ALLERGIES:  is allergic to sulfa antibiotics.  MEDICATIONS:  Current Outpatient Medications  Medication Sig Dispense Refill  . diazepam (VALIUM) 2 MG tablet Take 1 tablet (2 mg total) by mouth every 8 (eight) hours as needed for muscle spasms. 15 tablet 0  . latanoprost (XALATAN) 0.005 % ophthalmic solution Place 1 drop at bedtime into both eyes.   0  . omeprazole (PRILOSEC) 20 MG capsule Take 1 capsule (20 mg total) by mouth daily. 30 capsule 0  . pravastatin (PRAVACHOL) 40 MG tablet Take 1 tablet (40 mg total) by mouth daily. 30 tablet 6  . predniSONE (DELTASONE) 5 MG tablet Take 2 tablets (10 mg total) by mouth daily with breakfast. 28 tablet 0  . oxyCODONE-acetaminophen (PERCOCET/ROXICET) 5-325 MG tablet Take 1 tablet by mouth every 4 (four) hours as needed for severe pain. (Patient not taking: Reported on 11/25/2017) 15 tablet 0  . sildenafil (REVATIO) 20 MG tablet Take  1 tablet (20 mg total) by mouth as needed. Take 1-5 tabs as needed prior to intercourse (Patient not taking: Reported on 11/25/2017) 30 tablet 11   No current facility-administered medications for this visit.      PHYSICAL EXAMINATION: ECOG PERFORMANCE STATUS: 0 - Asymptomatic Vitals:   12/09/17 1445  BP: (!) 97/57  Pulse: 65  Resp: 18  Temp: (!) 96.6 F (35.9 C)   Filed Weights   12/09/17 1445  Weight: 154 lb 3.2 oz (69.9 kg)    Physical Exam  Constitutional: He is oriented to person, place, and time and well-developed, well-nourished, and in no distress. No distress.  HENT:  Head: Normocephalic and atraumatic.  Nose: Nose normal.  Mouth/Throat: Oropharynx is clear and moist. No oropharyngeal exudate.  Eyes: Pupils are equal, round, and reactive to light.  Conjunctivae and EOM are normal. Left eye exhibits no discharge. No scleral icterus.  Neck: Normal range of motion. Neck supple. No JVD present.  Cardiovascular: Normal rate, regular rhythm and normal heart sounds.  No murmur heard. Pulmonary/Chest: Effort normal and breath sounds normal. No respiratory distress. He has no wheezes. He has no rales. He exhibits no tenderness.  Abdominal: Soft. Bowel sounds are normal. He exhibits no distension and no mass. There is no tenderness. There is no rebound and no guarding.  Musculoskeletal: Normal range of motion. He exhibits no edema or tenderness.  Lymphadenopathy:    He has no cervical adenopathy.  Neurological: He is alert and oriented to person, place, and time. No cranial nerve deficit. He exhibits normal muscle tone. Coordination normal.  Skin: Skin is warm and dry. No rash noted. He is not diaphoretic. No erythema.  Psychiatric: Memory, affect and judgment normal.     LABORATORY DATA:  I have reviewed the data as listed Lab Results  Component Value Date   WBC 8.6 12/09/2017   HGB 14.1 12/09/2017   HCT 42.3 12/09/2017   MCV 87.8 12/09/2017   PLT 74 (L) 12/09/2017   Recent Labs    04/10/17 1605  05/28/17 0356 08/09/17 0849 10/31/17 1445  NA 142   < > 139 144 140  K 3.7   < > 4.4 4.3 3.5  CL 106   < > 109 107* 109  CO2 27   < > 25 24 23   GLUCOSE 112*   < > 118* 94 128*  BUN 13   < > 12 9 15   CREATININE 0.69   < > 1.06 0.84 0.73  CALCIUM 9.3   < > 8.7* 9.3 9.0  GFRNONAA >60   < > >60 99 >60  GFRAA >60   < > >60 115 >60  PROT 7.5  --   --  6.2 6.6  ALBUMIN 4.3  --   --  4.1 4.1  AST 23  --   --  14 20  ALT 18  --   --  12 20  ALKPHOS 115  --   --  124* 104  BILITOT 0.5  --   --  <0.2 0.3   < > = values in this interval not displayed.    RADIOGRAPHIC STUDIES: I have personally reviewed the radiological images as listed and agreed with the findings in the report. US Abdomen showed no liver parachyema changes or  splenomegaly.    ASSESSMENT & PLAN:  1. Thrombocytopenia ([place])      # Chronic thrombocytopenia: Likely ITP, platelet counts stable, actually mildly improved secondary to low-dose prednisone that was started for  appetite stimulants. ANA is negative.  Currently his platelet counts are stable, does not need any ITP treatment for now.  I advise patient to make appointment with dentist. platelet counts above 50,000 should be ok for his invasive dental procedure. Advise local hemostatic measurements [absorbable hemostat or antifibrinolytic rinse: aminocaproic acid oral rinse]  #Weight loss and lack of appetite: improve with 10 mg prednisone.  Advise patient to taper down to prednisone 48m daily x one week and then use 565mevery other day for a week and then stop.  Called patient and above instruction discussed with him.  # Advise patient to obtain dental appointment and provide me dentist information. I will send a letter to his dentist.   Return of visit: 2 weeks Total face to face encounter time for this patient visit was 2544m >50% of the time was  spent in counseling and coordination of care.  ZhoEarlie ServerD, PhD Hematology Oncology [facility]- [phone]

## 2017-12-09 NOTE — Progress Notes (Signed)
Patient here for follow up. No concerns voiced.  °

## 2017-12-10 LAB — ANTINUCLEAR ANTIBODIES, IFA: ANTINUCLEAR ANTIBODIES, IFA: NEGATIVE

## 2017-12-10 MED ORDER — PREDNISONE 5 MG PO TABS: 5.0000 mg | ORAL_TABLET | Freq: Every day | ORAL | 0 refills | Status: DC

## 2017-12-13 ENCOUNTER — Encounter: Payer: Self-pay | Admitting: Nurse Practitioner

## 2017-12-13 ENCOUNTER — Ambulatory Visit: Payer: BLUE CROSS/BLUE SHIELD | Admitting: Nurse Practitioner

## 2017-12-13 VITALS — BP 118/85 | HR 90 | Resp 16 | Ht 67.0 in | Wt 156.4 lb

## 2017-12-13 DIAGNOSIS — E1165 Type 2 diabetes mellitus with hyperglycemia: Secondary | ICD-10-CM

## 2017-12-13 DIAGNOSIS — M542 Cervicalgia: Secondary | ICD-10-CM | POA: Diagnosis not present

## 2017-12-13 DIAGNOSIS — C61 Malignant neoplasm of prostate: Secondary | ICD-10-CM

## 2017-12-13 DIAGNOSIS — F17210 Nicotine dependence, cigarettes, uncomplicated: Secondary | ICD-10-CM

## 2017-12-13 LAB — POCT GLYCOSYLATED HEMOGLOBIN (HGB A1C): Hemoglobin A1C: 5.9 % — AB (ref 4.0–5.6)

## 2017-12-13 MED ORDER — NICOTINE 14 MG/24HR TD PT24
14.0000 mg | MEDICATED_PATCH | Freq: Every day | TRANSDERMAL | 1 refills | Status: DC
Start: 2017-12-13 — End: 2017-12-24

## 2017-12-13 MED ORDER — TRAMADOL HCL 50 MG PO TABS: 50.0000 mg | ORAL_TABLET | Freq: Three times a day (TID) | ORAL | 1 refills | Status: DC | PRN

## 2017-12-13 NOTE — Progress Notes (Signed)
[facility] [place], [place] [address]  Internal MEDICINE  Office Visit Note  Patient Name: Donald Mendoza  [id number]  [id number]  Date of Service: 12/29/2017  Chief Complaint  Patient presents with  . Diabetes    4 month follow up    Diabetes  He presents for his follow-up diabetic visit. He has type 2 diabetes mellitus. His disease course has been improving. There are no hypoglycemic associated symptoms. Pertinent negatives for hypoglycemia include no headaches or nervousness/anxiousness. Associated symptoms include fatigue. Pertinent negatives for diabetes include no chest pain, no polydipsia, no polyphagia, no polyuria and no weakness. There are no hypoglycemic complications. Symptoms are improving. There are no diabetic complications. Risk factors for coronary artery disease include dyslipidemia, diabetes mellitus and male sex. Current diabetic treatment includes diet. He is compliant with treatment all of the time. His weight is stable. He is following a generally healthy diet. Meal planning includes avoidance of concentrated sweets. He has not had a previous visit with a dietitian. He participates in exercise three times a week. There is no change in his home blood glucose trend. An ACE inhibitor/angiotensin II receptor blocker is not being taken.       Current Medication: Outpatient Encounter Medications as of 12/13/2017  Medication Sig Note  . pravastatin (PRAVACHOL) 40 MG tablet Take 1 tablet (40 mg total) by mouth daily.   . [DISCONTINUED] diazepam (VALIUM) 2 MG tablet Take 1 tablet (2 mg total) by mouth every 8 (eight) hours as needed for muscle spasms.   . [DISCONTINUED] latanoprost (XALATAN) 0.005 % ophthalmic solution Place 1 drop at bedtime into both eyes.  02/27/2016: Received from: External Pharmacy  . [DISCONTINUED] omeprazole (PRILOSEC) 20 MG capsule Take 1 capsule (20 mg total) by mouth daily.   . [DISCONTINUED] predniSONE (DELTASONE) 5 MG  tablet Take 1 tablet (5 mg total) by mouth daily with breakfast. (Patient not taking: Reported on 12/23/2017)   . sildenafil (REVATIO) 20 MG tablet Take 1 tablet (20 mg total) by mouth as needed. Take 1-5 tabs as needed prior to intercourse   . [DISCONTINUED] nicotine (NICODERM CQ) 14 mg/24hr patch Place 1 patch (14 mg total) onto the skin daily. (Patient not taking: Reported on 12/23/2017)   . [DISCONTINUED] oxyCODONE-acetaminophen (PERCOCET/ROXICET) 5-325 MG tablet Take 1 tablet by mouth every 4 (four) hours as needed for severe pain.   . [DISCONTINUED] traMADol (ULTRAM) 50 MG tablet Take 1 tablet (50 mg total) by mouth every 8 (eight) hours as needed. (Patient not taking: Reported on 12/23/2017)    No facility-administered encounter medications on file as of 12/13/2017.     Surgical History: Past Surgical History:  Procedure Laterality Date  . COLONOSCOPY    . HERNIA REPAIR     10 years ago, [place]. umbilical hernia x 2  . PELVIC LYMPH NODE DISSECTION Bilateral 05/27/2017   Procedure: PELVIC LYMPH NODE DISSECTION;  Surgeon: Hollice Espy, MD;  Location: [facility] ORS;  Service: Urology;  Laterality: Bilateral;  . ROBOT ASSISTED LAPAROSCOPIC RADICAL PROSTATECTOMY N/A 05/27/2017   Procedure: ROBOTIC ASSISTED LAPAROSCOPIC RADICAL PROSTATECTOMY;  Surgeon: Hollice Espy, MD;  Location: [facility] ORS;  Service: Urology;  Laterality: N/A;    Medical History: Past Medical History:  Diagnosis Date  . B12 deficiency 04/21/2017  . Cancer [facility]) 2018   Prostate Cancer  . Diverticulosis   . GERD (gastroesophageal reflux disease)   . Hypercholesteremia     Family History: Family History  Problem Relation Age of Onset  .  Diabetes Mother   . Breast cancer Mother   . Arthritis Father   . Breast cancer Sister     Social History   Socioeconomic History  . Marital status: Single    Spouse name: Not on file  . Number of children: Not on file  . Years of education: Not on file  . Highest education level: Not  on file  Occupational History  . Not on file  Social Needs  . Financial resource strain: Not on file  . Food insecurity:    Worry: Not on file    Inability: Not on file  . Transportation needs:    Medical: Not on file    Non-medical: Not on file  Tobacco Use  . Smoking status: Current Some Day Smoker    Packs/day: 0.50    Years: 34.00    Pack years: 17.00    Types: Cigarettes  . Smokeless tobacco: Never Used  . Tobacco comment: restarted 1 week ago  Substance and Sexual Activity  . Alcohol use: Not Currently  . Drug use: No  . Sexual activity: Yes  Lifestyle  . Physical activity:    Days per week: Not on file    Minutes per session: Not on file  . Stress: Not on file  Relationships  . Social connections:    Talks on phone: Not on file    Gets together: Not on file    Attends religious service: Not on file    Active member of club or organization: Not on file    Attends meetings of clubs or organizations: Not on file    Relationship status: Not on file  . Intimate partner violence:    Fear of current or ex partner: Not on file    Emotionally abused: Not on file    Physically abused: Not on file    Forced sexual activity: Not on file  Other Topics Concern  . Not on file  Social History Narrative  . Not on file      Review of Systems  Constitutional: Positive for fatigue. Negative for activity change, appetite change, chills and fever.  HENT: Negative for congestion, postnasal drip, rhinorrhea and sinus pain.   Eyes: Negative.   Respiratory: Negative for shortness of breath and wheezing.   Cardiovascular: Negative for chest pain and palpitations.  Gastrointestinal: Negative for constipation, diarrhea, nausea and vomiting.  Endocrine: Negative for cold intolerance, heat intolerance, polydipsia, polyphagia and polyuria.       Blood sugars doing well   Genitourinary: Positive for frequency and urgency.       Patient seeing urology for prostate cancer treatment   Musculoskeletal: Negative for arthralgias and back pain.  Skin: Negative for rash.  Allergic/Immunologic: Negative for environmental allergies.  Neurological: Negative for weakness and headaches.  Hematological: Negative for adenopathy.  Psychiatric/Behavioral: Negative for behavioral problems and dysphoric mood. The patient is not nervous/anxious.     Today's Vitals   12/13/17 1542  BP: 118/85  Pulse: 90  Resp: 16  SpO2: 97%  Weight: 156 lb 6.4 oz (70.9 kg)  Height: 5\' 7"  (1.702 m)   Physical Exam  Constitutional: He is oriented to person, place, and time. He appears well-developed and well-nourished. No distress.  HENT:  Head: Normocephalic and atraumatic.  Nose: Nose normal.  Mouth/Throat: Oropharynx is clear and moist. No oropharyngeal exudate.  Eyes: Pupils are equal, round, and reactive to light. Conjunctivae and EOM are normal.  Neck: Normal range of motion. Neck supple. No JVD  present. Carotid bruit is not present. No tracheal deviation present. No thyromegaly present.  Cardiovascular: Normal rate, regular rhythm and normal heart sounds. Exam reveals no gallop and no friction rub.  No murmur heard. Pulmonary/Chest: Effort normal and breath sounds normal. No respiratory distress. He has no wheezes. He has no rales. He exhibits no tenderness.  Abdominal: Soft. Bowel sounds are normal. There is no tenderness.  Musculoskeletal: Normal range of motion.  Lymphadenopathy:    He has no cervical adenopathy.  Neurological: He is alert and oriented to person, place, and time. No cranial nerve deficit.  Skin: Skin is warm and dry. Capillary refill takes less than 2 seconds. He is not diaphoretic.  Psychiatric: He has a normal mood and affect. His behavior is normal. Judgment and thought content normal.  Nursing note and vitals reviewed.  Assessment/Plan: 1. Uncontrolled type 2 diabetes mellitus with hyperglycemia (HCC) - POCT HgB A1C 5.9 today. Continue to monitor closely   2.  Cervicalgia Tramadol may be taken up to three times daily. A prescription for #15 tablets was sent to his pharmacy.   3. Cigarette nicotine dependence without complication Start nicoderm transdermal patch to help patient quit smoking.   4. Prostate cancer ([place]) Continue with regular visits to urology.   General Counseling: Slate verbalizes understanding of the findings of todays visit and agrees with plan of treatment. I have discussed any further diagnostic evaluation that may be needed or ordered today. We also reviewed his medications today. he has been encouraged to call the office with any questions or concerns that should arise related to todays visit.  This patient was seen by Leretha Pol FNP Collaboration with Dr Lavera Guise as a part of collaborative care agreement  Orders Placed This Encounter  Procedures  . POCT HgB A1C    Meds ordered this encounter  Medications  . DISCONTD: nicotine (NICODERM CQ) 14 mg/24hr patch    Sig: Place 1 patch (14 mg total) onto the skin daily.    Dispense:  28 patch    Refill:  1    Order Specific Question:   Supervising Provider    Answer:   Lavera Guise [[id number]]  . DISCONTD: traMADol (ULTRAM) 50 MG tablet    Sig: Take 1 tablet (50 mg total) by mouth every 8 (eight) hours as needed.    Dispense:  15 tablet    Refill:  1    Order Specific Question:   Supervising Provider    Answer:   Lavera Guise [[id number]]    Time spent: 62 Minutes      Dr Lavera Guise Internal medicine

## 2017-12-18 ENCOUNTER — Other Ambulatory Visit: Payer: BLUE CROSS/BLUE SHIELD

## 2017-12-18 DIAGNOSIS — R972 Elevated prostate specific antigen [PSA]: Secondary | ICD-10-CM

## 2017-12-19 LAB — PSA: Prostate Specific Ag, Serum: <0.1 ng/mL (ref 0.0–4.0)

## 2017-12-23 ENCOUNTER — Inpatient Hospital Stay: Payer: BLUE CROSS/BLUE SHIELD | Attending: Oncology | Admitting: Oncology

## 2017-12-23 ENCOUNTER — Encounter: Payer: Self-pay | Admitting: *Deleted

## 2017-12-23 ENCOUNTER — Encounter: Payer: Self-pay | Admitting: Oncology

## 2017-12-23 ENCOUNTER — Inpatient Hospital Stay: Payer: BLUE CROSS/BLUE SHIELD

## 2017-12-23 ENCOUNTER — Other Ambulatory Visit: Payer: Self-pay

## 2017-12-23 VITALS — BP 105/66 | HR 77 | Temp 96.5°F | Resp 18 | Wt 154.6 lb

## 2017-12-23 DIAGNOSIS — D696 Thrombocytopenia, unspecified: Secondary | ICD-10-CM

## 2017-12-23 DIAGNOSIS — C61 Malignant neoplasm of prostate: Secondary | ICD-10-CM | POA: Insufficient documentation

## 2017-12-23 DIAGNOSIS — Z720 Tobacco use: Secondary | ICD-10-CM | POA: Diagnosis not present

## 2017-12-23 LAB — CBC WITH DIFFERENTIAL/PLATELET
BASOS ABS: 0 10*3/uL (ref 0–0.1)
BASOS PCT: 1 %
EOS ABS: 0.1 10*3/uL (ref 0–0.7)
Eosinophils Relative: 2 %
HEMATOCRIT: 42.9 % (ref 40.0–52.0)
Hemoglobin: 14.1 g/dL (ref 13.0–18.0)
Lymphocytes Relative: 38 %
Lymphs Abs: 1.9 10*3/uL (ref 1.0–3.6)
MCH: 29.4 pg (ref 26.0–34.0)
MCHC: 32.9 g/dL (ref 32.0–36.0)
MCV: 89.4 fL (ref 80.0–100.0)
Monocytes Absolute: 0.4 10*3/uL (ref 0.2–1.0)
Monocytes Relative: 7 %
NEUTROS PCT: 52 %
Neutro Abs: 2.7 10*3/uL (ref 1.4–6.5)
Platelets: 71 10*3/uL — ABNORMAL LOW (ref 150–440)
RBC: 4.8 MIL/uL (ref 4.40–5.90)
RDW: 15.4 % — ABNORMAL HIGH (ref 11.5–14.5)
WBC: 5.1 10*3/uL (ref 3.8–10.6)
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
## 2017-12-24 ENCOUNTER — Ambulatory Visit (INDEPENDENT_AMBULATORY_CARE_PROVIDER_SITE_OTHER): Payer: BLUE CROSS/BLUE SHIELD | Admitting: Urology

## 2017-12-24 ENCOUNTER — Encounter: Payer: Self-pay | Admitting: Urology

## 2017-12-24 ENCOUNTER — Other Ambulatory Visit: Payer: Self-pay | Admitting: Oncology

## 2017-12-24 VITALS — BP 107/68 | HR 88 | Ht 67.0 in | Wt 164.0 lb

## 2017-12-24 DIAGNOSIS — N5231 Erectile dysfunction following radical prostatectomy: Secondary | ICD-10-CM

## 2017-12-24 DIAGNOSIS — C61 Malignant neoplasm of prostate: Secondary | ICD-10-CM | POA: Diagnosis not present

## 2017-12-24 DIAGNOSIS — N393 Stress incontinence (female) (male): Secondary | ICD-10-CM

## 2017-12-24 NOTE — Progress Notes (Signed)
12/24/2017 3:37 PM   Donald Mendoza 1960/07/16 [id number]  Referring provider: Ronnell Freshwater, NP [address] [place], [place] [address]  Chief Complaint  Patient presents with  . Prostate Cancer    41month    HPI: 57 year old male with history of prostate cancer status post prostatectomy who returns today for routine follow-up.  He is status post radical robotic prostatectomy on 05/27/2017.  He underwent a full nerve sparing procedure on the left and partial nerve sparing on the right.  Lymph node dissection was also performed.  Surgical pathology consistent with Gleason 4+3 prostate cancer, large volume of Gleason 4 component greater than 60% of the right nodule.  No extracapsular extension, seminal vesicle invasion, bladder neck invasion.  Margins were negative.  Lymph nodes negative but the left-sided lymph node dissection was somewhat limited due to presence of mesh.  Preop PSA 5.2 in 02/2007.  Postoperatively, has remained undetectable as of 12/18/2017.  He continues to leak, down to 1-2 diaper per day but these are not saturated.  He is doing pelvic floor exercises inconsistently.  He continues to see improvement.    He has been using sildenafil 80 mg with variable response, works sometimes but not others.   Overall seeing more function.     PMH: Past Medical History:  Diagnosis Date  . B12 deficiency 04/21/2017  . Cancer [facility]) 2018   Prostate Cancer  . Diverticulosis   . GERD (gastroesophageal reflux disease)   . Hypercholesteremia     Surgical History: Past Surgical History:  Procedure Laterality Date  . COLONOSCOPY    . HERNIA REPAIR     10 years ago, [place]. umbilical hernia x 2  . PELVIC LYMPH NODE DISSECTION Bilateral 05/27/2017   Procedure: PELVIC LYMPH NODE DISSECTION;  Surgeon: Hollice Espy, MD;  Location: [facility] ORS;  Service: Urology;  Laterality: Bilateral;  . ROBOT ASSISTED LAPAROSCOPIC RADICAL PROSTATECTOMY N/A 05/27/2017   Procedure: ROBOTIC  ASSISTED LAPAROSCOPIC RADICAL PROSTATECTOMY;  Surgeon: Hollice Espy, MD;  Location: [facility] ORS;  Service: Urology;  Laterality: N/A;    Home Medications:  Allergies as of 12/24/2017      Reactions   Sulfa Antibiotics Hives      Medication List        Accurate as of 12/24/17  3:37 PM. Always use your most recent med list.          pravastatin 40 MG tablet Commonly known as:  PRAVACHOL Take 1 tablet (40 mg total) by mouth daily.   sildenafil 20 MG tablet Commonly known as:  REVATIO Take 1 tablet (20 mg total) by mouth as needed. Take 1-5 tabs as needed prior to intercourse       Allergies:  Allergies  Allergen Reactions  . Sulfa Antibiotics Hives    Family History: Family History  Problem Relation Age of Onset  . Diabetes Mother   . Breast cancer Mother   . Arthritis Father   . Breast cancer Sister     Social History:  reports that he has been smoking cigarettes.  He has a 17.00 pack-year smoking history. He has never used smokeless tobacco. He reports that he drank alcohol. He reports that he does not use drugs.  ROS: UROLOGY Frequent Urination?: No Hard to postpone urination?: No Burning/pain with urination?: No Get up at night to urinate?: Yes Leakage of urine?: Yes Urine stream starts and stops?: No Trouble starting stream?: No Do you have to strain to urinate?: No Blood in urine?: No Urinary tract  infection?: No Sexually transmitted disease?: No Injury to kidneys or bladder?: No Painful intercourse?: No Weak stream?: No Erection problems?: No Penile pain?: No  Gastrointestinal Nausea?: No Vomiting?: No Indigestion/heartburn?: No Diarrhea?: No Constipation?: No  Constitutional Fever: No Night sweats?: No Weight loss?: No Fatigue?: No  Skin Skin rash/lesions?: No Itching?: No  Eyes Blurred vision?: No Double vision?: No  Ears/Nose/Throat Sore throat?: No Sinus problems?: No  Hematologic/Lymphatic Swollen glands?: No Easy  bruising?: No  Cardiovascular Leg swelling?: No Chest pain?: No  Respiratory Cough?: No Shortness of breath?: No  Endocrine Excessive thirst?: No  Musculoskeletal Back pain?: No Joint pain?: No  Neurological Headaches?: No Dizziness?: No  Psychologic Depression?: No Anxiety?: No  Physical Exam: BP 107/68   Pulse 88   Ht 5\' 7"  (1.702 m)   Wt 164 lb (74.4 kg)   BMI 25.69 kg/m   Constitutional:  Alert and oriented, No acute distress. HEENT: [place] AT, moist mucus membranes.  Trachea midline, no masses. Cardiovascular: No clubbing, cyanosis, or edema. Respiratory: Normal respiratory effort, no increased work of breathing. Skin: No rashes, bruises or suspicious lesions. Neurologic: Grossly intact, no focal deficits, moving all 4 extremities. Psychiatric: Normal mood and affect.  Laboratory Data: Lab Results  Component Value Date   WBC 5.1 12/23/2017   HGB 14.1 12/23/2017   HCT 42.9 12/23/2017   MCV 89.4 12/23/2017   PLT 71 (L) 12/23/2017    Lab Results  Component Value Date   CREATININE 0.73 10/31/2017    Lab Results  Component Value Date   HGBA1C 5.9 (A) 12/13/2017    Urinalysis n/a  Pertinent Imaging: N/a  Assessment & Plan:    1. Prostate cancer (HCC) NED Continue q6 month PSA  2. Stress incontinence, male Improving, anticipate conitnued improvement control over the next 6 months Discussed resuming pelvic floor on more regular basis  3. Erectile dysfunction after radical prostatectomy Advised to increase dose to 100 mg Optimal administration was also discussed Will call if he could like to pursue ICI  Return in about 1 year (around 12/25/2018) for PSA in 6 month lab only, 12 months PSA with MD.  Hollice Espy, MD  [facility] [address] [place], [place] [address] [phone]

## 2017-12-24 NOTE — Progress Notes (Signed)
Hematology/Oncology  Follow up note [facility] [phone] Fax:([phone]   Patient Care Team: Ronnell Freshwater, NP as PCP - General (Family Medicine) Christie Nottingham, PA as Referring Physician (Physician Assistant) Christene Lye, MD (General Surgery)  PURPOSE OF CONSULTATION:  Follow up of management of thrombocytopenia.  HISTORY OF PRESENTING ILLNESS:  Donald Mendoza is a  57 y.o.  male with PMH listed below who was referred to me for evaluation of thrombocytopenia. Patient was recently diagnosed with intermediate risk prostate cancer in the follows up with Dr. Erlene Quan. Initially he was diagnosed with a low-risk prostate cancer Gleason score 3+3 in March 2018 when his PSA was 4. PSA continued to rise to 5.2 and patient had a repeat prostate biopsy revealing Gleason score 4+3. Patient was scheduled for radical prostatectomy however was found to have thrombocytopenia. Referred to [place] to evaluation for the low platelet count. Patient reports that he has been told that his platelet has been low in the past. He never received any treatment.  # S/p RALP with BPLND 05/27/2017, pathology showed Gleason 4+3, pT2 N0, NEGATIVE FOR EXTRAPROSTATIC EXTENSION, SEMINAL VESICLE INVASION, AND BLADDER NECK INVASION. - MARGINS NEGATIVE FOR CARCINOMA.  # Prostate cancer pT2N0, PSA <0.1, no adverse features, , no adjuvant treatment was offered.  # 11/15/2017 bone marrow biopsy showed normocellular bone marrow for age with trilineage hematopoiesis.  Bone marrow showed megakaryocytes with predominantly normal morphology.  Suggesting peripheral destruction, sequestration or consumption of platelets. Bone marrow cytogenetics were normal. Discussed with patient about his bone marrow biopsy results.  His chronic thrombocytopenia most likely secondary to ITP, possibly due to underlying autoimmune process.    INTERVAL HISTORY Patient presents to follow up for management of  thrombocytopenia.   During interval , his prednisone has been tapered to 83m daily for a week and stopped 3 days ago.  Weight loss: appetite is better. Weight has been stable  Thrombocytopenia, denies any active bleeding event.   Review of Systems  Constitutional: Positive for malaise/fatigue. Negative for chills, fever and weight loss.  HENT: Negative for congestion, ear discharge, ear pain, hearing loss, nosebleeds, sinus pain, sore throat and tinnitus.   Eyes: Negative for blurred vision, double vision, photophobia, pain, discharge and redness.  Respiratory: Negative for cough, hemoptysis, sputum production, shortness of breath and wheezing.   Cardiovascular: Negative for chest pain, palpitations, orthopnea, claudication and leg swelling.  Gastrointestinal: Negative for abdominal pain, blood in stool, constipation, diarrhea, heartburn, melena, nausea and vomiting.  Genitourinary: Negative for dysuria, flank pain, frequency, hematuria and urgency.  Musculoskeletal: Negative for back pain, myalgias and neck pain.  Skin: Negative for itching and rash.  Neurological: Negative for dizziness, tingling, tremors, speech change, focal weakness, weakness and headaches.  Endo/Heme/Allergies: Negative for environmental allergies. Does not bruise/bleed easily.  Psychiatric/Behavioral: Negative for depression, hallucinations and substance abuse. The patient is not nervous/anxious.     MEDICAL HISTORY:  Past Medical History:  Diagnosis Date  . B12 deficiency 04/21/2017  . Cancer ([facility] 2018   Prostate Cancer  . Diverticulosis   . GERD (gastroesophageal reflux disease)   . Hypercholesteremia     SURGICAL HISTORY: Past Surgical History:  Procedure Laterality Date  . COLONOSCOPY    . HERNIA REPAIR     10 years ago, [place] umbilical hernia x 2  . PELVIC LYMPH NODE DISSECTION Bilateral 05/27/2017   Procedure: PELVIC LYMPH NODE DISSECTION;  Surgeon: BHollice Espy MD;  Location: [facility] ORS;  Service:  Urology;  Laterality: Bilateral;  . ROBOT ASSISTED LAPAROSCOPIC RADICAL PROSTATECTOMY N/A 05/27/2017   Procedure: ROBOTIC ASSISTED LAPAROSCOPIC RADICAL PROSTATECTOMY;  Surgeon: Hollice Espy, MD;  Location: [facility] ORS;  Service: Urology;  Laterality: N/A;    SOCIAL HISTORY: Social History   Socioeconomic History  . Marital status: Single    Spouse name: Not on file  . Number of children: Not on file  . Years of education: Not on file  . Highest education level: Not on file  Occupational History  . Not on file  Social Needs  . Financial resource strain: Not on file  . Food insecurity:    Worry: Not on file    Inability: Not on file  . Transportation needs:    Medical: Not on file    Non-medical: Not on file  Tobacco Use  . Smoking status: Current Some Day Smoker    Packs/day: 0.50    Years: 34.00    Pack years: 17.00    Types: Cigarettes  . Smokeless tobacco: Never Used  . Tobacco comment: restarted 1 week ago  Substance and Sexual Activity  . Alcohol use: Not Currently  . Drug use: No  . Sexual activity: Yes  Lifestyle  . Physical activity:    Days per week: Not on file    Minutes per session: Not on file  . Stress: Not on file  Relationships  . Social connections:    Talks on phone: Not on file    Gets together: Not on file    Attends religious service: Not on file    Active member of club or organization: Not on file    Attends meetings of clubs or organizations: Not on file    Relationship status: Not on file  . Intimate partner violence:    Fear of current or ex partner: Not on file    Emotionally abused: Not on file    Physically abused: Not on file    Forced sexual activity: Not on file  Other Topics Concern  . Not on file  Social History Narrative  . Not on file    FAMILY HISTORY: Family History  Problem Relation Age of Onset  . Diabetes Mother   . Breast cancer Mother   . Arthritis Father   . Breast cancer Sister     ALLERGIES:  is allergic  to sulfa antibiotics.  MEDICATIONS:  Current Outpatient Medications  Medication Sig Dispense Refill  . pravastatin (PRAVACHOL) 40 MG tablet Take 1 tablet (40 mg total) by mouth daily. 30 tablet 6  . sildenafil (REVATIO) 20 MG tablet Take 1 tablet (20 mg total) by mouth as needed. Take 1-5 tabs as needed prior to intercourse 30 tablet 11   No current facility-administered medications for this visit.      PHYSICAL EXAMINATION: ECOG PERFORMANCE STATUS: 0 - Asymptomatic Vitals:   12/23/17 1428  BP: 105/66  Pulse: 77  Resp: 18  Temp: (!) 96.5 F (35.8 C)   Filed Weights   12/23/17 1428  Weight: 154 lb 9.6 oz (70.1 kg)    Physical Exam  Constitutional: He is oriented to person, place, and time and well-developed, well-nourished, and in no distress. No distress.  HENT:  Head: Normocephalic and atraumatic.  Nose: Nose normal.  Mouth/Throat: Oropharynx is clear and moist. No oropharyngeal exudate.  Eyes: Pupils are equal, round, and reactive to light. Conjunctivae and EOM are normal. Left eye exhibits no discharge. No scleral icterus.  Neck: Normal range of motion. Neck supple. No JVD present.  Cardiovascular: Normal rate, regular rhythm and normal heart sounds.  No murmur heard. Pulmonary/Chest: Effort normal and breath sounds normal. No respiratory distress. He has no wheezes. He has no rales. He exhibits no tenderness.  Abdominal: Soft. Bowel sounds are normal. He exhibits no distension and no mass. There is no tenderness. There is no rebound and no guarding.  Musculoskeletal: Normal range of motion. He exhibits no edema or tenderness.  Lymphadenopathy:    He has no cervical adenopathy.  Neurological: He is alert and oriented to person, place, and time. No cranial nerve deficit. He exhibits normal muscle tone. Coordination normal.  Skin: Skin is warm and dry. No rash noted. He is not diaphoretic. No erythema.  Psychiatric: Memory, affect and judgment normal.     LABORATORY  DATA:  I have reviewed the data as listed Lab Results  Component Value Date   WBC 5.1 12/23/2017   HGB 14.1 12/23/2017   HCT 42.9 12/23/2017   MCV 89.4 12/23/2017   PLT 71 (L) 12/23/2017   Recent Labs    04/10/17 1605  05/28/17 0356 08/09/17 0849 10/31/17 1445  NA 142   < > 139 144 140  K 3.7   < > 4.4 4.3 3.5  CL 106   < > 109 107* 109  CO2 27   < > _0 GLUCOSE 112*   < > 118* 94 128*  BUN 13   < > _1 CREATININE 0.69   < > 1.06 0.84 0.73  CALCIUM 9.3   < > 8.7* 9.3 9.0  GFRNONAA >60   < > >60 99 >60  GFRAA >60   < > >60 115 >60  PROT 7.5  --   --  6.2 6.6  ALBUMIN 4.3  --   --  4.1 4.1  AST 23  --   --  14 20  ALT 18  --   --  12 20  ALKPHOS 115  --   --  124* 104  BILITOT 0.5  --   --  <0.2 0.3   < > = values in this interval not displayed.    RADIOGRAPHIC STUDIES: I have personally reviewed the radiological images as listed and agreed with the findings in the report. US Abdomen showed no liver parachyema changes or splenomegaly.    ASSESSMENT & PLAN:  1. Thrombocytopenia ([place])   2. Prostate cancer ([place])      # Chronic thrombocytopenia: Likely ITP,  Counts stable. Off steroids.  Advise patient to make appointment for dental evaluation. I will send a letter to his dentist.  Suggests CBC being done 2-3 days prior to dental procedure.  If platelet counts above 50,000, should be ok for invasive dental procedure.  Recommend local hemostatic measurements [absorbable hemostat or antifibrinolytic rinse: aminocaproic acid oral rinse].     Return of visit: 6 weeks.   Earlie Server, MD, PhD Hematology Oncology [facility] at [facility] Pager- [phone] 12/24/2017

## 2017-12-26 ENCOUNTER — Encounter: Payer: Self-pay | Admitting: Urology

## 2017-12-29 DIAGNOSIS — F17210 Nicotine dependence, cigarettes, uncomplicated: Secondary | ICD-10-CM | POA: Insufficient documentation

## 2017-12-29 DIAGNOSIS — M542 Cervicalgia: Secondary | ICD-10-CM | POA: Insufficient documentation

## 2017-12-29 DIAGNOSIS — E1165 Type 2 diabetes mellitus with hyperglycemia: Secondary | ICD-10-CM | POA: Insufficient documentation

## 2018-02-03 ENCOUNTER — Encounter: Payer: Self-pay | Admitting: Oncology

## 2018-02-03 ENCOUNTER — Other Ambulatory Visit: Payer: Self-pay

## 2018-02-03 ENCOUNTER — Inpatient Hospital Stay: Payer: BLUE CROSS/BLUE SHIELD | Attending: Oncology | Admitting: Oncology

## 2018-02-03 ENCOUNTER — Inpatient Hospital Stay: Payer: BLUE CROSS/BLUE SHIELD

## 2018-02-03 VITALS — BP 114/70 | HR 63 | Temp 98.1°F | Resp 18 | Wt 154.2 lb

## 2018-02-03 DIAGNOSIS — C61 Malignant neoplasm of prostate: Secondary | ICD-10-CM | POA: Diagnosis not present

## 2018-02-03 DIAGNOSIS — K0889 Other specified disorders of teeth and supporting structures: Secondary | ICD-10-CM

## 2018-02-03 DIAGNOSIS — D693 Immune thrombocytopenic purpura: Secondary | ICD-10-CM

## 2018-02-03 DIAGNOSIS — E538 Deficiency of other specified B group vitamins: Secondary | ICD-10-CM

## 2018-02-03 DIAGNOSIS — D696 Thrombocytopenia, unspecified: Secondary | ICD-10-CM

## 2018-02-03 DIAGNOSIS — Z720 Tobacco use: Secondary | ICD-10-CM | POA: Diagnosis not present

## 2018-02-03 LAB — CBC WITH DIFFERENTIAL/PLATELET
BASOS ABS: 0 10*3/uL (ref 0–0.1)
Basophils Relative: 0 %
EOS ABS: 0.1 10*3/uL (ref 0–0.7)
EOS PCT: 1 %
HCT: 41.4 % (ref 40.0–52.0)
Hemoglobin: 13.7 g/dL (ref 13.0–18.0)
Lymphocytes Relative: 32 %
Lymphs Abs: 1.9 10*3/uL (ref 1.0–3.6)
MCH: 29.5 pg (ref 26.0–34.0)
MCHC: 33.1 g/dL (ref 32.0–36.0)
MCV: 89.2 fL (ref 80.0–100.0)
Monocytes Absolute: 0.3 10*3/uL (ref 0.2–1.0)
Monocytes Relative: 5 %
Neutro Abs: 3.6 10*3/uL (ref 1.4–6.5)
Neutrophils Relative %: 62 %
PLATELETS: 64 10*3/uL — AB (ref 150–440)
RBC: 4.64 MIL/uL (ref 4.40–5.90)
RDW: 15.1 % — ABNORMAL HIGH (ref 11.5–14.5)
WBC: 5.9 10*3/uL (ref 3.8–10.6)

## 2018-02-03 MED ORDER — OMEPRAZOLE 20 MG PO CPDR: 20.0000 mg | DELAYED_RELEASE_CAPSULE | Freq: Every day | ORAL | 2 refills | Status: DC | PRN

## 2018-02-03 NOTE — Progress Notes (Signed)
Patient here for follow up. C/o frequent heartburn and would like to be restarted on prilosec as needed.

## 2018-02-04 NOTE — Progress Notes (Addendum)
Hematology/Oncology  Follow up note [facility] [phone] Fax:(336) [phone]   Patient Care Team: Ronnell Freshwater, NP as PCP - General (Family Medicine) Christie Nottingham, PA as Referring Physician (Physician Assistant) Christene Lye, MD (General Surgery)  PURPOSE OF CONSULTATION:  Follow up of management of thrombocytopenia.  HISTORY OF PRESENTING ILLNESS:  Donald Mendoza is a  57 y.o.  male with PMH listed below who was referred to me for evaluation of thrombocytopenia. Patient was recently diagnosed with intermediate risk prostate cancer in the follows up with Dr. Erlene Quan. Initially he was diagnosed with a low-risk prostate cancer Gleason score 3+3 in March 2018 when his PSA was 4. PSA continued to rise to 5.2 and patient had a repeat prostate biopsy revealing Gleason score 4+3. Patient was scheduled for radical prostatectomy however was found to have thrombocytopenia. Referred to [place] to evaluation for the low platelet count. Patient reports that he has been told that his platelet has been low in the past. He never received any treatment.  # S/p RALP with BPLND 05/27/2017, pathology showed Gleason 4+3, pT2 N0, NEGATIVE FOR EXTRAPROSTATIC EXTENSION, SEMINAL VESICLE INVASION, AND BLADDER NECK INVASION. - MARGINS NEGATIVE FOR CARCINOMA.  # Prostate cancer pT2N0, PSA <0.1, no adverse features, , no adjuvant treatment was offered.  # 11/15/2017 bone marrow biopsy showed normocellular bone marrow for age with trilineage hematopoiesis.  Bone marrow showed megakaryocytes with predominantly normal morphology.  Suggesting peripheral destruction, sequestration or consumption of platelets. Bone marrow cytogenetics were normal. Discussed with patient about his bone marrow biopsy results.  His chronic thrombocytopenia most likely secondary to ITP, possibly due to underlying autoimmune process.    INTERVAL HISTORY 57 y.o. male with history of prostate cancer s/p  prostectomy, ITP presents for follow up.   # Currently not on ITP treatment. No acute bleeding. Easy bruising # Prostate cancer. PSA was <0.1 on 12/18/2017. Denies any back pain, urinary symptoms.  # Weight loss, weight has been stable. Appetite is fair.  # Tooth pain, he has dental appointment next week.    Review of Systems  Constitutional: Negative for chills, fever, malaise/fatigue and weight loss.  HENT: Negative for congestion, ear discharge, ear pain, hearing loss, nosebleeds, sinus pain, sore throat and tinnitus.        Tooth pain  Eyes: Negative for blurred vision, double vision, photophobia, pain, discharge and redness.  Respiratory: Negative for cough, hemoptysis, sputum production, shortness of breath and wheezing.   Cardiovascular: Negative for chest pain, palpitations, orthopnea, claudication and leg swelling.  Gastrointestinal: Negative for abdominal pain, blood in stool, constipation, diarrhea, heartburn, melena, nausea and vomiting.  Genitourinary: Negative for dysuria, flank pain, frequency, hematuria and urgency.  Musculoskeletal: Negative for back pain, myalgias and neck pain.  Skin: Negative for itching and rash.  Neurological: Negative for dizziness, tingling, tremors, speech change, focal weakness, weakness and headaches.  Endo/Heme/Allergies: Negative for environmental allergies. Does not bruise/bleed easily.  Psychiatric/Behavioral: Negative for depression, hallucinations and substance abuse. The patient is not nervous/anxious.     MEDICAL HISTORY:  Past Medical History:  Diagnosis Date  . B12 deficiency 04/21/2017  . Cancer [facility]) 2018   Prostate Cancer  . Diverticulosis   . GERD (gastroesophageal reflux disease)   . Hypercholesteremia     SURGICAL HISTORY: Past Surgical History:  Procedure Laterality Date  . COLONOSCOPY    . HERNIA REPAIR     10 years ago, [place]. umbilical hernia x 2  . PELVIC LYMPH NODE DISSECTION  Bilateral 05/27/2017   Procedure:  PELVIC LYMPH NODE DISSECTION;  Surgeon: Hollice Espy, MD;  Location: [facility] ORS;  Service: Urology;  Laterality: Bilateral;  . ROBOT ASSISTED LAPAROSCOPIC RADICAL PROSTATECTOMY N/A 05/27/2017   Procedure: ROBOTIC ASSISTED LAPAROSCOPIC RADICAL PROSTATECTOMY;  Surgeon: Hollice Espy, MD;  Location: [facility] ORS;  Service: Urology;  Laterality: N/A;    SOCIAL HISTORY: Social History   Socioeconomic History  . Marital status: Single    Spouse name: Not on file  . Number of children: Not on file  . Years of education: Not on file  . Highest education level: Not on file  Occupational History  . Not on file  Social Needs  . Financial resource strain: Not on file  . Food insecurity:    Worry: Not on file    Inability: Not on file  . Transportation needs:    Medical: Not on file    Non-medical: Not on file  Tobacco Use  . Smoking status: Current Some Day Smoker    Packs/day: 0.50    Years: 34.00    Pack years: 17.00    Types: Cigarettes  . Smokeless tobacco: Never Used  . Tobacco comment: restarted 1 week ago  Substance and Sexual Activity  . Alcohol use: Not Currently  . Drug use: No  . Sexual activity: Yes  Lifestyle  . Physical activity:    Days per week: Not on file    Minutes per session: Not on file  . Stress: Not on file  Relationships  . Social connections:    Talks on phone: Not on file    Gets together: Not on file    Attends religious service: Not on file    Active member of club or organization: Not on file    Attends meetings of clubs or organizations: Not on file    Relationship status: Not on file  . Intimate partner violence:    Fear of current or ex partner: Not on file    Emotionally abused: Not on file    Physically abused: Not on file    Forced sexual activity: Not on file  Other Topics Concern  . Not on file  Social History Narrative  . Not on file    FAMILY HISTORY: Family History  Problem Relation Age of Onset  . Diabetes Mother   . Breast  cancer Mother   . Arthritis Father   . Breast cancer Sister     ALLERGIES:  is allergic to sulfa antibiotics.  MEDICATIONS:  Current Outpatient Medications  Medication Sig Dispense Refill  . pravastatin (PRAVACHOL) 40 MG tablet Take 1 tablet (40 mg total) by mouth daily. 30 tablet 6  . sildenafil (REVATIO) 20 MG tablet Take 1 tablet (20 mg total) by mouth as needed. Take 1-5 tabs as needed prior to intercourse 30 tablet 11  . omeprazole (PRILOSEC) 20 MG capsule Take 1 capsule (20 mg total) by mouth daily as needed. 30 capsule 2   No current facility-administered medications for this visit.      PHYSICAL EXAMINATION: ECOG PERFORMANCE STATUS: 0 - Asymptomatic Vitals:   02/03/18 1419  BP: 114/70  Pulse: 63  Resp: 18  Temp: 98.1 F (36.7 C)   Filed Weights   02/03/18 1419  Weight: 154 lb 3.2 oz (69.9 kg)    Physical Exam  Constitutional: He is oriented to person, place, and time. No distress.  HENT:  Head: Normocephalic and atraumatic.  Nose: Nose normal.  Mouth/Throat: Oropharynx is clear and moist. No  oropharyngeal exudate.  Eyes: Pupils are equal, round, and reactive to light. Conjunctivae and EOM are normal. Left eye exhibits no discharge. No scleral icterus.  Neck: Normal range of motion. Neck supple. No JVD present.  Cardiovascular: Normal rate, regular rhythm and normal heart sounds.  No murmur heard. Pulmonary/Chest: Effort normal and breath sounds normal. No respiratory distress. He has no wheezes. He has no rales. He exhibits no tenderness.  Abdominal: Soft. Bowel sounds are normal. He exhibits no distension and no mass. There is no tenderness. There is no rebound and no guarding.  Musculoskeletal: Normal range of motion. He exhibits no edema or tenderness.  Lymphadenopathy:    He has no cervical adenopathy.  Neurological: He is alert and oriented to person, place, and time. No cranial nerve deficit. He exhibits normal muscle tone. Coordination normal.  Skin:  Skin is warm and dry. No rash noted. He is not diaphoretic. No erythema.  Psychiatric: Memory, affect and judgment normal.     LABORATORY DATA:  I have reviewed the data as listed Lab Results  Component Value Date   WBC 5.9 02/03/2018   HGB 13.7 02/03/2018   HCT 41.4 02/03/2018   MCV 89.2 02/03/2018   PLT 64 (L) 02/03/2018   Recent Labs    04/10/17 1605  05/28/17 0356 08/09/17 0849 10/31/17 1445  NA 142   < > 139 144 140  K 3.7   < > 4.4 4.3 3.5  CL 106   < > 109 107* 109  CO2 27   < > 25 24 23   GLUCOSE 112*   < > 118* 94 128*  BUN 13   < > 12 9 15   CREATININE 0.69   < > 1.06 0.84 0.73  CALCIUM 9.3   < > 8.7* 9.3 9.0  GFRNONAA >60   < > >60 99 >60  GFRAA >60   < > >60 115 >60  PROT 7.5  --   --  6.2 6.6  ALBUMIN 4.3  --   --  4.1 4.1  AST 23  --   --  14 20  ALT 18  --   --  12 20  ALKPHOS 115  --   --  124* 104  BILITOT 0.5  --   --  <0.2 0.3   < > = values in this interval not displayed.    RADIOGRAPHIC STUDIES: I have personally reviewed the radiological images as listed and agreed with the findings in the report. US Abdomen showed no liver parachyema changes or splenomegaly.    ASSESSMENT & PLAN:  1. Thrombocytopenia ([place])   2. Prostate cancer ([place])   3. Chronic ITP (idiopathic thrombocytopenia) ([facility])   4. B12 deficiency      # Chronic thrombocytopenia: Likely ITP,  Labs was reviewed and discussed with patient.  Platelet counts continue to gradually decline.  Today's platelet count was 64,000.  Normal hemoglobin and hematocrit.  Normal WBC count and differential. He has had extensive work-up including peripheral lab work-up and bone marrow biopsy.  Consistent with peripheral destruction, such as ITP. Discussed with patient that if his counts decrease below 30,000 will start with steroid treatment. Check cbc monthly.  # History of B12 deficiency. Check B12 level.   #Tooth pain, suggest patient to make appointment for dental evaluation.  Suggest CBC being  done 2 to 3 days prior to dental procedure.  If platelet count is above 50,000, should be okay with invasive dental procedure.  Recommend local hemostatic measurements,[absorbable  hemostat or antifibrinolytic rinse: aminocaproic acid oral rinse]  #Prostate cancer, status post prostatectomy.  Last PSA less than 0.1 done in July 2019.  Repeat PSA at next visit.  Return of visit: 3 months. Total face to face encounter time for this patient visit was 25 min. >50% of the time was  spent in counseling and coordination of care.   Earlie Server, MD, PhD Hematology Oncology [facility] at [facility] Pager- [phone] 02/04/2018

## 2018-02-04 NOTE — Addendum Note (Signed)
Addended by: Earlie Server on: 02/04/2018 01:38 PM   Modules accepted: Orders

## 2018-02-18 DIAGNOSIS — H401123 Primary open-angle glaucoma, left eye, severe stage: Secondary | ICD-10-CM | POA: Diagnosis not present

## 2018-02-19 ENCOUNTER — Telehealth: Payer: Self-pay

## 2018-02-19 NOTE — Telephone Encounter (Signed)
Faxed completed medical clearance form to [facility]

## 2018-03-03 ENCOUNTER — Inpatient Hospital Stay: Payer: BLUE CROSS/BLUE SHIELD | Attending: Oncology

## 2018-03-31 ENCOUNTER — Inpatient Hospital Stay: Payer: BLUE CROSS/BLUE SHIELD | Attending: Oncology

## 2018-03-31 ENCOUNTER — Other Ambulatory Visit: Payer: Self-pay

## 2018-03-31 DIAGNOSIS — C61 Malignant neoplasm of prostate: Secondary | ICD-10-CM | POA: Diagnosis not present

## 2018-03-31 DIAGNOSIS — D696 Thrombocytopenia, unspecified: Secondary | ICD-10-CM

## 2018-03-31 LAB — CBC WITH DIFFERENTIAL/PLATELET
Abs Immature Granulocytes: 0.03 10*3/uL (ref 0.00–0.07)
BASOS ABS: 0 10*3/uL (ref 0.0–0.1)
Basophils Relative: 0 %
EOS ABS: 0.1 10*3/uL (ref 0.0–0.5)
EOS PCT: 2 %
HEMATOCRIT: 45.4 % (ref 39.0–52.0)
Hemoglobin: 14.7 g/dL (ref 13.0–17.0)
Immature Granulocytes: 0 %
LYMPHS ABS: 2.5 10*3/uL (ref 0.7–4.0)
Lymphocytes Relative: 35 %
MCH: 29.5 pg (ref 26.0–34.0)
MCHC: 32.4 g/dL (ref 30.0–36.0)
MCV: 91.2 fL (ref 80.0–100.0)
Monocytes Absolute: 0.5 10*3/uL (ref 0.1–1.0)
Monocytes Relative: 7 %
NRBC: 0 % (ref 0.0–0.2)
Neutro Abs: 4.1 10*3/uL (ref 1.7–7.7)
Neutrophils Relative %: 56 %
Platelets: 100 10*3/uL — ABNORMAL LOW (ref 150–400)
RBC: 4.98 MIL/uL (ref 4.22–5.81)
RDW: 14 % (ref 11.5–15.5)
WBC: 7.3 10*3/uL (ref 4.0–10.5)

## 2018-04-14 ENCOUNTER — Encounter: Payer: Self-pay | Admitting: Nurse Practitioner

## 2018-04-14 ENCOUNTER — Ambulatory Visit: Payer: BLUE CROSS/BLUE SHIELD | Admitting: Nurse Practitioner

## 2018-04-14 VITALS — BP 116/83 | HR 82 | Resp 16 | Ht 67.0 in | Wt 154.6 lb

## 2018-04-14 DIAGNOSIS — N390 Urinary tract infection, site not specified: Secondary | ICD-10-CM

## 2018-04-14 DIAGNOSIS — R300 Dysuria: Secondary | ICD-10-CM

## 2018-04-14 DIAGNOSIS — E7800 Pure hypercholesterolemia, unspecified: Secondary | ICD-10-CM

## 2018-04-14 DIAGNOSIS — E1165 Type 2 diabetes mellitus with hyperglycemia: Secondary | ICD-10-CM

## 2018-04-14 DIAGNOSIS — R319 Hematuria, unspecified: Secondary | ICD-10-CM

## 2018-04-14 DIAGNOSIS — E119 Type 2 diabetes mellitus without complications: Secondary | ICD-10-CM

## 2018-04-14 DIAGNOSIS — K219 Gastro-esophageal reflux disease without esophagitis: Secondary | ICD-10-CM

## 2018-04-14 LAB — POCT URINALYSIS DIPSTICK
Glucose, UA: NEGATIVE
Ketones, UA: NEGATIVE
Nitrite, UA: POSITIVE
Protein, UA: POSITIVE — AB
Spec Grav, UA: >=1.03 — AB (ref 1.010–1.025)
UROBILINOGEN UA: 1 U/dL
pH, UA: 6 (ref 5.0–8.0)

## 2018-04-14 LAB — POCT GLYCOSYLATED HEMOGLOBIN (HGB A1C): Hemoglobin A1C: 5.9 % — AB (ref 4.0–5.6)

## 2018-04-14 MED ORDER — PRAVASTATIN SODIUM 40 MG PO TABS: 40.0000 mg | ORAL_TABLET | Freq: Every day | ORAL | 5 refills | Status: DC

## 2018-04-14 MED ORDER — OMEPRAZOLE 20 MG PO CPDR: 20.0000 mg | DELAYED_RELEASE_CAPSULE | Freq: Every day | ORAL | 5 refills | Status: DC | PRN

## 2018-04-14 MED ORDER — NITROFURANTOIN MONOHYD MACRO 100 MG PO CAPS: 100.0000 mg | ORAL_CAPSULE | Freq: Two times a day (BID) | ORAL | 0 refills | Status: DC

## 2018-04-14 NOTE — Progress Notes (Signed)
[facility] [place], [place] [address]  Internal MEDICINE  Office Visit Note  Patient Name: Donald Mendoza  [id number]  [id number]  Date of Service: 04/19/2018  Chief Complaint  Patient presents with  . Medical Management of Chronic Issues    4 month follow up, pt is concerned about urine, it has a strong smell in the mornings and sometimes in the afternoon, pt mentioned he does not drink a lot of water. no itching or burning     The patient is c/o intermittent strong smell in his urine. Has been going on for several months. Started after he had surgery to remove his prostate due to prostate cancer. Was treated with antibiotics per urology, which did improve symptoms for a little while. But they returned a few weeks after treatment concluded. He has not mentioned the symptoms as he thought this might ne normal after having surgery to remove the prostate. He denies abdominal pain. He denies pain with urination or frequency of urination.  He does have diagnosis of diabetes which is controlled through diet alone. Sugars have been doing well and he has no complaints related to his blood sugars.       Current Medication: Outpatient Encounter Medications as of 04/14/2018  Medication Sig  . omeprazole (PRILOSEC) 20 MG capsule Take 1 capsule (20 mg total) by mouth daily as needed.  . pravastatin (PRAVACHOL) 40 MG tablet Take 1 tablet (40 mg total) by mouth daily.  . sildenafil (REVATIO) 20 MG tablet Take 1 tablet (20 mg total) by mouth as needed. Take 1-5 tabs as needed prior to intercourse  . [DISCONTINUED] omeprazole (PRILOSEC) 20 MG capsule Take 1 capsule (20 mg total) by mouth daily as needed.  . [DISCONTINUED] pravastatin (PRAVACHOL) 40 MG tablet Take 1 tablet (40 mg total) by mouth daily.  . nitrofurantoin, macrocrystal-monohydrate, (MACROBID) 100 MG capsule Take 1 capsule (100 mg total) by mouth 2 (two) times daily.   No facility-administered encounter  medications on file as of 04/14/2018.     Surgical History: Past Surgical History:  Procedure Laterality Date  . COLONOSCOPY    . HERNIA REPAIR     10 years ago, [place]. umbilical hernia x 2  . PELVIC LYMPH NODE DISSECTION Bilateral 05/27/2017   Procedure: PELVIC LYMPH NODE DISSECTION;  Surgeon: Hollice Espy, MD;  Location: [facility] ORS;  Service: Urology;  Laterality: Bilateral;  . ROBOT ASSISTED LAPAROSCOPIC RADICAL PROSTATECTOMY N/A 05/27/2017   Procedure: ROBOTIC ASSISTED LAPAROSCOPIC RADICAL PROSTATECTOMY;  Surgeon: Hollice Espy, MD;  Location: [facility] ORS;  Service: Urology;  Laterality: N/A;    Medical History: Past Medical History:  Diagnosis Date  . B12 deficiency 04/21/2017  . Cancer [facility]) 2018   Prostate Cancer  . Diverticulosis   . GERD (gastroesophageal reflux disease)   . Hypercholesteremia     Family History: Family History  Problem Relation Age of Onset  . Diabetes Mother   . Breast cancer Mother   . Arthritis Father   . Breast cancer Sister     Social History   Socioeconomic History  . Marital status: Single    Spouse name: Not on file  . Number of children: Not on file  . Years of education: Not on file  . Highest education level: Not on file  Occupational History  . Not on file  Social Needs  . Financial resource strain: Not on file  . Food insecurity:    Worry: Not on file    Inability: Not on  file  . Transportation needs:    Medical: Not on file    Non-medical: Not on file  Tobacco Use  . Smoking status: Current Some Day Smoker    Packs/day: 0.50    Years: 34.00    Pack years: 17.00    Types: Cigarettes  . Smokeless tobacco: Never Used  . Tobacco comment: restarted 1 week ago  Substance and Sexual Activity  . Alcohol use: Not Currently  . Drug use: No  . Sexual activity: Yes  Lifestyle  . Physical activity:    Days per week: Not on file    Minutes per session: Not on file  . Stress: Not on file  Relationships  . Social connections:     Talks on phone: Not on file    Gets together: Not on file    Attends religious service: Not on file    Active member of club or organization: Not on file    Attends meetings of clubs or organizations: Not on file    Relationship status: Not on file  . Intimate partner violence:    Fear of current or ex partner: Not on file    Emotionally abused: Not on file    Physically abused: Not on file    Forced sexual activity: Not on file  Other Topics Concern  . Not on file  Social History Narrative  . Not on file      Review of Systems  Constitutional: Negative for activity change, chills, fatigue and unexpected weight change.  HENT: Negative for congestion, postnasal drip, rhinorrhea, sneezing and sore throat.   Respiratory: Negative for cough, chest tightness, shortness of breath and wheezing.   Cardiovascular: Negative for chest pain and palpitations.  Gastrointestinal: Negative for abdominal pain, constipation, diarrhea, nausea and vomiting.  Endocrine: Negative for cold intolerance, heat intolerance, polydipsia and polyuria.  Genitourinary: Positive for frequency. Negative for dysuria, penile pain and urgency.       Intermittent strong odor to the urine. This is generally more severe in the afternoons.   Musculoskeletal: Negative for arthralgias, back pain, joint swelling and neck pain.  Skin: Negative for rash.  Allergic/Immunologic: Negative for environmental allergies.  Neurological: Negative.  Negative for tremors and numbness.  Hematological: Negative for adenopathy. Does not bruise/bleed easily.  Psychiatric/Behavioral: Negative for behavioral problems (Depression), sleep disturbance and suicidal ideas. The patient is not nervous/anxious.       Physical Exam  Constitutional: He is oriented to person, place, and time. He appears well-developed and well-nourished. No distress.  HENT:  Head: Normocephalic and atraumatic.  Mouth/Throat: No oropharyngeal exudate.  Eyes:  Pupils are equal, round, and reactive to light. EOM are normal.  Neck: Normal range of motion. Neck supple. No JVD present. No tracheal deviation present. No thyromegaly present.  Cardiovascular: Normal rate, regular rhythm and normal heart sounds. Exam reveals no gallop and no friction rub.  No murmur heard. Pulmonary/Chest: Effort normal and breath sounds normal. No respiratory distress. He has no wheezes. He has no rales. He exhibits no tenderness.  Abdominal: Soft. Bowel sounds are normal. There is no tenderness.  Genitourinary:  Genitourinary Comments: Urine sample is positive for moderate WBC. Also positive for protein and nitrites.   Musculoskeletal: Normal range of motion.  Lymphadenopathy:    He has no cervical adenopathy.  Neurological: He is alert and oriented to person, place, and time. No cranial nerve deficit.  Skin: Skin is warm and dry. He is not diaphoretic.  Psychiatric: He has a normal  mood and affect. His behavior is normal. Judgment and thought content normal.  Nursing note and vitals reviewed.  Assessment/Plan: 1. Urinary tract infection with hematuria, site unspecified Start macrobid 100mg  twice daily for 10 days. Send urine for culture and sensitivity and adjust antibiotics as indicated.  - CULTURE, URINE COMPREHENSIVE - nitrofurantoin, macrocrystal-monohydrate, (MACROBID) 100 MG capsule; Take 1 capsule (100 mg total) by mouth 2 (two) times daily.  Dispense: 20 capsule; Refill: 0  2. Type 2 diabetes mellitus without complication, without long-term current use of insulin (HCC) - POCT HgB A1C 5.9 today. Continue to control through diet and exercise.   3. Gastroesophageal reflux disease without esophagitis Start omeprazole 20mg  daily. Avoid triggers. May consider sleeping with the head of his bed elevated at 30 degrees to prevent nocturnal symptoms.  - omeprazole (PRILOSEC) 20 MG capsule; Take 1 capsule (20 mg total) by mouth daily as needed.  Dispense: 30 capsule;  Refill: 5  4. Hypercholesteremia Continue pravastatin as prescribed  - pravastatin (PRAVACHOL) 40 MG tablet; Take 1 tablet (40 mg total) by mouth daily.  Dispense: 30 tablet; Refill: 5  5. Dysuria - POCT Urinalysis Dipstick  General Counseling: Donald Mendoza verbalizes understanding of the findings of todays visit and agrees with plan of treatment. I have discussed any further diagnostic evaluation that may be needed or ordered today. We also reviewed his medications today. he has been encouraged to call the office with any questions or concerns that should arise related to todays visit.  Diabetes Counseling:  1. Addition of ACE inh/ ARB'S for nephroprotection. Microalbumin is updated  2. Diabetic foot care, prevention of complications. Podiatry consult 3. Exercise and lose weight.  4. Diabetic eye examination, Diabetic eye exam is updated  5. Monitor blood sugar closlely. nutrition counseling.  6. Sign and symptoms of hypoglycemia including shaking sweating,confusion and headaches.  This patient was seen by Leretha Pol FNP Collaboration with Dr Lavera Guise as a part of collaborative care agreement  Orders Placed This Encounter  Procedures  . CULTURE, URINE COMPREHENSIVE  . POCT Urinalysis Dipstick  . POCT HgB A1C    Meds ordered this encounter  Medications  . nitrofurantoin, macrocrystal-monohydrate, (MACROBID) 100 MG capsule    Sig: Take 1 capsule (100 mg total) by mouth 2 (two) times daily.    Dispense:  20 capsule    Refill:  0    Order Specific Question:   Supervising Provider    Answer:   Lavera Guise [[id number]]  . omeprazole (PRILOSEC) 20 MG capsule    Sig: Take 1 capsule (20 mg total) by mouth daily as needed.    Dispense:  30 capsule    Refill:  5    Order Specific Question:   Supervising Provider    Answer:   Lavera Guise [[id number]]  . pravastatin (PRAVACHOL) 40 MG tablet    Sig: Take 1 tablet (40 mg total) by mouth daily.    Dispense:  30 tablet    Refill:  5    Order  Specific Question:   Supervising Provider    Answer:   Lavera Guise [[id number]]    Time spent: 66 Minutes      Dr Lavera Guise Internal medicine

## 2018-04-17 LAB — CULTURE, URINE COMPREHENSIVE

## 2018-04-19 DIAGNOSIS — R319 Hematuria, unspecified: Secondary | ICD-10-CM

## 2018-04-19 DIAGNOSIS — N390 Urinary tract infection, site not specified: Secondary | ICD-10-CM | POA: Insufficient documentation

## 2018-04-19 DIAGNOSIS — K219 Gastro-esophageal reflux disease without esophagitis: Secondary | ICD-10-CM | POA: Insufficient documentation

## 2018-04-23 ENCOUNTER — Ambulatory Visit: Payer: BLUE CROSS/BLUE SHIELD | Admitting: Adult Health

## 2018-04-23 ENCOUNTER — Encounter: Payer: Self-pay | Admitting: Adult Health

## 2018-04-23 VITALS — BP 110/56 | HR 83 | Resp 16 | Ht 67.0 in | Wt 155.0 lb

## 2018-04-23 DIAGNOSIS — F17210 Nicotine dependence, cigarettes, uncomplicated: Secondary | ICD-10-CM | POA: Diagnosis not present

## 2018-04-23 DIAGNOSIS — R319 Hematuria, unspecified: Secondary | ICD-10-CM | POA: Diagnosis not present

## 2018-04-23 DIAGNOSIS — R300 Dysuria: Secondary | ICD-10-CM | POA: Diagnosis not present

## 2018-04-23 DIAGNOSIS — N390 Urinary tract infection, site not specified: Secondary | ICD-10-CM | POA: Diagnosis not present

## 2018-04-23 DIAGNOSIS — K219 Gastro-esophageal reflux disease without esophagitis: Secondary | ICD-10-CM

## 2018-04-23 LAB — POCT URINALYSIS DIPSTICK
BILIRUBIN UA: NEGATIVE
Blood, UA: NEGATIVE
GLUCOSE UA: NEGATIVE
KETONES UA: NEGATIVE
Leukocytes, UA: NEGATIVE
Nitrite, UA: NEGATIVE
Protein, UA: POSITIVE — AB
Urobilinogen, UA: 1 E.U./dL
pH, UA: 6 (ref 5.0–8.0)

## 2018-04-23 NOTE — Progress Notes (Signed)
[facility] [place], [place] [address]  Internal MEDICINE  Office Visit Note  Patient Name: Donald Mendoza  [id number]  [id number]  Date of Service: 04/23/2018  Chief Complaint  Patient presents with  . Urinary Tract Infection    recheck    HPI  PT is here for recheck for UTI.  Patient was treated for UTI approximately 2 weeks ago.  His urine culture came back as E. coli and then later as Klebsiella.  He was treated with Macrobid and has 2 days left of this treatment.  His urine dip today for follow-up is positive for protein however negative for leukocytes nitrates or blood.  Patient denies any further issues and verbalizes that he will finish his antibiotics.   Current Medication: Outpatient Encounter Medications as of 04/23/2018  Medication Sig  . nitrofurantoin, macrocrystal-monohydrate, (MACROBID) 100 MG capsule Take 1 capsule (100 mg total) by mouth 2 (two) times daily.  Marland Kitchen omeprazole (PRILOSEC) 20 MG capsule Take 1 capsule (20 mg total) by mouth daily as needed.  . pravastatin (PRAVACHOL) 40 MG tablet Take 1 tablet (40 mg total) by mouth daily.  . sildenafil (REVATIO) 20 MG tablet Take 1 tablet (20 mg total) by mouth as needed. Take 1-5 tabs as needed prior to intercourse   No facility-administered encounter medications on file as of 04/23/2018.     Surgical History: Past Surgical History:  Procedure Laterality Date  . COLONOSCOPY    . HERNIA REPAIR     10 years ago, [place]. umbilical hernia x 2  . PELVIC LYMPH NODE DISSECTION Bilateral 05/27/2017   Procedure: PELVIC LYMPH NODE DISSECTION;  Surgeon: Hollice Espy, MD;  Location: [facility] ORS;  Service: Urology;  Laterality: Bilateral;  . ROBOT ASSISTED LAPAROSCOPIC RADICAL PROSTATECTOMY N/A 05/27/2017   Procedure: ROBOTIC ASSISTED LAPAROSCOPIC RADICAL PROSTATECTOMY;  Surgeon: Hollice Espy, MD;  Location: [facility] ORS;  Service: Urology;  Laterality: N/A;    Medical History: Past Medical History:   Diagnosis Date  . B12 deficiency 04/21/2017  . Cancer [facility]) 2018   Prostate Cancer  . Diverticulosis   . GERD (gastroesophageal reflux disease)   . Hypercholesteremia     Family History: Family History  Problem Relation Age of Onset  . Diabetes Mother   . Breast cancer Mother   . Arthritis Father   . Breast cancer Sister     Social History   Socioeconomic History  . Marital status: Single    Spouse name: Not on file  . Number of children: Not on file  . Years of education: Not on file  . Highest education level: Not on file  Occupational History  . Not on file  Social Needs  . Financial resource strain: Not on file  . Food insecurity:    Worry: Not on file    Inability: Not on file  . Transportation needs:    Medical: Not on file    Non-medical: Not on file  Tobacco Use  . Smoking status: Current Some Day Smoker    Packs/day: 0.50    Years: 34.00    Pack years: 17.00    Types: Cigarettes  . Smokeless tobacco: Never Used  . Tobacco comment: restarted 1 week ago  Substance and Sexual Activity  . Alcohol use: Not Currently  . Drug use: No  . Sexual activity: Yes  Lifestyle  . Physical activity:    Days per week: Not on file    Minutes per session: Not on file  . Stress:  Not on file  Relationships  . Social connections:    Talks on phone: Not on file    Gets together: Not on file    Attends religious service: Not on file    Active member of club or organization: Not on file    Attends meetings of clubs or organizations: Not on file    Relationship status: Not on file  . Intimate partner violence:    Fear of current or ex partner: Not on file    Emotionally abused: Not on file    Physically abused: Not on file    Forced sexual activity: Not on file  Other Topics Concern  . Not on file  Social History Narrative  . Not on file      Review of Systems  Constitutional: Negative.  Negative for chills, fatigue and unexpected weight change.  HENT:  Negative.  Negative for congestion, rhinorrhea, sneezing and sore throat.   Eyes: Negative for redness.  Respiratory: Negative.  Negative for cough, chest tightness and shortness of breath.   Cardiovascular: Negative.  Negative for chest pain and palpitations.  Gastrointestinal: Negative.  Negative for abdominal pain, constipation, diarrhea, nausea and vomiting.  Endocrine: Negative.   Genitourinary: Negative.  Negative for dysuria and frequency.  Musculoskeletal: Negative.  Negative for arthralgias, back pain, joint swelling and neck pain.  Skin: Negative.  Negative for rash.  Allergic/Immunologic: Negative.   Neurological: Negative.  Negative for tremors and numbness.  Hematological: Negative for adenopathy. Does not bruise/bleed easily.  Psychiatric/Behavioral: Negative.  Negative for behavioral problems, sleep disturbance and suicidal ideas. The patient is not nervous/anxious.     Vital Signs: BP (!) 110/56 (BP Location: Left Arm, Patient Position: Sitting, Cuff Size: Normal)   Pulse 83   Resp 16   Ht 5\' 7"  (1.702 m)   Wt 155 lb (70.3 kg)   SpO2 99%   BMI 24.28 kg/m    Physical Exam  Constitutional: He is oriented to person, place, and time. He appears well-developed and well-nourished. No distress.  HENT:  Head: Normocephalic and atraumatic.  Mouth/Throat: Oropharynx is clear and moist. No oropharyngeal exudate.  Eyes: Pupils are equal, round, and reactive to light. EOM are normal.  Neck: Normal range of motion. Neck supple. No JVD present. No tracheal deviation present. No thyromegaly present.  Cardiovascular: Normal rate, regular rhythm and normal heart sounds. Exam reveals no gallop and no friction rub.  No murmur heard. Pulmonary/Chest: Effort normal and breath sounds normal. No respiratory distress. He has no wheezes. He has no rales. He exhibits no tenderness.  Abdominal: Soft. There is no tenderness. There is no guarding.  Musculoskeletal: Normal range of motion.   Lymphadenopathy:    He has no cervical adenopathy.  Neurological: He is alert and oriented to person, place, and time. No cranial nerve deficit.  Skin: Skin is warm and dry. He is not diaphoretic.  Psychiatric: He has a normal mood and affect. His behavior is normal. Judgment and thought content normal.  Nursing note and vitals reviewed.  Assessment/Plan: 1. Urinary tract infection with hematuria, site unspecified Appears to be resolved at this time.  Instructed patient to return to clinic if symptoms return.  We will follow-up with patient in 3 months or sooner if he calls and needs further treatment.  2. Gastroesophageal reflux disease without esophagitis Stable, continue current medications.  3. Cigarette nicotine dependence without complication Smoking cessation counseling: 1. Pt acknowledges the risks of long term smoking, she will try to quite  smoking. 2. Options for different medications including nicotine products, chewing gum, patch etc, Wellbutrin and Chantix is discussed 3. Goal and date of compete cessation is discussed 4. Total time spent in smoking cessation is 15 min.  4. Dysuria See flow sheet for results. - POCT Urinalysis Dipstick  General Counseling: Rahim verbalizes understanding of the findings of todays visit and agrees with plan of treatment. I have discussed any further diagnostic evaluation that may be needed or ordered today. We also reviewed his medications today. he has been encouraged to call the office with any questions or concerns that should arise related to todays visit.    Orders Placed This Encounter  Procedures  . POCT Urinalysis Dipstick    No orders of the defined types were placed in this encounter.   Time spent: 25 Minutes   This patient was seen by Orson Gear AGNP-C in Collaboration with Dr Lavera Guise as a part of collaborative care agreement     Kendell Bane AGNP-C Internal medicine

## 2018-04-23 NOTE — Patient Instructions (Signed)
Urinary Tract Infection, Adult °A urinary tract infection (UTI) is an infection of any part of the urinary tract. The urinary tract includes the: °· Kidneys. °· Ureters. °· Bladder. °· Urethra. ° °These organs make, store, and get rid of pee (urine) in the body. °Follow these instructions at home: °· Take over-the-counter and prescription medicines only as told by your doctor. °· If you were prescribed an antibiotic medicine, take it as told by your doctor. Do not stop taking the antibiotic even if you start to feel better. °· Avoid the following drinks: °? Alcohol. °? Caffeine. °? Tea. °? Carbonated drinks. °· Drink enough fluid to keep your pee clear or pale yellow. °· Keep all follow-up visits as told by your doctor. This is important. °· Make sure to: °? Empty your bladder often and completely. Do not to hold pee for long periods of time. °? Empty your bladder before and after sex. °? Wipe from front to back after a bowel movement if you are male. Use each tissue one time when you wipe. °Contact a doctor if: °· You have back pain. °· You have a fever. °· You feel sick to your stomach (nauseous). °· You throw up (vomit). °· Your symptoms do not get better after 3 days. °· Your symptoms go away and then come back. °Get help right away if: °· You have very bad back pain. °· You have very bad lower belly (abdominal) pain. °· You are throwing up and cannot keep down any medicines or water. °This information is not intended to replace advice given to you by your health care provider. Make sure you discuss any questions you have with your health care provider. °Document Released: 10/24/2007 Document Revised: 10/13/2015 Document Reviewed: 03/28/2015 °Elsevier Interactive Patient Education © 2018 [employer] ° °

## 2018-04-28 ENCOUNTER — Ambulatory Visit: Payer: Self-pay | Admitting: Nurse Practitioner

## 2018-05-05 ENCOUNTER — Other Ambulatory Visit: Payer: Self-pay

## 2018-05-05 ENCOUNTER — Encounter: Payer: Self-pay | Admitting: Oncology

## 2018-05-05 ENCOUNTER — Inpatient Hospital Stay: Payer: BLUE CROSS/BLUE SHIELD | Attending: Oncology

## 2018-05-05 ENCOUNTER — Inpatient Hospital Stay (HOSPITAL_BASED_OUTPATIENT_CLINIC_OR_DEPARTMENT_OTHER): Payer: BLUE CROSS/BLUE SHIELD | Admitting: Oncology

## 2018-05-05 VITALS — BP 107/69 | HR 97 | Temp 96.8°F | Resp 18 | Wt 153.4 lb

## 2018-05-05 DIAGNOSIS — D696 Thrombocytopenia, unspecified: Secondary | ICD-10-CM

## 2018-05-05 DIAGNOSIS — Z720 Tobacco use: Secondary | ICD-10-CM

## 2018-05-05 DIAGNOSIS — C61 Malignant neoplasm of prostate: Secondary | ICD-10-CM | POA: Diagnosis not present

## 2018-05-05 DIAGNOSIS — E538 Deficiency of other specified B group vitamins: Secondary | ICD-10-CM

## 2018-05-05 DIAGNOSIS — D693 Immune thrombocytopenic purpura: Secondary | ICD-10-CM

## 2018-05-05 LAB — COMPREHENSIVE METABOLIC PANEL
ALT: 20 U/L (ref 0–44)
AST: 19 U/L (ref 15–41)
Albumin: 4.1 g/dL (ref 3.5–5.0)
Alkaline Phosphatase: 118 U/L (ref 38–126)
Anion gap: 8 (ref 5–15)
BUN: 11 mg/dL (ref 6–20)
CALCIUM: 9.1 mg/dL (ref 8.9–10.3)
CO2: 28 mmol/L (ref 22–32)
Chloride: 104 mmol/L (ref 98–111)
Creatinine, Ser: 0.89 mg/dL (ref 0.61–1.24)
GFR calc Af Amer: >60 mL/min (ref >60)
GFR calc non Af Amer: >60 mL/min (ref >60)
Glucose, Bld: 163 mg/dL — ABNORMAL HIGH (ref 70–99)
Potassium: 3.6 mmol/L (ref 3.5–5.1)
Sodium: 140 mmol/L (ref 135–145)
Total Bilirubin: 0.3 mg/dL (ref 0.3–1.2)
Total Protein: 6.8 g/dL (ref 6.5–8.1)

## 2018-05-05 LAB — CBC WITH DIFFERENTIAL/PLATELET
Abs Immature Granulocytes: 0.02 10*3/uL (ref 0.00–0.07)
Basophils Absolute: 0 10*3/uL (ref 0.0–0.1)
Basophils Relative: 0 %
EOS PCT: 1 %
Eosinophils Absolute: 0.1 10*3/uL (ref 0.0–0.5)
HCT: 42.8 % (ref 39.0–52.0)
Hemoglobin: 14.2 g/dL (ref 13.0–17.0)
Immature Granulocytes: 0 %
Lymphocytes Relative: 30 %
Lymphs Abs: 2 10*3/uL (ref 0.7–4.0)
MCH: 29.3 pg (ref 26.0–34.0)
MCHC: 33.2 g/dL (ref 30.0–36.0)
MCV: 88.2 fL (ref 80.0–100.0)
Monocytes Absolute: 0.3 10*3/uL (ref 0.1–1.0)
Monocytes Relative: 5 %
Neutro Abs: 4.2 10*3/uL (ref 1.7–7.7)
Neutrophils Relative %: 64 %
Platelets: 102 10*3/uL — ABNORMAL LOW (ref 150–400)
RBC: 4.85 MIL/uL (ref 4.22–5.81)
RDW: 13.9 % (ref 11.5–15.5)
WBC: 6.6 10*3/uL (ref 4.0–10.5)
nRBC: 0 % (ref 0.0–0.2)

## 2018-05-05 LAB — VITAMIN B12: Vitamin B-12: 155 pg/mL — ABNORMAL LOW (ref 180–914)

## 2018-05-05 LAB — PSA: Prostatic Specific Antigen: <0.01 ng/mL (ref 0.00–4.00)

## 2018-05-05 NOTE — Progress Notes (Signed)
Patient here for follow up. He was recently treated with antibiotics for UTI. Problem resolved.

## 2018-05-07 NOTE — Progress Notes (Signed)
Hematology/Oncology  Follow up note [name] [phone] [phone]   Patient Care Team: Ronnell Freshwater, NP as PCP - General (Family Medicine) Christie Nottingham, PA as Referring Physician (Physician Assistant) Christene Lye, MD (General Surgery)  PURPOSE OF CONSULTATION:  Follow up of management of thrombocytopenia.  HISTORY OF PRESENTING ILLNESS:  Donald Mendoza is a  57 y.o.  male with PMH listed below who was referred to me for evaluation of thrombocytopenia. Patient was recently diagnosed with intermediate risk prostate cancer in the follows up with Dr. Erlene Quan. Initially he was diagnosed with a low-risk prostate cancer Gleason score 3+3 in March 2018 when his PSA was 4. PSA continued to rise to 5.2 and patient had a repeat prostate biopsy revealing Gleason score 4+3. Patient was scheduled for radical prostatectomy however was found to have thrombocytopenia. Referred to [place] to evaluation for the low platelet count. Patient reports that he has been told that his platelet has been low in the past. He never received any treatment.  # S/p RALP with BPLND 05/27/2017, pathology showed Gleason 4+3, pT2 N0, NEGATIVE FOR EXTRAPROSTATIC EXTENSION, SEMINAL VESICLE INVASION, AND BLADDER NECK INVASION. - MARGINS NEGATIVE FOR CARCINOMA.  # Prostate cancer pT2N0, PSA <0.1, no adverse features, , no adjuvant treatment was offered.  # 11/15/2017 bone marrow biopsy showed normocellular bone marrow for age with trilineage hematopoiesis.  Bone marrow showed megakaryocytes with predominantly normal morphology.  Suggesting peripheral destruction, sequestration or consumption of platelets. Bone marrow cytogenetics were normal. Discussed with patient about his bone marrow biopsy results.  His chronic thrombocytopenia most likely secondary to ITP, possibly due to underlying autoimmune process.    INTERVAL HISTORY 57 y.o. male with history of prostate cancer s/p  prostectomy, ITP presents for follow-up.  #Chronic ITP, denies any acute bleeding or easy bruising. #Prostate cancer, PSA less than 0.01 on 05/05/2018.  Denies any back pain, urinary symptoms. He did have a recent UTI which was treated with antibiotics.  Symptoms resolved.  Review of Systems  Constitutional: Negative for chills, fever, malaise/fatigue and weight loss.  HENT: Negative for sore throat.   Eyes: Negative for redness.  Respiratory: Negative for cough, shortness of breath and wheezing.   Cardiovascular: Negative for chest pain, palpitations and leg swelling.  Gastrointestinal: Negative for abdominal pain, blood in stool, nausea and vomiting.  Genitourinary: Negative for dysuria.  Musculoskeletal: Negative for myalgias.  Skin: Negative for rash.  Neurological: Negative for dizziness, tingling and tremors.  Endo/Heme/Allergies: Does not bruise/bleed easily.  Psychiatric/Behavioral: Negative for hallucinations.    MEDICAL HISTORY:  Past Medical History:  Diagnosis Date  . B12 deficiency 04/21/2017  . Cancer [facility]) 2018   Prostate Cancer  . Diverticulosis   . GERD (gastroesophageal reflux disease)   . Hypercholesteremia     SURGICAL HISTORY: Past Surgical History:  Procedure Laterality Date  . COLONOSCOPY    . HERNIA REPAIR     10 years ago, [place]. umbilical hernia x 2  . PELVIC LYMPH NODE DISSECTION Bilateral 05/27/2017   Procedure: PELVIC LYMPH NODE DISSECTION;  Surgeon: Hollice Espy, MD;  Location: [facility] ORS;  Service: Urology;  Laterality: Bilateral;  . ROBOT ASSISTED LAPAROSCOPIC RADICAL PROSTATECTOMY N/A 05/27/2017   Procedure: ROBOTIC ASSISTED LAPAROSCOPIC RADICAL PROSTATECTOMY;  Surgeon: Hollice Espy, MD;  Location: [facility] ORS;  Service: Urology;  Laterality: N/A;    SOCIAL HISTORY: Social History   Socioeconomic History  . Marital status: Single    Spouse name: Not on file  .  Number of children: Not on file  . Years of education: Not on file  . Highest  education level: Not on file  Occupational History  . Not on file  Social Needs  . Financial resource strain: Not on file  . Food insecurity:    Worry: Not on file    Inability: Not on file  . Transportation needs:    Medical: Not on file    Non-medical: Not on file  Tobacco Use  . Smoking status: Current Some Day Smoker    Packs/day: 0.50    Years: 34.00    Pack years: 17.00    Types: Cigarettes  . Smokeless tobacco: Never Used  . Tobacco comment: restarted 1 week ago  Substance and Sexual Activity  . Alcohol use: Not Currently  . Drug use: No  . Sexual activity: Yes  Lifestyle  . Physical activity:    Days per week: Not on file    Minutes per session: Not on file  . Stress: Not on file  Relationships  . Social connections:    Talks on phone: Not on file    Gets together: Not on file    Attends religious service: Not on file    Active member of club or organization: Not on file    Attends meetings of clubs or organizations: Not on file    Relationship status: Not on file  . Intimate partner violence:    Fear of current or ex partner: Not on file    Emotionally abused: Not on file    Physically abused: Not on file    Forced sexual activity: Not on file  Other Topics Concern  . Not on file  Social History Narrative  . Not on file    FAMILY HISTORY: Family History  Problem Relation Age of Onset  . Diabetes Mother   . Breast cancer Mother   . Arthritis Father   . Breast cancer Sister     ALLERGIES:  is allergic to sulfa antibiotics.  MEDICATIONS:  Current Outpatient Medications  Medication Sig Dispense Refill  . latanoprost (XALATAN) 0.005 % ophthalmic solution INT 1 GTT IN OU 1 TIME IN THE EVE  0  . omeprazole (PRILOSEC) 20 MG capsule Take 1 capsule (20 mg total) by mouth daily as needed. 30 capsule 5  . pravastatin (PRAVACHOL) 40 MG tablet Take 1 tablet (40 mg total) by mouth daily. 30 tablet 5  . sildenafil (REVATIO) 20 MG tablet Take 1 tablet (20 mg  total) by mouth as needed. Take 1-5 tabs as needed prior to intercourse 30 tablet 11   No current facility-administered medications for this visit.      PHYSICAL EXAMINATION: ECOG PERFORMANCE STATUS: 0 - Asymptomatic Vitals:   05/05/18 1500  BP: 107/69  Pulse: 97  Resp: 18  Temp: (!) 96.8 F (36 C)   Filed Weights   05/05/18 1500  Weight: 153 lb 6.4 oz (69.6 kg)    Physical Exam  Constitutional: He is oriented to person, place, and time. No distress.  HENT:  Head: Normocephalic and atraumatic.  Nose: Nose normal.  Mouth/Throat: Oropharynx is clear and moist. No oropharyngeal exudate.  Eyes: Pupils are equal, round, and reactive to light. Conjunctivae and EOM are normal. Left eye exhibits no discharge. No scleral icterus.  Neck: Normal range of motion. Neck supple. No JVD present.  Cardiovascular: Normal rate, regular rhythm and normal heart sounds.  No murmur heard. Pulmonary/Chest: Effort normal and breath sounds normal. No respiratory distress. He  has no wheezes. He has no rales. He exhibits no tenderness.  Abdominal: Soft. Bowel sounds are normal. He exhibits no distension and no mass. There is no abdominal tenderness. There is no rebound and no guarding.  Musculoskeletal: Normal range of motion.        General: No tenderness or edema.  Lymphadenopathy:    He has no cervical adenopathy.  Neurological: He is alert and oriented to person, place, and time. No cranial nerve deficit. He exhibits normal muscle tone. Coordination normal.  Skin: Skin is warm and dry. No rash noted. He is not diaphoretic. No erythema.  Psychiatric: Memory, affect and judgment normal.     LABORATORY DATA:  I have reviewed the data as listed Lab Results  Component Value Date   WBC 6.6 05/05/2018   HGB 14.2 05/05/2018   HCT 42.8 05/05/2018   MCV 88.2 05/05/2018   PLT 102 (L) 05/05/2018   Recent Labs    08/09/17 0849 10/31/17 1445 05/05/18 1425  NA 144 140 140  K 4.3 3.5 3.6  CL 107*  109 104  CO2 24 23 28   GLUCOSE 94 128* 163*  BUN 9 15 11   CREATININE 0.84 0.73 0.89  CALCIUM 9.3 9.0 9.1  GFRNONAA 99 >60 >60  GFRAA 115 >60 >60  PROT 6.2 6.6 6.8  ALBUMIN 4.1 4.1 4.1  AST 14 20 19   ALT 12 20 20   ALKPHOS 124* 104 118  BILITOT <0.2 0.3 0.3    RADIOGRAPHIC STUDIES: I have personally reviewed the radiological images as listed and agreed with the findings in the report. US Abdomen showed no liver parachyema changes or splenomegaly.    ASSESSMENT & PLAN:  1. Thrombocytopenia ([place])   2. Prostate cancer ([place])   3. Chronic ITP (idiopathic thrombocytopenia) ([facility])   4. B12 deficiency      # Chronic thrombocytopenia: Likely ITP,  Lab was reviewed and discussed with patient.  Platelet counts has improved during interval to be above 100,000.  Normal hemoglobin and hematocrit.  Normal white blood cell counts Continue observation.  #Prostate cancer, pT2(m)  pN0. PSA 5.2, gleason score 4+3, grade group 3--Stage IIC, unfavorable intermediate risk group,  status post prostatectomy BPLND .  PSA less than 0.01. continue follow up and lab monitoring PSA Q6 months.   # History of vitamin B12 deficiency, B12 decreased at 155,  Plan proceed with vitamin b12 injections daily x 5 days followed by weekly x 4 Check intrinsic factor antibody and anti parietal antibody  Return of visit: 6 months.  Total face to face encounter time for this patient visit was 25 min. >50% of the time was  spent in counseling and coordination of care.   Earlie Server, MD, PhD Hematology Oncology [facility] Pager- [phone] 05/07/2018

## 2018-05-26 ENCOUNTER — Inpatient Hospital Stay: Payer: BLUE CROSS/BLUE SHIELD | Attending: Oncology

## 2018-05-26 DIAGNOSIS — E538 Deficiency of other specified B group vitamins: Secondary | ICD-10-CM | POA: Diagnosis not present

## 2018-05-26 MED ORDER — CYANOCOBALAMIN 1000 MCG/ML IJ SOLN
1000.0000 ug | Freq: Once | INTRAMUSCULAR | Status: AC
  Administered 2018-05-26: 1000 ug via INTRAMUSCULAR

## 2018-05-27 ENCOUNTER — Inpatient Hospital Stay: Payer: BLUE CROSS/BLUE SHIELD

## 2018-05-27 DIAGNOSIS — E538 Deficiency of other specified B group vitamins: Secondary | ICD-10-CM | POA: Diagnosis not present

## 2018-05-27 MED ORDER — CYANOCOBALAMIN 1000 MCG/ML IJ SOLN
1000.0000 ug | Freq: Once | INTRAMUSCULAR | Status: AC
  Administered 2018-05-27: 1000 ug via INTRAMUSCULAR

## 2018-05-28 ENCOUNTER — Inpatient Hospital Stay: Payer: BLUE CROSS/BLUE SHIELD

## 2018-05-28 DIAGNOSIS — E538 Deficiency of other specified B group vitamins: Secondary | ICD-10-CM

## 2018-05-28 MED ORDER — CYANOCOBALAMIN 1000 MCG/ML IJ SOLN
1000.0000 ug | Freq: Once | INTRAMUSCULAR | Status: AC
  Administered 2018-05-28: 1000 ug via INTRAMUSCULAR

## 2018-05-29 ENCOUNTER — Inpatient Hospital Stay: Payer: BLUE CROSS/BLUE SHIELD

## 2018-05-29 DIAGNOSIS — E538 Deficiency of other specified B group vitamins: Secondary | ICD-10-CM | POA: Diagnosis not present

## 2018-05-29 MED ORDER — CYANOCOBALAMIN 1000 MCG/ML IJ SOLN
1000.0000 ug | Freq: Once | INTRAMUSCULAR | Status: AC
  Administered 2018-05-29: 1000 ug via INTRAMUSCULAR

## 2018-05-30 ENCOUNTER — Inpatient Hospital Stay: Payer: BLUE CROSS/BLUE SHIELD

## 2018-05-30 DIAGNOSIS — E538 Deficiency of other specified B group vitamins: Secondary | ICD-10-CM

## 2018-05-30 MED ORDER — CYANOCOBALAMIN 1000 MCG/ML IJ SOLN
1000.0000 ug | Freq: Once | INTRAMUSCULAR | Status: AC
  Administered 2018-05-30: 1000 ug via INTRAMUSCULAR

## 2018-06-06 ENCOUNTER — Inpatient Hospital Stay: Payer: BLUE CROSS/BLUE SHIELD

## 2018-06-06 DIAGNOSIS — E538 Deficiency of other specified B group vitamins: Secondary | ICD-10-CM | POA: Diagnosis not present

## 2018-06-06 MED ORDER — CYANOCOBALAMIN 1000 MCG/ML IJ SOLN
1000.0000 ug | Freq: Once | INTRAMUSCULAR | Status: AC
  Administered 2018-06-06: 1000 ug via INTRAMUSCULAR

## 2018-06-10 IMAGING — CT CT MAXILLOFACIAL W/O CM
3 series · 15 of 47 positions shown, 18 images · non-contrast
Comparison: None

CLINICAL DATA: RIGHT-side facial pain since yesterday, facial
swelling which has decreased following application of ice, increased
pain with talking and jaw movement/eating

EXAM:
CT MAXILLOFACIAL WITHOUT CONTRAST
TECHNIQUE: Multidetector CT imaging of the maxillofacial structures was
performed. Multiplanar CT image reconstructions were also generated.
A small metallic BB was placed on the right temple in order to
reliably differentiate right from left.

[Series 2: max soft · axial · 0.34mm/px · z∈[-260,-84]mm · 9 of 102 slices shown, 12 images]
[im 7/102  brain]
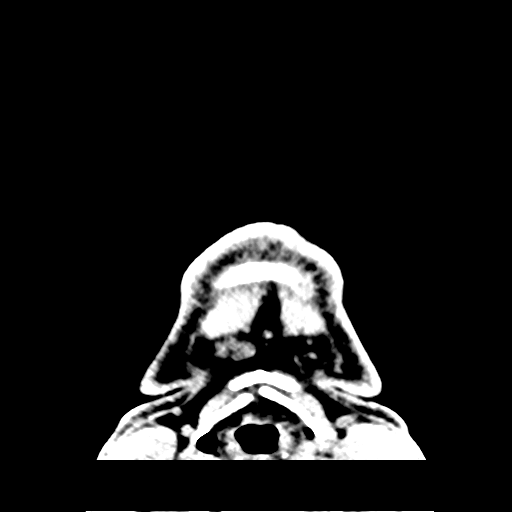
[im 7/102  bone]
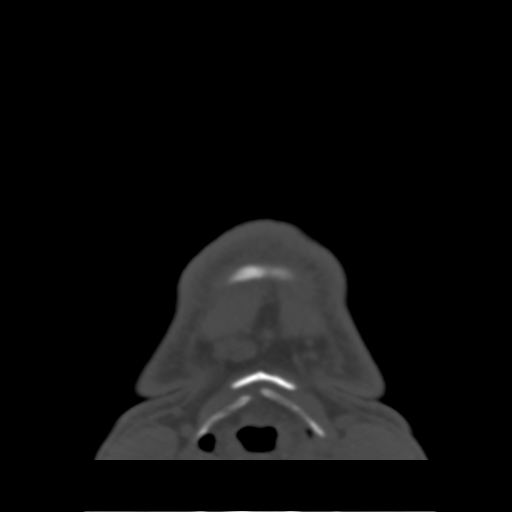
[im 18/102  bone]
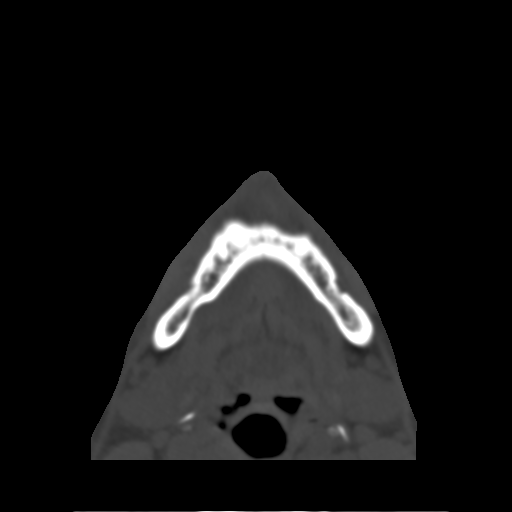
[im 28/102  bone]
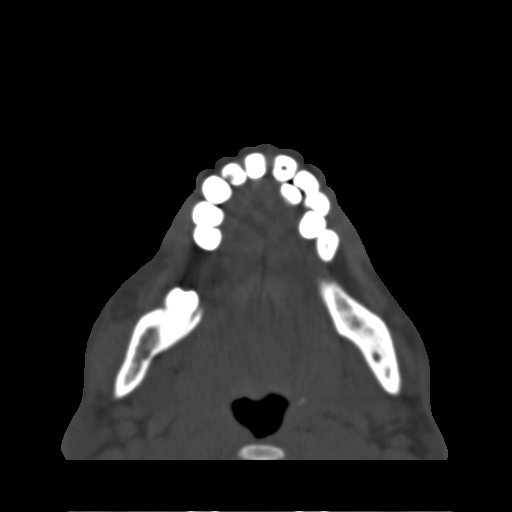
[im 39/102  bone]
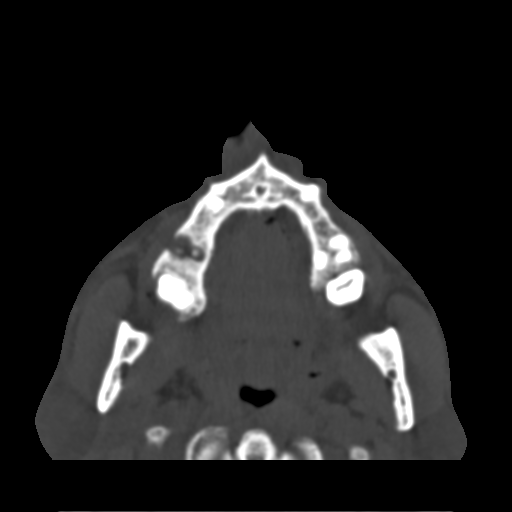
[im 53/102  brain]
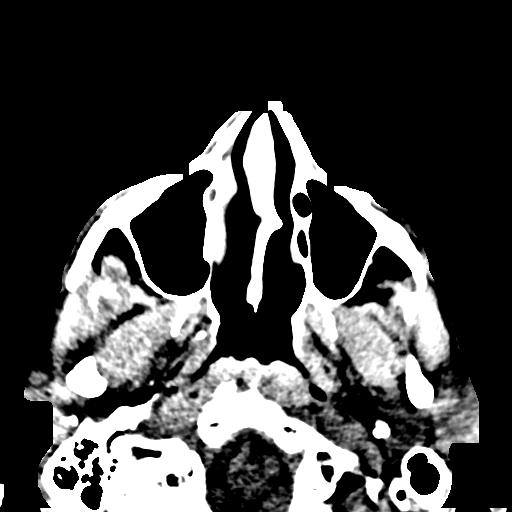
[im 53/102  bone]
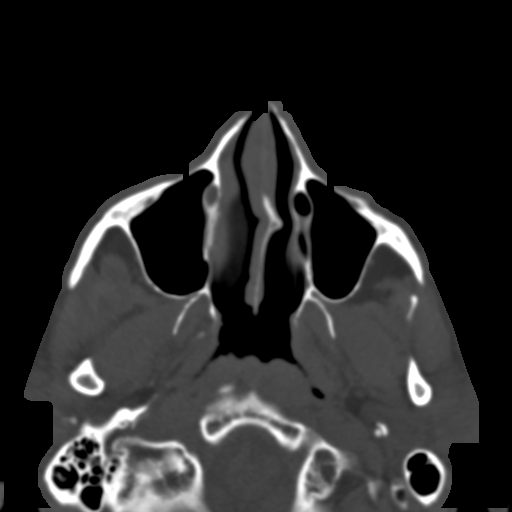
[im 63/102  bone]
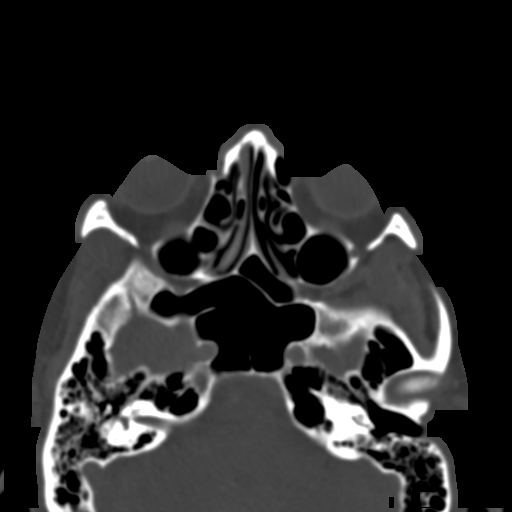
[im 74/102  bone]
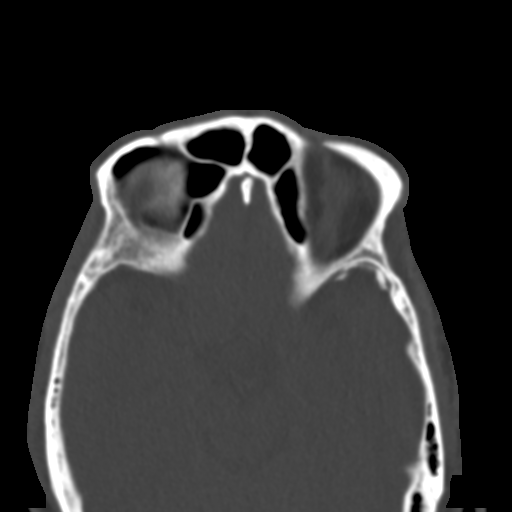
[im 84/102  bone]
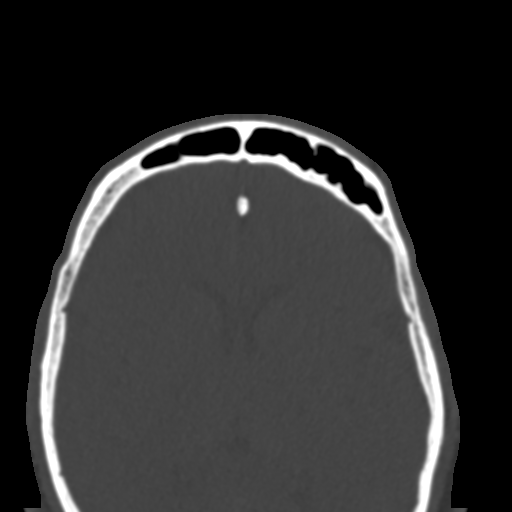
[im 95/102  brain]
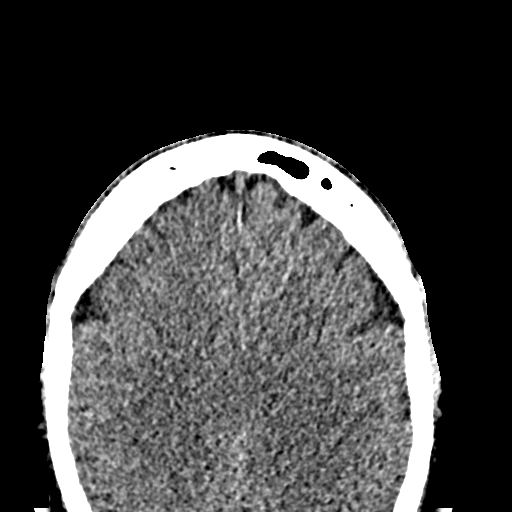
[im 95/102  bone]
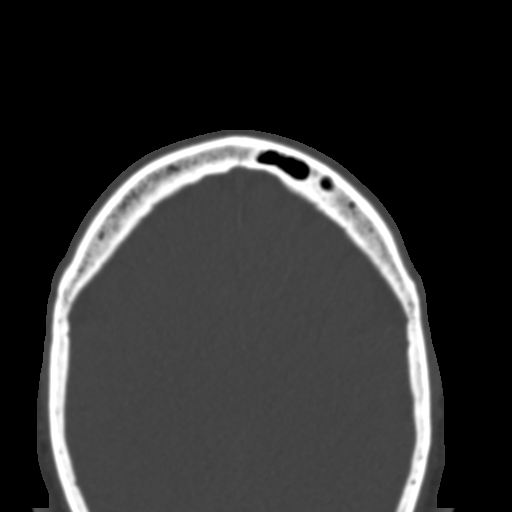

[Series 6: coronal soft · coronal · 0.32mm/px · 3 of 72 slices shown]
[im 24/72  bone]
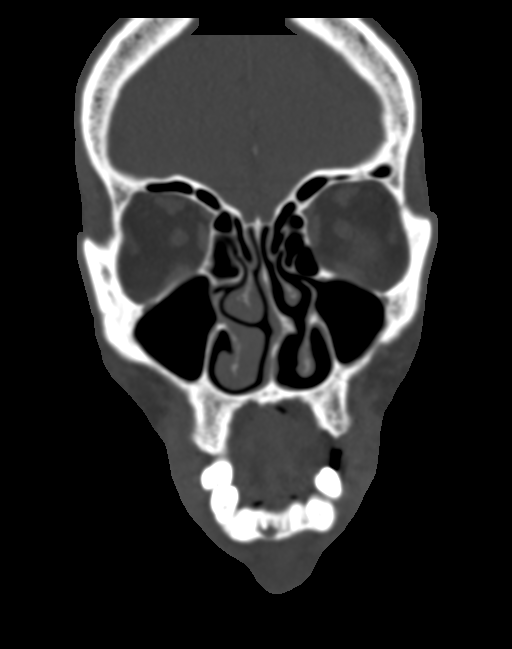
[im 32/72  bone]
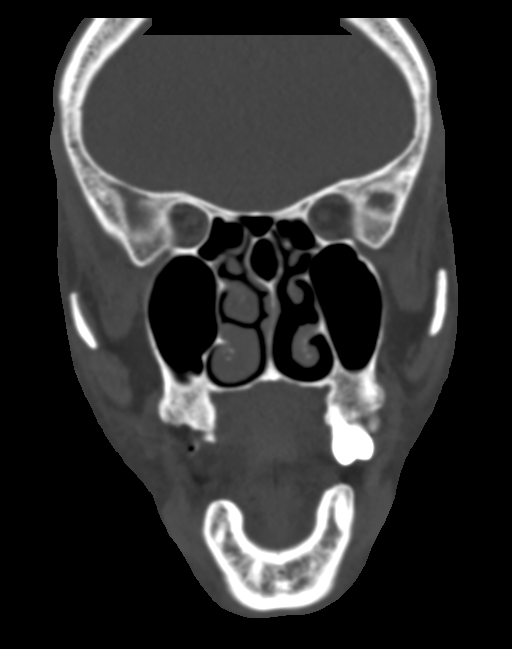
[im 40/72  bone]
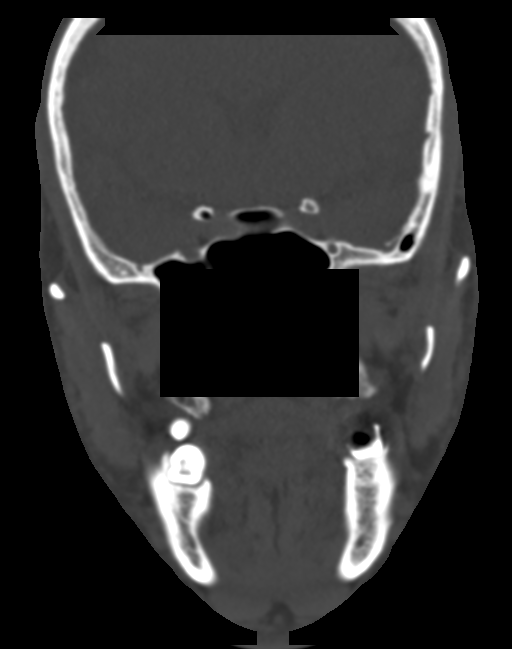

[Series 7: sagittal soft · sagittal · 0.34mm/px · 3 of 81 slices shown]
[im 27/81  bone]
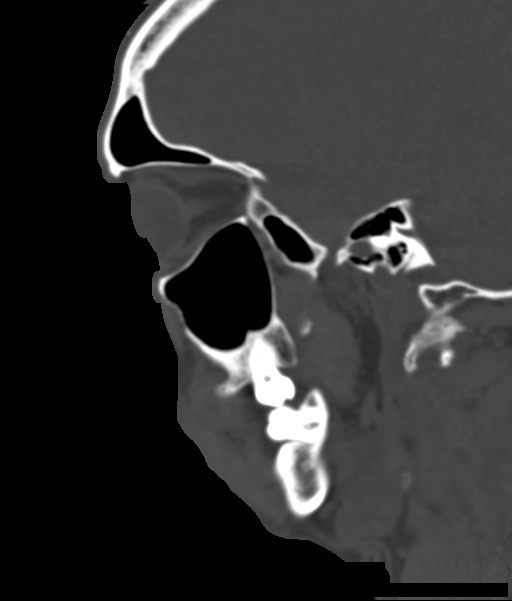
[im 41/81  bone]
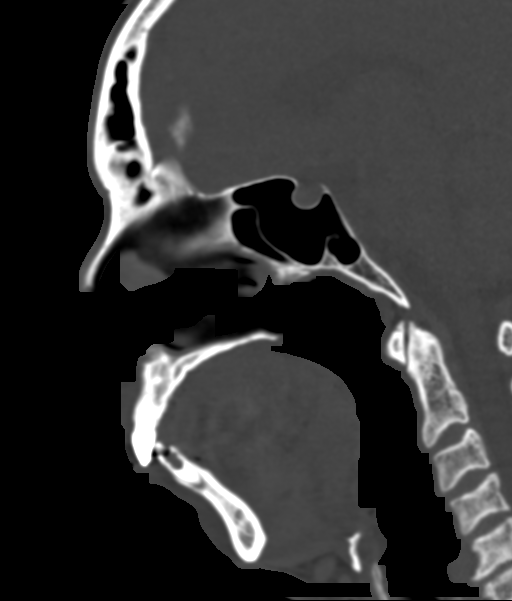
[im 54/81  bone]
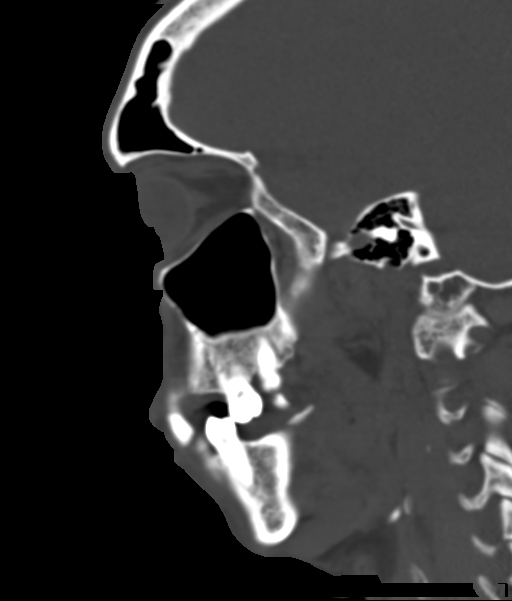

[15 of 47 positions shown; findings below may reference images not displayed]

FINDINGS: Osseous: Temporomandibular joints normally aligned without
significant degenerative changes. Osseous structures intact. No
fracture or bone destruction. Nasal septal deviation to the LEFT.
Periodontal lucencies at teeth #2 and #25. Scattered dental caries.

Orbits: Bony orbits intact.  Soft tissue planes clear.

Sinuses: Clear without significant mucosal thickening, opacification
or air-fluid levels

Soft tissues: Symmetric parotid and submandibular glands. Scattered
normal size cervical nodes. Prevertebral soft tissues normal
thickness. No mass or abnormal fluid collection identified.

Limited intracranial: Normal appearance
IMPRESSION: Scattered dental caries and periodontal disease.

Otherwise negative exam.

## 2018-06-13 ENCOUNTER — Inpatient Hospital Stay: Payer: BLUE CROSS/BLUE SHIELD

## 2018-06-13 DIAGNOSIS — E538 Deficiency of other specified B group vitamins: Secondary | ICD-10-CM

## 2018-06-13 MED ORDER — CYANOCOBALAMIN 1000 MCG/ML IJ SOLN
1000.0000 ug | Freq: Once | INTRAMUSCULAR | Status: AC
  Administered 2018-06-13: 1000 ug via INTRAMUSCULAR

## 2018-06-16 ENCOUNTER — Ambulatory Visit: Payer: Self-pay | Admitting: Nurse Practitioner

## 2018-06-20 ENCOUNTER — Inpatient Hospital Stay: Payer: BLUE CROSS/BLUE SHIELD

## 2018-06-20 DIAGNOSIS — E538 Deficiency of other specified B group vitamins: Secondary | ICD-10-CM

## 2018-06-20 MED ORDER — CYANOCOBALAMIN 1000 MCG/ML IJ SOLN
1000.0000 ug | Freq: Once | INTRAMUSCULAR | Status: AC
  Administered 2018-06-20: 1000 ug via INTRAMUSCULAR

## 2018-06-25 ENCOUNTER — Other Ambulatory Visit: Payer: Self-pay

## 2018-06-25 DIAGNOSIS — C61 Malignant neoplasm of prostate: Secondary | ICD-10-CM

## 2018-06-26 ENCOUNTER — Other Ambulatory Visit: Payer: BLUE CROSS/BLUE SHIELD

## 2018-06-26 DIAGNOSIS — C61 Malignant neoplasm of prostate: Secondary | ICD-10-CM

## 2018-06-27 ENCOUNTER — Inpatient Hospital Stay: Payer: BLUE CROSS/BLUE SHIELD | Attending: Oncology

## 2018-06-27 DIAGNOSIS — E538 Deficiency of other specified B group vitamins: Secondary | ICD-10-CM | POA: Diagnosis not present

## 2018-06-27 LAB — PSA: Prostate Specific Ag, Serum: <0.1 ng/mL (ref 0.0–4.0)

## 2018-06-27 MED ORDER — CYANOCOBALAMIN 1000 MCG/ML IJ SOLN
1000.0000 ug | Freq: Once | INTRAMUSCULAR | Status: AC
  Administered 2018-06-27: 1000 ug via INTRAMUSCULAR

## 2018-07-01 ENCOUNTER — Telehealth: Payer: Self-pay | Admitting: Family Medicine

## 2018-07-01 NOTE — Telephone Encounter (Signed)
-----   Message from Hollice Espy, MD sent at 06/30/2018  8:45 AM EST ----- PSA remains undetectable. Great news!  Hollice Espy, MD

## 2018-07-01 NOTE — Telephone Encounter (Signed)
Patient notified and voiced understanding.

## 2018-07-22 ENCOUNTER — Other Ambulatory Visit: Payer: Self-pay | Admitting: Urology

## 2018-08-14 ENCOUNTER — Other Ambulatory Visit: Payer: Self-pay

## 2018-08-14 ENCOUNTER — Encounter: Payer: Self-pay | Admitting: Adult Health

## 2018-08-14 ENCOUNTER — Ambulatory Visit (INDEPENDENT_AMBULATORY_CARE_PROVIDER_SITE_OTHER): Payer: BLUE CROSS/BLUE SHIELD | Admitting: Nurse Practitioner

## 2018-08-14 VITALS — BP 138/88 | HR 67 | Resp 16 | Ht 67.0 in | Wt 155.0 lb

## 2018-08-14 DIAGNOSIS — E7800 Pure hypercholesterolemia, unspecified: Secondary | ICD-10-CM | POA: Diagnosis not present

## 2018-08-14 DIAGNOSIS — Z0001 Encounter for general adult medical examination with abnormal findings: Secondary | ICD-10-CM | POA: Diagnosis not present

## 2018-08-14 DIAGNOSIS — R300 Dysuria: Secondary | ICD-10-CM

## 2018-08-14 DIAGNOSIS — R7309 Other abnormal glucose: Secondary | ICD-10-CM

## 2018-08-14 LAB — POCT GLYCOSYLATED HEMOGLOBIN (HGB A1C): Hemoglobin A1C: 5.9 % — AB (ref 4.0–5.6)

## 2018-08-14 MED ORDER — PRAVASTATIN SODIUM 40 MG PO TABS: 40.0000 mg | ORAL_TABLET | Freq: Every day | ORAL | 5 refills | Status: DC

## 2018-08-14 NOTE — Progress Notes (Signed)
[facility], [place] [phone]  Internal MEDICINE  Office Visit Note  Patient Name: Donald Mendoza  [id number]  [id number]  Date of Service: 08/14/2018  Chief Complaint  Patient presents with  . Annual Exam    needing medication refills   . Hyperlipidemia  . Gastroesophageal Reflux     The patient is here for health maintenance exam. Blood pressure are well managed. Blood sugars well controlled without medication. He states that he quit smoking completely about 2 months ago. Was able to lay down the cigarette pack and not pick it back out without any assistance.  He states that he recently was told he is cancer free from urology. He will continue to see urologist every 6 months for check ups.   Pt is here for routine health maintenance examination  Current Medication: Outpatient Encounter Medications as of 08/14/2018  Medication Sig  . latanoprost (XALATAN) 0.005 % ophthalmic solution INT 1 GTT IN OU 1 TIME IN THE EVE  . pravastatin (PRAVACHOL) 40 MG tablet Take 1 tablet (40 mg total) by mouth daily.  . [DISCONTINUED] pravastatin (PRAVACHOL) 40 MG tablet Take 1 tablet (40 mg total) by mouth daily.  Marland Kitchen omeprazole (PRILOSEC) 20 MG capsule Take 1 capsule (20 mg total) by mouth daily as needed. (Patient not taking: Reported on 08/14/2018)  . sildenafil (REVATIO) 20 MG tablet TAKE 1 TO 5 TABLETS AS NEEDED PRIOR TO INTERCOURSE (Patient not taking: Reported on 08/14/2018)   No facility-administered encounter medications on file as of 08/14/2018.     Surgical History: Past Surgical History:  Procedure Laterality Date  . COLONOSCOPY    . HERNIA REPAIR     10 years ago, [place]. umbilical hernia x 2  . PELVIC LYMPH NODE DISSECTION Bilateral 05/27/2017   Procedure: PELVIC LYMPH NODE DISSECTION;  Surgeon: Hollice Espy, MD;  Location: [facility] ORS;  Service: Urology;  Laterality: Bilateral;  . ROBOT ASSISTED LAPAROSCOPIC RADICAL PROSTATECTOMY N/A 05/27/2017    Procedure: ROBOTIC ASSISTED LAPAROSCOPIC RADICAL PROSTATECTOMY;  Surgeon: Hollice Espy, MD;  Location: [facility] ORS;  Service: Urology;  Laterality: N/A;    Medical History: Past Medical History:  Diagnosis Date  . B12 deficiency 04/21/2017  . Cancer [facility]) 2018   Prostate Cancer  . Diverticulosis   . GERD (gastroesophageal reflux disease)   . Hypercholesteremia     Family History: Family History  Problem Relation Age of Onset  . Diabetes Mother   . Breast cancer Mother   . Arthritis Father   . Breast cancer Sister       Review of Systems  Constitutional: Positive for fatigue. Negative for activity change, appetite change, chills and fever.  HENT: Negative for congestion, postnasal drip, rhinorrhea and sinus pain.   Respiratory: Negative for shortness of breath and wheezing.   Cardiovascular: Negative for chest pain and palpitations.  Gastrointestinal: Negative for constipation, diarrhea, nausea and vomiting.  Endocrine: Negative for cold intolerance, heat intolerance, polydipsia and polyuria.       Blood sugars doing well   Genitourinary: Negative for frequency and urgency.       Patient seeing urology for prostate cancer treatment  Musculoskeletal: Negative for arthralgias and back pain.  Skin: Negative for rash.  Allergic/Immunologic: Negative for environmental allergies.  Neurological: Negative for weakness and headaches.  Hematological: Negative for adenopathy.  Psychiatric/Behavioral: Negative for behavioral problems and dysphoric mood. The patient is not nervous/anxious.      Today's Vitals   08/14/18 0912  BP: 138/88  Pulse: 67  Resp: 16  SpO2: 97%  Weight: 155 lb (70.3 kg)  Height: 5\' 7"  (1.702 m)   Body mass index is 24.28 kg/m.  Physical Exam Vitals signs and nursing note reviewed.  Constitutional:      General: He is not in acute distress.    Appearance: Normal appearance. He is well-developed. He is not diaphoretic.  HENT:     Head:  Normocephalic and atraumatic.     Mouth/Throat:     Pharynx: No oropharyngeal exudate.  Eyes:     Pupils: Pupils are equal, round, and reactive to light.  Neck:     Musculoskeletal: Normal range of motion and neck supple.     Thyroid: No thyromegaly.     Vascular: No carotid bruit or JVD.     Trachea: No tracheal deviation.  Cardiovascular:     Rate and Rhythm: Normal rate and regular rhythm.     Pulses: Normal pulses.          Dorsalis pedis pulses are 2+ on the right side and 2+ on the left side.       Posterior tibial pulses are 2+ on the right side and 2+ on the left side.     Heart sounds: Normal heart sounds. No murmur. No friction rub. No gallop.   Pulmonary:     Effort: Pulmonary effort is normal. No respiratory distress.     Breath sounds: Normal breath sounds. No wheezing or rales.  Chest:     Chest wall: No tenderness.  Abdominal:     General: Bowel sounds are normal.     Palpations: Abdomen is soft.     Tenderness: There is no abdominal tenderness.  Genitourinary:    Comments: Urine sample is positive for moderate WBC. Also positive for protein and nitrites.  Musculoskeletal: Normal range of motion.     Right foot: Normal range of motion. No deformity.     Left foot: Normal range of motion. No deformity.  Feet:     Right foot:     Protective Sensation: 10 sites tested. 10 sites sensed.     Skin integrity: Skin integrity normal.     Left foot:     Protective Sensation: 10 sites tested. 10 sites sensed.     Skin integrity: Skin integrity normal.  Lymphadenopathy:     Cervical: No cervical adenopathy.  Skin:    General: Skin is warm and dry.  Neurological:     General: No focal deficit present.     Mental Status: He is alert and oriented to person, place, and time.     Cranial Nerves: No cranial nerve deficit.  Psychiatric:        Behavior: Behavior normal.        Thought Content: Thought content normal.        Judgment: Judgment normal.       LABS: Recent Results (from the past 2160 hour(s))  PSA     Status: None   Collection Time: 06/26/18 10:40 AM  Result Value Ref Range   Prostate Specific Ag, Serum <0.1 0.0 - 4.0 ng/mL    Comment: Roche ECLIA methodology. According to the American Urological Association, Serum PSA should decrease and remain at undetectable levels after radical prostatectomy. The AUA defines biochemical recurrence as an initial PSA value 0.2 ng/mL or greater followed by a subsequent confirmatory PSA value 0.2 ng/mL or greater. Values obtained with different assay methods or kits cannot be used interchangeably. Results cannot be interpreted as  absolute evidence of the presence or absence of malignant disease.   POCT HgB A1C     Status: Abnormal   Collection Time: 08/14/18  9:30 AM  Result Value Ref Range   Hemoglobin A1C 5.9 (A) 4.0 - 5.6 %   HbA1c POC (<> result, manual entry)     HbA1c, POC (prediabetic range)     HbA1c, POC (controlled diabetic range)      Assessment/Plan: 1. Encounter for health maintenance examination with abnormal findings Annual health maintenance exam today  2. Hypercholesteremia Check fasting labs and adjust pravastatin 40mg  daily.  - pravastatin (PRAVACHOL) 40 MG tablet; Take 1 tablet (40 mg total) by mouth daily.  Dispense: 30 tablet; Refill: 5  3. Elevated glucose level - POCT HgB A1C 5.9 today.   4. Dysuria - UA/M w/rflx Culture, Routine  General Counseling: Rasool verbalizes understanding of the findings of todays visit and agrees with plan of treatment. I have discussed any further diagnostic evaluation that may be needed or ordered today. We also reviewed his medications today. he has been encouraged to call the office with any questions or concerns that should arise related to todays visit.    Counseling:  This patient was seen by Leretha Pol FNP Collaboration with Dr Lavera Guise as a part of collaborative care agreement  Orders Placed This  Encounter  Procedures  . UA/M w/rflx Culture, Routine  . POCT HgB A1C    Meds ordered this encounter  Medications  . pravastatin (PRAVACHOL) 40 MG tablet    Sig: Take 1 tablet (40 mg total) by mouth daily.    Dispense:  30 tablet    Refill:  5    Order Specific Question:   Supervising Provider    Answer:   Lavera Guise [[id number]]    Time spent: [place], MD  Internal Medicine

## 2018-08-15 LAB — UA/M W/RFLX CULTURE, ROUTINE
Bilirubin, UA: NEGATIVE
Glucose, UA: NEGATIVE
Ketones, UA: NEGATIVE
Leukocytes, UA: NEGATIVE
Nitrite, UA: NEGATIVE
PH UA: 5 (ref 5.0–7.5)
Protein, UA: NEGATIVE
RBC, UA: NEGATIVE
Specific Gravity, UA: 1.021 (ref 1.005–1.030)
Urobilinogen, Ur: 0.2 mg/dL (ref 0.2–1.0)

## 2018-08-15 LAB — MICROSCOPIC EXAMINATION
EPITHELIAL CELLS (NON RENAL): NONE SEEN /HPF (ref 0–10)
RBC, UA: NONE SEEN /hpf (ref 0–2)

## 2018-10-31 ENCOUNTER — Other Ambulatory Visit: Payer: Self-pay

## 2018-10-31 ENCOUNTER — Telehealth: Payer: Self-pay | Admitting: Oncology

## 2018-10-31 ENCOUNTER — Inpatient Hospital Stay: Payer: BC Managed Care – PPO

## 2018-10-31 NOTE — Telephone Encounter (Signed)
Spoke to pt and completed travel screen. Also explained about addl screening questions they will be asked, new guidelines about mask req, no visitors, and fever checks °

## 2018-11-03 ENCOUNTER — Other Ambulatory Visit: Payer: Self-pay

## 2018-11-03 ENCOUNTER — Encounter: Payer: Self-pay | Admitting: Oncology

## 2018-11-03 ENCOUNTER — Other Ambulatory Visit: Payer: BLUE CROSS/BLUE SHIELD

## 2018-11-03 ENCOUNTER — Inpatient Hospital Stay: Payer: BC Managed Care – PPO | Attending: Oncology | Admitting: Oncology

## 2018-11-03 ENCOUNTER — Inpatient Hospital Stay: Payer: BC Managed Care – PPO

## 2018-11-03 VITALS — BP 116/73 | HR 71 | Temp 97.7°F | Wt 147.6 lb

## 2018-11-03 DIAGNOSIS — Z87891 Personal history of nicotine dependence: Secondary | ICD-10-CM

## 2018-11-03 DIAGNOSIS — C61 Malignant neoplasm of prostate: Secondary | ICD-10-CM | POA: Diagnosis not present

## 2018-11-03 DIAGNOSIS — D696 Thrombocytopenia, unspecified: Secondary | ICD-10-CM

## 2018-11-03 DIAGNOSIS — E538 Deficiency of other specified B group vitamins: Secondary | ICD-10-CM

## 2018-11-03 LAB — CBC WITH DIFFERENTIAL/PLATELET
Abs Immature Granulocytes: 0.02 10*3/uL (ref 0.00–0.07)
Basophils Absolute: 0 10*3/uL (ref 0.0–0.1)
Basophils Relative: 0 %
Eosinophils Absolute: 1 10*3/uL — ABNORMAL HIGH (ref 0.0–0.5)
Eosinophils Relative: 13 %
HCT: 43.5 % (ref 39.0–52.0)
Hemoglobin: 14.4 g/dL (ref 13.0–17.0)
Immature Granulocytes: 0 %
Lymphocytes Relative: 25 %
Lymphs Abs: 1.9 10*3/uL (ref 0.7–4.0)
MCH: 28.7 pg (ref 26.0–34.0)
MCHC: 33.1 g/dL (ref 30.0–36.0)
MCV: 86.7 fL (ref 80.0–100.0)
Monocytes Absolute: 0.4 10*3/uL (ref 0.1–1.0)
Monocytes Relative: 6 %
Neutro Abs: 4.2 10*3/uL (ref 1.7–7.7)
Neutrophils Relative %: 56 %
Platelets: 107 10*3/uL — ABNORMAL LOW (ref 150–400)
RBC: 5.02 MIL/uL (ref 4.22–5.81)
RDW: 13.7 % (ref 11.5–15.5)
WBC: 7.6 10*3/uL (ref 4.0–10.5)
nRBC: 0 % (ref 0.0–0.2)

## 2018-11-03 LAB — COMPREHENSIVE METABOLIC PANEL
ALT: 16 U/L (ref 0–44)
AST: 19 U/L (ref 15–41)
Albumin: 4.3 g/dL (ref 3.5–5.0)
Alkaline Phosphatase: 112 U/L (ref 38–126)
Anion gap: 7 (ref 5–15)
BUN: 14 mg/dL (ref 6–20)
CO2: 25 mmol/L (ref 22–32)
Calcium: 8.9 mg/dL (ref 8.9–10.3)
Chloride: 105 mmol/L (ref 98–111)
Creatinine, Ser: 0.91 mg/dL (ref 0.61–1.24)
GFR calc Af Amer: >60 mL/min (ref >60)
GFR calc non Af Amer: >60 mL/min (ref >60)
Glucose, Bld: 119 mg/dL — ABNORMAL HIGH (ref 70–99)
Potassium: 3.9 mmol/L (ref 3.5–5.1)
Sodium: 137 mmol/L (ref 135–145)
Total Bilirubin: 0.3 mg/dL (ref 0.3–1.2)
Total Protein: 7.2 g/dL (ref 6.5–8.1)

## 2018-11-03 LAB — VITAMIN B12: Vitamin B-12: 228 pg/mL (ref 180–914)

## 2018-11-03 LAB — PSA: Prostatic Specific Antigen: <0.01 ng/mL (ref 0.00–4.00)

## 2018-11-03 MED ORDER — VITAMIN B-12 1000 MCG PO TABS: 1000.0000 ug | ORAL_TABLET | Freq: Every day | ORAL | 1 refills | Status: DC

## 2018-11-03 NOTE — Progress Notes (Signed)
Hematology/Oncology  Follow up note [facility] Telephone:([phone] Fax:([phone]) [phone]   Patient Care Team: Ronnell Freshwater, NP as PCP - General (Family Medicine) Christie Nottingham, PA as Referring Physician (Physician Assistant) Christene Lye, MD (General Surgery)  PURPOSE OF CONSULTATION:  Follow up of management of thrombocytopenia.  HISTORY OF PRESENTING ILLNESS:  Donald Mendoza is a  58 y.o.  male with PMH listed below who was referred to me for evaluation of thrombocytopenia. Patient was recently diagnosed with intermediate risk prostate cancer in the follows up with Dr. Erlene Quan. Initially he was diagnosed with a low-risk prostate cancer Gleason score 3+3 in March 2018 when his PSA was 4. PSA continued to rise to 5.2 and patient had a repeat prostate biopsy revealing Gleason score 4+3. Patient was scheduled for radical prostatectomy however was found to have thrombocytopenia. Referred to [place] to evaluation for the low platelet count. Patient reports that he has been told that his platelet has been low in the past. He never received any treatment.  # S/p RALP with BPLND 05/27/2017, pathology showed Gleason 4+3, pT2 N0, NEGATIVE FOR EXTRAPROSTATIC EXTENSION, SEMINAL VESICLE INVASION, AND BLADDER NECK INVASION. - MARGINS NEGATIVE FOR CARCINOMA.  # Prostate cancer pT2N0, PSA <0.1, no adverse features, , no adjuvant treatment was offered.  # 11/15/2017 bone marrow biopsy showed normocellular bone marrow for age with trilineage hematopoiesis.  Bone marrow showed megakaryocytes with predominantly normal morphology.  Suggesting peripheral destruction, sequestration or consumption of platelets. Bone marrow cytogenetics were normal. Discussed with patient about his bone marrow biopsy results.  His chronic thrombocytopenia most likely secondary to ITP, possibly due to underlying autoimmune process.    INTERVAL HISTORY 58 y.o. male with history of prostate cancer s/p  prostectomy, ITP presents for follow-up. Reports feeling pretty well at baseline today. He has quitted smoking. Chronic ITP, denies any acute bleeding or easy bruising. Prostate cancer, PSA less than 0.1 on June 26, 2018.  Denies any back pain, urinary symptoms.  No other new complaints..  Review of Systems  Constitutional: Negative for chills, fever, malaise/fatigue and weight loss.  HENT: Negative for sore throat.   Eyes: Negative for redness.  Respiratory: Negative for cough, shortness of breath and wheezing.   Cardiovascular: Negative for chest pain, palpitations and leg swelling.  Gastrointestinal: Negative for abdominal pain, blood in stool, nausea and vomiting.  Genitourinary: Negative for dysuria.  Musculoskeletal: Negative for myalgias.  Skin: Negative for rash.  Neurological: Negative for dizziness, tingling and tremors.  Endo/Heme/Allergies: Does not bruise/bleed easily.  Psychiatric/Behavioral: Negative for hallucinations.    MEDICAL HISTORY:  Past Medical History:  Diagnosis Date  . B12 deficiency 04/21/2017  . Cancer [facility]) 2018   Prostate Cancer  . Diverticulosis   . GERD (gastroesophageal reflux disease)   . Hypercholesteremia     SURGICAL HISTORY: Past Surgical History:  Procedure Laterality Date  . COLONOSCOPY    . HERNIA REPAIR     10 years ago, [place]. umbilical hernia x 2  . PELVIC LYMPH NODE DISSECTION Bilateral 05/27/2017   Procedure: PELVIC LYMPH NODE DISSECTION;  Surgeon: Hollice Espy, MD;  Location: [facility] ORS;  Service: Urology;  Laterality: Bilateral;  . ROBOT ASSISTED LAPAROSCOPIC RADICAL PROSTATECTOMY N/A 05/27/2017   Procedure: ROBOTIC ASSISTED LAPAROSCOPIC RADICAL PROSTATECTOMY;  Surgeon: Hollice Espy, MD;  Location: [facility] ORS;  Service: Urology;  Laterality: N/A;    SOCIAL HISTORY: Social History   Socioeconomic History  . Marital status: Single    Spouse name: Not on  file  . Number of children: Not on file  . Years of education: Not  on file  . Highest education level: Not on file  Occupational History  . Not on file  Social Needs  . Financial resource strain: Not on file  . Food insecurity    Worry: Not on file    Inability: Not on file  . Transportation needs    Medical: Not on file    Non-medical: Not on file  Tobacco Use  . Smoking status: Former Smoker    Packs/day: 0.50    Years: 34.00    Pack years: 17.00    Types: Cigarettes    Quit date: 06/15/2018    Years since quitting: 0.3  . Smokeless tobacco: Never Used  Substance and Sexual Activity  . Alcohol use: Not Currently  . Drug use: No  . Sexual activity: Yes  Lifestyle  . Physical activity    Days per week: Not on file    Minutes per session: Not on file  . Stress: Not on file  Relationships  . Social Herbalist on phone: Not on file    Gets together: Not on file    Attends religious service: Not on file    Active member of club or organization: Not on file    Attends meetings of clubs or organizations: Not on file    Relationship status: Not on file  . Intimate partner violence    Fear of current or ex partner: Not on file    Emotionally abused: Not on file    Physically abused: Not on file    Forced sexual activity: Not on file  Other Topics Concern  . Not on file  Social History Narrative  . Not on file    FAMILY HISTORY: Family History  Problem Relation Age of Onset  . Diabetes Mother   . Breast cancer Mother   . Arthritis Father   . Breast cancer Sister     ALLERGIES:  is allergic to sulfa antibiotics.  MEDICATIONS:  Current Outpatient Medications  Medication Sig Dispense Refill  . latanoprost (XALATAN) 0.005 % ophthalmic solution INT 1 GTT IN OU 1 TIME IN THE EVE  0  . omeprazole (PRILOSEC) 20 MG capsule Take 1 capsule (20 mg total) by mouth daily as needed. 30 capsule 5  . pravastatin (PRAVACHOL) 40 MG tablet Take 1 tablet (40 mg total) by mouth daily. 30 tablet 5  . sildenafil (REVATIO) 20 MG tablet TAKE  1 TO 5 TABLETS AS NEEDED PRIOR TO INTERCOURSE (Patient not taking: Reported on 08/14/2018) 30 tablet 6   No current facility-administered medications for this visit.      PHYSICAL EXAMINATION: ECOG PERFORMANCE STATUS: 0 - Asymptomatic Vitals:   11/03/18 1007  BP: 116/73  Pulse: 71  Temp: 97.7 F (36.5 C)   Filed Weights   11/03/18 1007  Weight: 147 lb 9.6 oz (67 kg)    Physical Exam  Constitutional: He is oriented to person, place, and time. No distress.  HENT:  Head: Normocephalic and atraumatic.  Nose: Nose normal.  Mouth/Throat: Oropharynx is clear and moist. No oropharyngeal exudate.  Eyes: Pupils are equal, round, and reactive to light. Conjunctivae and EOM are normal. Left eye exhibits no discharge. No scleral icterus.  Neck: Normal range of motion. Neck supple. No JVD present.  Cardiovascular: Normal rate, regular rhythm and normal heart sounds.  No murmur heard. Pulmonary/Chest: Effort normal and breath sounds normal. No respiratory distress. He  has no wheezes. He has no rales. He exhibits no tenderness.  Abdominal: Soft. Bowel sounds are normal. He exhibits no distension and no mass. There is no abdominal tenderness. There is no rebound and no guarding.  Musculoskeletal: Normal range of motion.        General: No tenderness or edema.  Lymphadenopathy:    He has no cervical adenopathy.  Neurological: He is alert and oriented to person, place, and time. No cranial nerve deficit. He exhibits normal muscle tone. Coordination normal.  Skin: Skin is warm and dry. No rash noted. He is not diaphoretic. No erythema.  Psychiatric: Memory, affect and judgment normal.     LABORATORY DATA:  I have reviewed the data as listed Lab Results  Component Value Date   WBC 7.6 11/03/2018   HGB 14.4 11/03/2018   HCT 43.5 11/03/2018   MCV 86.7 11/03/2018   PLT 107 (L) 11/03/2018   Recent Labs    05/05/18 1425 11/03/18 0929  NA 140 137  K 3.6 3.9  CL 104 105  CO2 28 25   GLUCOSE 163* 119*  BUN 11 14  CREATININE 0.89 0.91  CALCIUM 9.1 8.9  GFRNONAA >60 >60  GFRAA >60 >60  PROT 6.8 7.2  ALBUMIN 4.1 4.3  AST 19 19  ALT 20 16  ALKPHOS 118 112  BILITOT 0.3 0.3    RADIOGRAPHIC STUDIES: I have personally reviewed the radiological images as listed and agreed with the findings in the report. US Abdomen showed no liver parachyema changes or splenomegaly.    ASSESSMENT & PLAN:  1. B12 deficiency   2. Thrombocytopenia ([place])   3. Prostate cancer ([place])      # Chronic thrombocytopenia:  ITP,  Lab was reviewed and discussed with patient. Platelet count has been stable above 100,000. Normal hemoglobin and hematocrit.  Normal white cell. Continue observation . #Prostate cancer, pT2(m)  pN0. PSA 5.2, gleason score 4+3, grade group 3--Stage IIC, unfavorable intermediate risk group,  status post prostatectomy BPLND .   Today PSA is less than 0.01.  Continue follow-up and monitor lab in 6 months.   # History of vitamin B12 deficiency, B12 has improved to 228.s/p IM vitamin injections.  Recommend patient to start oral vitamin b12 supplementation.  Check intrinsic factor antibody and antiparietal antibody at next visit.  Return of visit: 6 months.  We spent sufficient time to discuss many aspect of care, questions were answered to patient's satisfaction. Total face to face encounter time for this patient visit was 25 min. >50% of the time was  spent in counseling and coordination of care.    Earlie Server, MD, PhD 11/03/2018

## 2018-11-04 ENCOUNTER — Telehealth: Payer: Self-pay | Admitting: *Deleted

## 2018-11-04 NOTE — Telephone Encounter (Signed)
Already done

## 2018-12-25 ENCOUNTER — Other Ambulatory Visit: Payer: BC Managed Care – PPO

## 2018-12-25 ENCOUNTER — Encounter: Payer: Self-pay | Admitting: Urology

## 2018-12-29 ENCOUNTER — Ambulatory Visit: Payer: BC Managed Care – PPO | Admitting: Adult Health

## 2018-12-29 ENCOUNTER — Other Ambulatory Visit: Payer: Self-pay | Admitting: Adult Health

## 2018-12-29 ENCOUNTER — Other Ambulatory Visit: Payer: Self-pay

## 2018-12-29 ENCOUNTER — Encounter: Payer: Self-pay | Admitting: Adult Health

## 2018-12-29 VITALS — BP 112/80 | HR 82 | Temp 97.7°F | Resp 16 | Ht 67.0 in | Wt 147.0 lb

## 2018-12-29 DIAGNOSIS — F17210 Nicotine dependence, cigarettes, uncomplicated: Secondary | ICD-10-CM | POA: Diagnosis not present

## 2018-12-29 DIAGNOSIS — K219 Gastro-esophageal reflux disease without esophagitis: Secondary | ICD-10-CM

## 2018-12-29 DIAGNOSIS — R7309 Other abnormal glucose: Secondary | ICD-10-CM

## 2018-12-29 DIAGNOSIS — E7800 Pure hypercholesterolemia, unspecified: Secondary | ICD-10-CM

## 2018-12-29 DIAGNOSIS — Z0001 Encounter for general adult medical examination with abnormal findings: Secondary | ICD-10-CM | POA: Diagnosis not present

## 2018-12-29 DIAGNOSIS — E559 Vitamin D deficiency, unspecified: Secondary | ICD-10-CM | POA: Diagnosis not present

## 2018-12-29 LAB — POCT GLYCOSYLATED HEMOGLOBIN (HGB A1C)
HbA1c POC (<> result, manual entry): 5.9 % (ref 4.0–5.6)
Hemoglobin A1C: 5 % (ref 4.0–5.6)

## 2018-12-29 NOTE — Progress Notes (Signed)
[facility] [place], [place] [address]  Internal MEDICINE  Office Visit Note  Patient Name: Donald Mendoza  [id number]  [id number]  Date of Service: 12/29/2018  Chief Complaint  Patient presents with  . Fatigue    increased sleepiness      HPI Pt is here for a sick visit. Pt reports he has been having issues with staying awake especially when driving.  He reports a non-traditional work schedule. He currently  Works from [employer]  Then 7pm-11pm at a [employer]. He takes a nap between.  He reports never feeling rested.  He has nearly fallen asleep while driving on multiple occasions. We discussed that he is not getting enough sleep, and we discussed the possibility of a sleep study.    Current Medication:  Outpatient Encounter Medications as of 12/29/2018  Medication Sig  . latanoprost (XALATAN) 0.005 % ophthalmic solution INT 1 GTT IN OU 1 TIME IN THE EVE  . omeprazole (PRILOSEC) 20 MG capsule Take 1 capsule (20 mg total) by mouth daily as needed.  . pravastatin (PRAVACHOL) 40 MG tablet Take 1 tablet (40 mg total) by mouth daily.  . vitamin B-12 (CYANOCOBALAMIN) 1000 MCG tablet Take 1 tablet (1,000 mcg total) by mouth daily.  . [DISCONTINUED] sildenafil (REVATIO) 20 MG tablet TAKE 1 TO 5 TABLETS AS NEEDED PRIOR TO INTERCOURSE (Patient not taking: Reported on 08/14/2018)   No facility-administered encounter medications on file as of 12/29/2018.       Medical History: Past Medical History:  Diagnosis Date  . B12 deficiency 04/21/2017  . Cancer [facility]) 2018   Prostate Cancer  . Diverticulosis   . GERD (gastroesophageal reflux disease)   . Hypercholesteremia      Vital Signs: BP 112/80   Pulse 82   Temp 97.7 F (36.5 C)   Resp 16   Ht 5\' 7"  (1.702 m)   Wt 147 lb (66.7 kg)   SpO2 97%   BMI 23.02 kg/m    Review of Systems  Constitutional: Negative.  Negative for chills, fatigue and unexpected weight change.  HENT: Negative.   Negative for congestion, rhinorrhea, sneezing and sore throat.   Eyes: Negative for redness.  Respiratory: Negative.  Negative for cough, chest tightness and shortness of breath.   Cardiovascular: Negative.  Negative for chest pain and palpitations.  Gastrointestinal: Negative.  Negative for abdominal pain, constipation, diarrhea, nausea and vomiting.  Endocrine: Negative.   Genitourinary: Negative.  Negative for dysuria and frequency.  Musculoskeletal: Negative.  Negative for arthralgias, back pain, joint swelling and neck pain.  Skin: Negative.  Negative for rash.  Allergic/Immunologic: Negative.   Neurological: Negative.  Negative for tremors and numbness.  Hematological: Negative for adenopathy. Does not bruise/bleed easily.  Psychiatric/Behavioral: Negative.  Negative for behavioral problems, sleep disturbance and suicidal ideas. The patient is not nervous/anxious.     Physical Exam Vitals signs and nursing note reviewed.  Constitutional:      General: He is not in acute distress.    Appearance: He is well-developed. He is not diaphoretic.  HENT:     Head: Normocephalic and atraumatic.     Mouth/Throat:     Pharynx: No oropharyngeal exudate.  Eyes:     Pupils: Pupils are equal, round, and reactive to light.  Neck:     Musculoskeletal: Normal range of motion and neck supple.     Thyroid: No thyromegaly.     Vascular: No JVD.     Trachea: No tracheal deviation.  Cardiovascular:     Rate and Rhythm: Normal rate and regular rhythm.     Heart sounds: Normal heart sounds. No murmur. No friction rub. No gallop.   Pulmonary:     Effort: Pulmonary effort is normal. No respiratory distress.     Breath sounds: Normal breath sounds. No wheezing or rales.  Chest:     Chest wall: No tenderness.  Abdominal:     Palpations: Abdomen is soft.     Tenderness: There is no abdominal tenderness. There is no guarding.  Musculoskeletal: Normal range of motion.  Lymphadenopathy:     Cervical:  No cervical adenopathy.  Skin:    General: Skin is warm and dry.  Neurological:     Mental Status: He is alert and oriented to person, place, and time.     Cranial Nerves: No cranial nerve deficit.  Psychiatric:        Behavior: Behavior normal.        Thought Content: Thought content normal.        Judgment: Judgment normal.    Assessment/Plan: 1. Gastroesophageal reflux disease without esophagitis Stable continue to take prilosec as directed.   2. Hypercholesteremia Continue to take pravastatin.   3. Abnormal glucose level A1C is 5.0 - POCT HgB A1C  4. Cigarette nicotine dependence without complication Smoking cessation counseling: 1. Pt acknowledges the risks of long term smoking, she will try to quite smoking. 2. Options for different medications including nicotine products, chewing gum, patch etc, Wellbutrin and Chantix is discussed 3. Goal and date of compete cessation is discussed 4. Total time spent in smoking cessation is 15 min.    General Counseling: Steaven verbalizes understanding of the findings of todays visit and agrees with plan of treatment. I have discussed any further diagnostic evaluation that may be needed or ordered today. We also reviewed his medications today. he has been encouraged to call the office with any questions or concerns that should arise related to todays visit.   Orders Placed This Encounter  Procedures  . POCT HgB A1C    No orders of the defined types were placed in this encounter.   Time spent: 15 Minutes  This patient was seen by Orson Gear AGNP-C in Collaboration with Dr Lavera Guise as a part of collaborative care agreement.  Kendell Bane AGNP-C Internal Medicine

## 2018-12-30 ENCOUNTER — Ambulatory Visit: Payer: BLUE CROSS/BLUE SHIELD | Admitting: Urology

## 2018-12-30 LAB — CBC WITH DIFFERENTIAL/PLATELET
Basophils Absolute: 0 10*3/uL (ref 0.0–0.2)
Basos: 1 %
EOS (ABSOLUTE): 0.3 10*3/uL (ref 0.0–0.4)
Eos: 5 %
Hematocrit: 42.2 % (ref 37.5–51.0)
Hemoglobin: 13.5 g/dL (ref 13.0–17.7)
Immature Grans (Abs): 0 10*3/uL (ref 0.0–0.1)
Immature Granulocytes: 0 %
Lymphocytes Absolute: 1.7 10*3/uL (ref 0.7–3.1)
Lymphs: 32 %
MCH: 28.6 pg (ref 26.6–33.0)
MCHC: 32 g/dL (ref 31.5–35.7)
MCV: 89 fL (ref 79–97)
Monocytes Absolute: 0.4 10*3/uL (ref 0.1–0.9)
Monocytes: 7 %
Neutrophils Absolute: 3.1 10*3/uL (ref 1.4–7.0)
Neutrophils: 55 %
Platelets: 91 10*3/uL — CL (ref 150–450)
RBC: 4.72 x10E6/uL (ref 4.14–5.80)
RDW: 14.4 % (ref 11.6–15.4)
WBC: 5.5 10*3/uL (ref 3.4–10.8)

## 2018-12-30 LAB — LIPID PANEL WITH LDL/HDL RATIO
Cholesterol, Total: 100 mg/dL (ref 100–199)
HDL: 34 mg/dL — ABNORMAL LOW (ref >39)
LDL Calculated: 56 mg/dL (ref 0–99)
LDl/HDL Ratio: 1.6 ratio (ref 0.0–3.6)
Triglycerides: 50 mg/dL (ref 0–149)
VLDL Cholesterol Cal: 10 mg/dL (ref 5–40)

## 2018-12-30 LAB — B12 AND FOLATE PANEL
Folate: 14.4 ng/mL (ref >3.0)
Vitamin B-12: 522 pg/mL (ref 232–1245)

## 2018-12-30 LAB — COMPREHENSIVE METABOLIC PANEL
ALT: 19 IU/L (ref 0–44)
AST: 17 IU/L (ref 0–40)
Albumin/Globulin Ratio: 2.2 (ref 1.2–2.2)
Albumin: 4.3 g/dL (ref 3.8–4.9)
Alkaline Phosphatase: 93 IU/L (ref 39–117)
BUN/Creatinine Ratio: 13 (ref 9–20)
BUN: 10 mg/dL (ref 6–24)
Bilirubin Total: 0.3 mg/dL (ref 0.0–1.2)
CO2: 24 mmol/L (ref 20–29)
Calcium: 9.4 mg/dL (ref 8.7–10.2)
Chloride: 105 mmol/L (ref 96–106)
Creatinine, Ser: 0.78 mg/dL (ref 0.76–1.27)
GFR calc Af Amer: 117 mL/min/{1.73_m2} (ref >59)
GFR calc non Af Amer: 101 mL/min/{1.73_m2} (ref >59)
Globulin, Total: 2 g/dL (ref 1.5–4.5)
Glucose: 95 mg/dL (ref 65–99)
Potassium: 4.1 mmol/L (ref 3.5–5.2)
Sodium: 141 mmol/L (ref 134–144)
Total Protein: 6.3 g/dL (ref 6.0–8.5)

## 2018-12-30 LAB — T4, FREE: Free T4: 0.83 ng/dL (ref 0.82–1.77)

## 2018-12-30 LAB — FERRITIN: Ferritin: 254 ng/mL (ref 30–400)

## 2018-12-30 LAB — VITAMIN D 25 HYDROXY (VIT D DEFICIENCY, FRACTURES): Vit D, 25-Hydroxy: 22 ng/mL — ABNORMAL LOW (ref 30.0–100.0)

## 2018-12-30 LAB — IRON AND TIBC
Iron Saturation: 28 % (ref 15–55)
Iron: 81 ug/dL (ref 38–169)
Total Iron Binding Capacity: 286 ug/dL (ref 250–450)
UIBC: 205 ug/dL (ref 111–343)

## 2018-12-30 LAB — TSH: TSH: 1.43 u[IU]/mL (ref 0.450–4.500)

## 2018-12-31 ENCOUNTER — Encounter: Payer: Self-pay | Admitting: Urology

## 2019-01-13 ENCOUNTER — Ambulatory Visit: Payer: BC Managed Care – PPO | Admitting: Urology

## 2019-02-03 ENCOUNTER — Other Ambulatory Visit: Payer: BC Managed Care – PPO

## 2019-02-03 ENCOUNTER — Other Ambulatory Visit: Payer: Self-pay

## 2019-02-03 DIAGNOSIS — C61 Malignant neoplasm of prostate: Secondary | ICD-10-CM

## 2019-02-04 LAB — PSA: Prostate Specific Ag, Serum: <0.1 ng/mL (ref 0.0–4.0)

## 2019-02-10 ENCOUNTER — Encounter: Payer: Self-pay | Admitting: Urology

## 2019-02-10 ENCOUNTER — Ambulatory Visit (INDEPENDENT_AMBULATORY_CARE_PROVIDER_SITE_OTHER): Payer: BC Managed Care – PPO | Admitting: Urology

## 2019-02-10 ENCOUNTER — Other Ambulatory Visit: Payer: Self-pay

## 2019-02-10 VITALS — BP 120/73 | HR 83 | Ht 67.0 in | Wt 151.0 lb

## 2019-02-10 DIAGNOSIS — N5231 Erectile dysfunction following radical prostatectomy: Secondary | ICD-10-CM | POA: Diagnosis not present

## 2019-02-10 DIAGNOSIS — C61 Malignant neoplasm of prostate: Secondary | ICD-10-CM | POA: Diagnosis not present

## 2019-02-10 DIAGNOSIS — N393 Stress incontinence (female) (male): Secondary | ICD-10-CM | POA: Diagnosis not present

## 2019-02-10 NOTE — Progress Notes (Signed)
02/10/2019 4:00 PM   Donald Mendoza Oct 22, 1960 [id number]  Referring provider: Ronnell Freshwater, NP [address] [place],  [place] [address]  Chief Complaint  Patient presents with  . Prostate Cancer    Follow up    HPI: 58 year old male with a history of prostate cancer status post radical prostatectomy on 05/27/2017 returns today for routine follow-up.  He is status post radical robotic prostatectomy on 05/27/2017. He underwent a full nerve sparing procedure on the left and partial nerve sparing on the right. Lymph node dissection was also performed.  Surgical pathology consistent with Gleason 4+3 prostate cancer, large volume of Gleason 4 component greater than 60% of the right nodule. No extracapsular extension, seminal vesicle invasion, bladder neck invasion. Margins were negative. Lymph nodes negative but the left-sided lymph node dissection was somewhat limited due to presence of mesh.  Preop PSA 5.2 in 02/2017.  Postoperatively, has remained undetectable as of 02/03/2019.  He reports today he has minimal stress incontinence.  He does wear an adult diaper to work as his new job requires significant physical activity.  He will leak a small amount with extreme heavy lifting.  On nights and weekends, he is completely dry.  He is not tried pads and is interested in pursuing this, had questions today about where to obtain these devices.  In terms of erections, he was previously using PDE 5 inhibitors.  He no longer needs this medication as achieving a spontaneous erections which are satisfactory without medication.  He has P5 inhibitors as backup.  He has no other complaints today.  He is happy with his urination and erectile function.   PMH: Past Medical History:  Diagnosis Date  . B12 deficiency 04/21/2017  . Cancer [facility]) 2018   Prostate Cancer  . Diverticulosis   . GERD (gastroesophageal reflux disease)   . Hypercholesteremia     Surgical History: Past Surgical  History:  Procedure Laterality Date  . COLONOSCOPY    . HERNIA REPAIR     10 years ago, [place]. umbilical hernia x 2  . PELVIC LYMPH NODE DISSECTION Bilateral 05/27/2017   Procedure: PELVIC LYMPH NODE DISSECTION;  Surgeon: Hollice Espy, MD;  Location: [facility] ORS;  Service: Urology;  Laterality: Bilateral;  . ROBOT ASSISTED LAPAROSCOPIC RADICAL PROSTATECTOMY N/A 05/27/2017   Procedure: ROBOTIC ASSISTED LAPAROSCOPIC RADICAL PROSTATECTOMY;  Surgeon: Hollice Espy, MD;  Location: [facility] ORS;  Service: Urology;  Laterality: N/A;    Home Medications:  Allergies as of 02/10/2019      Reactions   Sulfa Antibiotics Hives      Medication List       Accurate as of February 10, 2019 11:59 PM. If you have any questions, ask your nurse or doctor.        STOP taking these medications   latanoprost 0.005 % ophthalmic solution Commonly known as: XALATAN Stopped by: Hollice Espy, MD   omeprazole 20 MG capsule Commonly known as: PRILOSEC Stopped by: Hollice Espy, MD     TAKE these medications   pravastatin 40 MG tablet Commonly known as: PRAVACHOL Take 1 tablet (40 mg total) by mouth daily.   sildenafil 20 MG tablet Commonly known as: REVATIO TAKE 1 TO 5 TABLETS AS NEEDED PRIOR TO INTERCOURSE   vitamin B-12 1000 MCG tablet Commonly known as: CYANOCOBALAMIN Take 1 tablet (1,000 mcg total) by mouth daily.       Allergies:  Allergies  Allergen Reactions  . Sulfa Antibiotics Hives    Family History: Family History  Problem Relation Age of Onset  . Diabetes Mother   . Breast cancer Mother   . Arthritis Father   . Breast cancer Sister     Social History:  reports that he quit smoking about 7 months ago. His smoking use included cigarettes. He has a 17.00 pack-year smoking history. He has never used smokeless tobacco. He reports previous alcohol use. He reports that he does not use drugs.  ROS: UROLOGY Frequent Urination?: No Hard to postpone urination?: No Burning/pain  with urination?: No Get up at night to urinate?: No Leakage of urine?: No Urine stream starts and stops?: No Trouble starting stream?: No Do you have to strain to urinate?: No Blood in urine?: No Urinary tract infection?: No Sexually transmitted disease?: No Injury to kidneys or bladder?: No Painful intercourse?: No Weak stream?: No Erection problems?: No Penile pain?: No  Gastrointestinal Nausea?: No Vomiting?: No Indigestion/heartburn?: No Diarrhea?: No Constipation?: No  Constitutional Fever: No Night sweats?: No Weight loss?: No Fatigue?: No  Skin Skin rash/lesions?: No Itching?: No  Eyes Blurred vision?: No Double vision?: No  Ears/Nose/Throat Sore throat?: No Sinus problems?: No  Hematologic/Lymphatic Swollen glands?: No Easy bruising?: No  Cardiovascular Leg swelling?: No Chest pain?: No  Respiratory Cough?: No Shortness of breath?: No  Endocrine Excessive thirst?: No  Musculoskeletal Back pain?: No Joint pain?: No  Neurological Headaches?: No Dizziness?: No  Psychologic Depression?: No Anxiety?: No  Physical Exam: BP 120/73   Pulse 83   Ht 5\' 7"  (1.702 m)   Wt 151 lb (68.5 kg)   BMI 23.65 kg/m   Constitutional:  Alert and oriented, No acute distress. HEENT: [place] AT, moist mucus membranes.  Trachea midline, no masses. Cardiovascular: No clubbing, cyanosis, or edema. Respiratory: Normal respiratory effort, no increased work of breathing. Skin: No rashes, bruises or suspicious lesions. Neurologic: Grossly intact, no focal deficits, moving all 4 extremities. Psychiatric: Normal mood and affect.  Laboratory Data: Lab Results  Component Value Date   WBC 5.5 12/29/2018   HGB 13.5 12/29/2018   HCT 42.2 12/29/2018   MCV 89 12/29/2018   PLT 91 (LL) 12/29/2018    Lab Results  Component Value Date   CREATININE 0.78 12/29/2018    PSA as above  Lab Results  Component Value Date   HGBA1C 5.0 12/29/2018   HGBA1C 5.9  12/29/2018   Assessment & Plan:    1. Prostate cancer [facility]) Currently NED, PSA undetectable We will continue to monitor on a every 6 month basis, labs only in 6 months and MD visit in 1 year He is agreeable this plan - PSA; Future - PSA  2. Erectile dysfunction after radical prostatectomy Now having spontaneous erections without medication Has meds as needed Overall improving  3. Stress incontinence, male Minimal, we did discuss transition to male pads and other devices for strenuous activity He does report today that he has not been doing his physical therapy exercises, strongly encouraged him to resume these Overall he is not particularly bothered by this and not interested in any further surgical intervention   Return in about 1 year (around 02/10/2020) for lab only 6 months, MD in 1 year with PSA.  Hollice Espy, MD  [facility] [address] [place], [place] [address] [phone]

## 2019-02-11 ENCOUNTER — Ambulatory Visit: Payer: BC Managed Care – PPO | Admitting: Adult Health

## 2019-02-11 ENCOUNTER — Encounter: Payer: Self-pay | Admitting: Adult Health

## 2019-02-11 VITALS — BP 118/73 | HR 85 | Temp 98.0°F | Resp 16 | Ht 67.0 in | Wt 150.0 lb

## 2019-02-11 DIAGNOSIS — K219 Gastro-esophageal reflux disease without esophagitis: Secondary | ICD-10-CM | POA: Diagnosis not present

## 2019-02-11 DIAGNOSIS — C61 Malignant neoplasm of prostate: Secondary | ICD-10-CM

## 2019-02-11 DIAGNOSIS — F17210 Nicotine dependence, cigarettes, uncomplicated: Secondary | ICD-10-CM

## 2019-02-11 NOTE — Progress Notes (Signed)
[facility] [place], [place] [address]  Internal MEDICINE  Office Visit Note  Patient Name: Donald Mendoza  [id number]  [id number]  Date of Service: 02/11/2019  Chief Complaint  Patient presents with  . Medical Management of Chronic Issues    6 month follow up.   . Hyperlipidemia    HPI Patient is here for follow-up and lab review. Has no complaints today, still remains tired due to his work schedule but has made plans with his boss to cut down his hours. No complaints of chest pain, shortness of breath or dizziness. No issues with medications. Labs were reviewed with patient, platelet count is low but remains stable, no issues with bleeding.  Pt reports he is receiving calls from his insurance company about getting his A1C checked.  His A1C was done for screening, and he is not diabetic at this time.      Current Medication: Outpatient Encounter Medications as of 02/11/2019  Medication Sig  . latanoprost (XALATAN) 0.005 % ophthalmic solution Place 1 drop into both eyes at bedtime.  . pravastatin (PRAVACHOL) 40 MG tablet Take 1 tablet (40 mg total) by mouth daily.  . vitamin B-12 (CYANOCOBALAMIN) 1000 MCG tablet Take 1 tablet (1,000 mcg total) by mouth daily.  . [DISCONTINUED] sildenafil (REVATIO) 20 MG tablet TAKE 1 TO 5 TABLETS AS NEEDED PRIOR TO INTERCOURSE   No facility-administered encounter medications on file as of 02/11/2019.     Surgical History: Past Surgical History:  Procedure Laterality Date  . COLONOSCOPY    . HERNIA REPAIR     10 years ago, [place]. umbilical hernia x 2  . PELVIC LYMPH NODE DISSECTION Bilateral 05/27/2017   Procedure: PELVIC LYMPH NODE DISSECTION;  Surgeon: Hollice Espy, MD;  Location: [facility] ORS;  Service: Urology;  Laterality: Bilateral;  . ROBOT ASSISTED LAPAROSCOPIC RADICAL PROSTATECTOMY N/A 05/27/2017   Procedure: ROBOTIC ASSISTED LAPAROSCOPIC RADICAL PROSTATECTOMY;  Surgeon: Hollice Espy, MD;  Location: [facility] ORS;   Service: Urology;  Laterality: N/A;    Medical History: Past Medical History:  Diagnosis Date  . B12 deficiency 04/21/2017  . Cancer [facility]) 2018   Prostate Cancer  . Diverticulosis   . GERD (gastroesophageal reflux disease)   . Hypercholesteremia     Family History: Family History  Problem Relation Age of Onset  . Diabetes Mother   . Breast cancer Mother   . Arthritis Father   . Breast cancer Sister     Social History   Socioeconomic History  . Marital status: Single    Spouse name: Not on file  . Number of children: Not on file  . Years of education: Not on file  . Highest education level: Not on file  Occupational History  . Not on file  Social Needs  . Financial resource strain: Not on file  . Food insecurity    Worry: Not on file    Inability: Not on file  . Transportation needs    Medical: Not on file    Non-medical: Not on file  Tobacco Use  . Smoking status: Former Smoker    Packs/day: 0.50    Years: 34.00    Pack years: 17.00    Types: Cigarettes    Quit date: 06/15/2018    Years since quitting: 0.6  . Smokeless tobacco: Never Used  Substance and Sexual Activity  . Alcohol use: Not Currently  . Drug use: No  . Sexual activity: Yes  Lifestyle  . Physical activity  Days per week: Not on file    Minutes per session: Not on file  . Stress: Not on file  Relationships  . Social Herbalist on phone: Not on file    Gets together: Not on file    Attends religious service: Not on file    Active member of club or organization: Not on file    Attends meetings of clubs or organizations: Not on file    Relationship status: Not on file  . Intimate partner violence    Fear of current or ex partner: Not on file    Emotionally abused: Not on file    Physically abused: Not on file    Forced sexual activity: Not on file  Other Topics Concern  . Not on file  Social History Narrative  . Not on file      Review of Systems  Constitutional:  Negative.  Negative for chills, fatigue and unexpected weight change.  HENT: Negative.  Negative for congestion, rhinorrhea, sneezing and sore throat.   Eyes: Negative for redness.  Respiratory: Negative.  Negative for cough, chest tightness and shortness of breath.   Cardiovascular: Negative.  Negative for chest pain and palpitations.  Gastrointestinal: Negative.  Negative for abdominal pain, constipation, diarrhea, nausea and vomiting.  Endocrine: Negative.   Genitourinary: Negative.  Negative for dysuria and frequency.  Musculoskeletal: Negative.  Negative for arthralgias, back pain, joint swelling and neck pain.  Skin: Negative.  Negative for rash.  Allergic/Immunologic: Negative.   Neurological: Negative.  Negative for tremors and numbness.  Hematological: Negative for adenopathy. Does not bruise/bleed easily.  Psychiatric/Behavioral: Negative.  Negative for behavioral problems, sleep disturbance and suicidal ideas. The patient is not nervous/anxious.     Vital Signs: BP 118/73   Pulse 85   Temp 98 F (36.7 C)   Resp 16   Ht 5\' 7"  (1.702 m)   Wt 150 lb (68 kg)   SpO2 98%   BMI 23.49 kg/m    Physical Exam Vitals signs and nursing note reviewed.  Constitutional:      General: He is not in acute distress.    Appearance: He is well-developed. He is not diaphoretic.  HENT:     Head: Normocephalic and atraumatic.     Mouth/Throat:     Pharynx: No oropharyngeal exudate.  Eyes:     Pupils: Pupils are equal, round, and reactive to light.  Neck:     Musculoskeletal: Normal range of motion and neck supple.     Thyroid: No thyromegaly.     Vascular: No JVD.     Trachea: No tracheal deviation.  Cardiovascular:     Rate and Rhythm: Normal rate and regular rhythm.     Heart sounds: Normal heart sounds. No murmur. No friction rub. No gallop.   Pulmonary:     Effort: Pulmonary effort is normal. No respiratory distress.     Breath sounds: Normal breath sounds. No wheezing or  rales.  Chest:     Chest wall: No tenderness.  Abdominal:     Palpations: Abdomen is soft.  Musculoskeletal: Normal range of motion.  Lymphadenopathy:     Cervical: No cervical adenopathy.  Skin:    General: Skin is warm and dry.  Neurological:     Mental Status: He is alert and oriented to person, place, and time.     Cranial Nerves: No cranial nerve deficit.  Psychiatric:        Behavior: Behavior normal.  Thought Content: Thought content normal.        Judgment: Judgment normal.    Assessment/Plan: 1. Gastroesophageal reflux disease without esophagitis Stable,  Will continue present management at this time.   2. Cigarette nicotine dependence without complication Smoking cessation counseling: 1. Pt acknowledges the risks of long term smoking, she will try to quite smoking. 2. Options for different medications including nicotine products, chewing gum, patch etc, Wellbutrin and Chantix is discussed 3. Goal and date of compete cessation is discussed 4. Total time spent in smoking cessation is 15 min.  3. Prostate cancer [facility]) Doing well, last psa >0.1 will continue to follow up with Dr. Tasia Catchings.   General Counseling: Tarri Fuller verbalizes understanding of the findings of todays visit and agrees with plan of treatment. I have discussed any further diagnostic evaluation that may be needed or ordered today. We also reviewed his medications today. he has been encouraged to call the office with any questions or concerns that should arise related to todays visit.    Orders Placed This Encounter  Procedures  . POCT HgB A1C    No orders of the defined types were placed in this encounter.   Time spent: 20 Minutes   This patient was seen by Orson Gear AGNP-C in Collaboration with Dr Lavera Guise as a part of collaborative care agreement     Kendell Bane AGNP-C Internal medicine

## 2019-04-09 DIAGNOSIS — H401123 Primary open-angle glaucoma, left eye, severe stage: Secondary | ICD-10-CM | POA: Diagnosis not present

## 2019-04-20 ENCOUNTER — Other Ambulatory Visit: Payer: Self-pay

## 2019-04-20 DIAGNOSIS — E7800 Pure hypercholesterolemia, unspecified: Secondary | ICD-10-CM

## 2019-04-20 MED ORDER — PRAVASTATIN SODIUM 40 MG PO TABS: 40.0000 mg | ORAL_TABLET | Freq: Every day | ORAL | 5 refills | Status: DC

## 2019-05-01 ENCOUNTER — Other Ambulatory Visit: Payer: Self-pay

## 2019-05-01 ENCOUNTER — Inpatient Hospital Stay: Payer: BC Managed Care – PPO

## 2019-05-01 DIAGNOSIS — Z87891 Personal history of nicotine dependence: Secondary | ICD-10-CM | POA: Diagnosis not present

## 2019-05-01 DIAGNOSIS — C61 Malignant neoplasm of prostate: Secondary | ICD-10-CM

## 2019-05-01 DIAGNOSIS — D696 Thrombocytopenia, unspecified: Secondary | ICD-10-CM | POA: Diagnosis not present

## 2019-05-01 DIAGNOSIS — E538 Deficiency of other specified B group vitamins: Secondary | ICD-10-CM | POA: Diagnosis not present

## 2019-05-01 DIAGNOSIS — Z79899 Other long term (current) drug therapy: Secondary | ICD-10-CM | POA: Insufficient documentation

## 2019-05-01 LAB — COMPREHENSIVE METABOLIC PANEL
ALT: 20 U/L (ref 0–44)
AST: 17 U/L (ref 15–41)
Albumin: 4 g/dL (ref 3.5–5.0)
Alkaline Phosphatase: 87 U/L (ref 38–126)
Anion gap: 9 (ref 5–15)
BUN: 12 mg/dL (ref 6–20)
CO2: 24 mmol/L (ref 22–32)
Calcium: 9 mg/dL (ref 8.9–10.3)
Chloride: 106 mmol/L (ref 98–111)
Creatinine, Ser: 0.75 mg/dL (ref 0.61–1.24)
GFR calc Af Amer: >60 mL/min (ref >60)
GFR calc non Af Amer: >60 mL/min (ref >60)
Glucose, Bld: 115 mg/dL — ABNORMAL HIGH (ref 70–99)
Potassium: 3.7 mmol/L (ref 3.5–5.1)
Sodium: 139 mmol/L (ref 135–145)
Total Bilirubin: 0.5 mg/dL (ref 0.3–1.2)
Total Protein: 6.7 g/dL (ref 6.5–8.1)

## 2019-05-01 LAB — CBC WITH DIFFERENTIAL/PLATELET
Abs Immature Granulocytes: 0 10*3/uL (ref 0.00–0.07)
Band Neutrophils: 1 %
Basophils Absolute: 0 10*3/uL (ref 0.0–0.1)
Basophils Relative: 0 %
Eosinophils Absolute: 0.2 10*3/uL (ref 0.0–0.5)
Eosinophils Relative: 3 %
HCT: 41.8 % (ref 39.0–52.0)
Hemoglobin: 13.9 g/dL (ref 13.0–17.0)
Lymphocytes Relative: 40 %
Lymphs Abs: 2.3 10*3/uL (ref 0.7–4.0)
MCH: 29.1 pg (ref 26.0–34.0)
MCHC: 33.3 g/dL (ref 30.0–36.0)
MCV: 87.6 fL (ref 80.0–100.0)
Monocytes Absolute: 0.3 10*3/uL (ref 0.1–1.0)
Monocytes Relative: 6 %
Neutro Abs: 3 10*3/uL (ref 1.7–7.7)
Neutrophils Relative %: 50 %
Platelets: 101 10*3/uL — ABNORMAL LOW (ref 150–400)
RBC: 4.77 MIL/uL (ref 4.22–5.81)
RDW: 13.8 % (ref 11.5–15.5)
Smear Review: DECREASED
WBC: 5.8 10*3/uL (ref 4.0–10.5)
nRBC: 0 % (ref 0.0–0.2)

## 2019-05-01 LAB — PSA: Prostatic Specific Antigen: 0.01 ng/mL (ref 0.00–4.00)

## 2019-05-01 LAB — VITAMIN B12: Vitamin B-12: 329 pg/mL (ref 180–914)

## 2019-05-02 LAB — INTRINSIC FACTOR ANTIBODIES: Intrinsic Factor: 1 AU/mL (ref 0.0–1.1)

## 2019-05-04 ENCOUNTER — Encounter: Payer: Self-pay | Admitting: Oncology

## 2019-05-04 ENCOUNTER — Other Ambulatory Visit: Payer: BC Managed Care – PPO

## 2019-05-04 ENCOUNTER — Inpatient Hospital Stay: Payer: BC Managed Care – PPO | Attending: Oncology | Admitting: Oncology

## 2019-05-04 DIAGNOSIS — R634 Abnormal weight loss: Secondary | ICD-10-CM | POA: Diagnosis not present

## 2019-05-04 DIAGNOSIS — D696 Thrombocytopenia, unspecified: Secondary | ICD-10-CM | POA: Diagnosis not present

## 2019-05-04 DIAGNOSIS — E538 Deficiency of other specified B group vitamins: Secondary | ICD-10-CM

## 2019-05-04 DIAGNOSIS — Z87891 Personal history of nicotine dependence: Secondary | ICD-10-CM | POA: Diagnosis not present

## 2019-05-04 DIAGNOSIS — C61 Malignant neoplasm of prostate: Secondary | ICD-10-CM | POA: Diagnosis not present

## 2019-05-04 DIAGNOSIS — Z79899 Other long term (current) drug therapy: Secondary | ICD-10-CM | POA: Diagnosis not present

## 2019-05-04 MED ORDER — VITAMIN B-12 1000 MCG PO TABS: 1000.0000 ug | ORAL_TABLET | Freq: Every day | ORAL | 1 refills | Status: DC

## 2019-05-04 NOTE — Progress Notes (Signed)
HEMATOLOGY-ONCOLOGY TeleHEALTH VISIT PROGRESS NOTE  I connected with Donald Mendoza on 05/04/19 at 10:00 AM EST by video enabled telemedicine visit and verified that I am speaking with the correct person using two identifiers. I discussed the limitations, risks, security and privacy concerns of performing an evaluation and management service by telemedicine and the availability of in-person appointments. I also discussed with the patient that there may be a patient responsible charge related to this service. The patient expressed understanding and agreed to proceed.   Other persons participating in the visit and their role in the encounter:  None  Patient's location: After work Provider's location: office Chief Complaint: Follow-up for thrombocytopenia   INTERVAL HISTORY Donald Mendoza is a 58 y.o. male who has above history reviewed by me today presents for follow up visit for management of thrombocytopenia Problems and complaints are listed below:  Patient reports doing well.  He raises concern c weight loss. Patient informs me that he only weighs 155 pounds and is concerned.  Denies any night sweating, fever or chills. Appetite is fair. He drinks alcohol on occasions Review of Systems  Constitutional: Positive for unexpected weight change. Negative for appetite change, chills, fatigue and fever.  HENT:   Negative for hearing loss and voice change.   Eyes: Negative for eye problems and icterus.  Respiratory: Negative for chest tightness, cough and shortness of breath.   Cardiovascular: Negative for chest pain and leg swelling.  Gastrointestinal: Negative for abdominal distention and abdominal pain.  Endocrine: Negative for hot flashes.  Genitourinary: Negative for difficulty urinating, dysuria and frequency.   Musculoskeletal: Negative for arthralgias.  Skin: Negative for itching and rash.  Neurological: Negative for light-headedness and numbness.  Hematological: Negative for  adenopathy. Does not bruise/bleed easily.  Psychiatric/Behavioral: Negative for confusion.    Past Medical History:  Diagnosis Date  . B12 deficiency 04/21/2017  . Cancer [facility]) 2018   Prostate Cancer  . Diverticulosis   . GERD (gastroesophageal reflux disease)   . Hypercholesteremia    Past Surgical History:  Procedure Laterality Date  . COLONOSCOPY    . HERNIA REPAIR     10 years ago, [place]. umbilical hernia x 2  . PELVIC LYMPH NODE DISSECTION Bilateral 05/27/2017   Procedure: PELVIC LYMPH NODE DISSECTION;  Surgeon: Hollice Espy, MD;  Location: [facility] ORS;  Service: Urology;  Laterality: Bilateral;  . ROBOT ASSISTED LAPAROSCOPIC RADICAL PROSTATECTOMY N/A 05/27/2017   Procedure: ROBOTIC ASSISTED LAPAROSCOPIC RADICAL PROSTATECTOMY;  Surgeon: Hollice Espy, MD;  Location: [facility] ORS;  Service: Urology;  Laterality: N/A;    Family History  Problem Relation Age of Onset  . Diabetes Mother   . Breast cancer Mother   . Arthritis Father   . Breast cancer Sister     Social History   Socioeconomic History  . Marital status: Single    Spouse name: Not on file  . Number of children: Not on file  . Years of education: Not on file  . Highest education level: Not on file  Occupational History  . Not on file  Tobacco Use  . Smoking status: Former Smoker    Packs/day: 0.50    Years: 34.00    Pack years: 17.00    Types: Cigarettes    Quit date: 06/15/2018    Years since quitting: 0.8  . Smokeless tobacco: Never Used  Substance and Sexual Activity  . Alcohol use: Not Currently  . Drug use: No  . Sexual activity: Yes  Other Topics Concern  .  Not on file  Social History Narrative  . Not on file   Social Determinants of Health   Financial Resource Strain:   . Difficulty of Paying Living Expenses: Not on file  Food Insecurity:   . Worried About Charity fundraiser in the Last Year: Not on file  . Ran Out of Food in the Last Year: Not on file  Transportation Needs:   . Lack of  Transportation (Medical): Not on file  . Lack of Transportation (Non-Medical): Not on file  Physical Activity:   . Days of Exercise per Week: Not on file  . Minutes of Exercise per Session: Not on file  Stress:   . Feeling of Stress : Not on file  Social Connections:   . Frequency of Communication with Friends and Family: Not on file  . Frequency of Social Gatherings with Friends and Family: Not on file  . Attends Religious Services: Not on file  . Active Member of Clubs or Organizations: Not on file  . Attends Archivist Meetings: Not on file  . Marital Status: Not on file  Intimate Partner Violence:   . Fear of Current or Ex-Partner: Not on file  . Emotionally Abused: Not on file  . Physically Abused: Not on file  . Sexually Abused: Not on file    Current Outpatient Medications on File Prior to Visit  Medication Sig Dispense Refill  . latanoprost (XALATAN) 0.005 % ophthalmic solution Place 1 drop into both eyes at bedtime.    . pravastatin (PRAVACHOL) 40 MG tablet Take 1 tablet (40 mg total) by mouth daily. 30 tablet 5   No current facility-administered medications on file prior to visit.    Allergies  Allergen Reactions  . Sulfa Antibiotics Hives       Observations/Objective: Today's Vitals   05/04/19 0934  PainSc: 0-No pain   There is no height or weight on file to calculate BMI.  Physical Exam  Constitutional: He is oriented to person, place, and time. He appears distressed.  Neurological: He is alert and oriented to person, place, and time.  Psychiatric: Affect normal.    CBC    Component Value Date/Time   WBC 5.8 05/01/2019 1428   RBC 4.77 05/01/2019 1428   HGB 13.9 05/01/2019 1428   HGB 13.5 12/29/2018 1059   HCT 41.8 05/01/2019 1428   HCT 42.2 12/29/2018 1059   PLT 101 (L) 05/01/2019 1428   PLT 91 (LL) 12/29/2018 1059   MCV 87.6 05/01/2019 1428   MCV 89 12/29/2018 1059   MCV 88 04/23/2013 1658   MCH 29.1 05/01/2019 1428   MCHC 33.3  05/01/2019 1428   RDW 13.8 05/01/2019 1428   RDW 14.4 12/29/2018 1059   RDW 15.3 (H) 04/23/2013 1658   LYMPHSABS 2.3 05/01/2019 1428   LYMPHSABS 1.7 12/29/2018 1059   LYMPHSABS 1.3 04/23/2013 1658   MONOABS 0.3 05/01/2019 1428   MONOABS 0.6 04/23/2013 1658   EOSABS 0.2 05/01/2019 1428   EOSABS 0.3 12/29/2018 1059   EOSABS 0.1 04/23/2013 1658   BASOSABS 0.0 05/01/2019 1428   BASOSABS 0.0 12/29/2018 1059   BASOSABS 0.1 04/23/2013 1658    CMP     Component Value Date/Time   NA 139 05/01/2019 1428   NA 141 12/29/2018 1059   NA 140 04/24/2013 0458   K 3.7 05/01/2019 1428   K 3.8 04/24/2013 0458   CL 106 05/01/2019 1428   CL 115 (H) 04/24/2013 0458   CO2 24 05/01/2019 1428  CO2 20 (L) 04/24/2013 0458   GLUCOSE 115 (H) 05/01/2019 1428   GLUCOSE 113 (H) 04/24/2013 0458   BUN 12 05/01/2019 1428   BUN 10 12/29/2018 1059   BUN 14 04/24/2013 0458   CREATININE 0.75 05/01/2019 1428   CREATININE 0.90 04/24/2013 0458   CALCIUM 9.0 05/01/2019 1428   CALCIUM 8.3 (L) 04/24/2013 0458   PROT 6.7 05/01/2019 1428   PROT 6.3 12/29/2018 1059   PROT 7.0 04/24/2013 0458   ALBUMIN 4.0 05/01/2019 1428   ALBUMIN 4.3 12/29/2018 1059   ALBUMIN 3.7 04/24/2013 0458   AST 17 05/01/2019 1428   AST 29 04/24/2013 0458   ALT 20 05/01/2019 1428   ALT 37 04/24/2013 0458   ALKPHOS 87 05/01/2019 1428   ALKPHOS 104 04/24/2013 0458   BILITOT 0.5 05/01/2019 1428   BILITOT 0.3 12/29/2018 1059   BILITOT 0.3 04/24/2013 0458   GFRNONAA >60 05/01/2019 1428   GFRNONAA >60 04/24/2013 0458   GFRAA >60 05/01/2019 1428   GFRAA >60 04/24/2013 0458     Assessment and Plan: 1. Thrombocytopenia ([place])   2. B12 deficiency   3. Prostate cancer ([place])   4. Weight loss     Labs are reviewed and discussed with patient. , Cytopenia, platelet counts are stable.  Last visit.  Recommend patient continue to minimize alcohol consumption. Vitamin B12 deficiency, B12 level has been stable.  Recommend patient to continue  take oral vitamin B12 supplementation. Regarding to the weight loss symptoms he reports, I reviewed patient's weights asked for this visits 3 to 4 months ago.  His weight has stays stable, even has gained a few pounds  Discussed with patient that there might be difference between different scales.  I recommend patient to keep a log of his weight monthly and bring to clinic next time. Prostate cancer, he follows up with urologist.  PSA is stable.  Reviewed with patient. Follow Up Instructions: 4 months.   I discussed the assessment and treatment plan with the patient. The patient was provided an opportunity to ask questions and all were answered. The patient agreed with the plan and demonstrated an understanding of the instructions.  The patient was advised to call back or seek an in-person evaluation if the symptoms worsen or if the condition fails to improve as anticipated.   Earlie Server, MD 05/04/2019 8:01 PM

## 2019-05-04 NOTE — Progress Notes (Signed)
Patient verified using two identifiers for virtual visit via telephone today.  Patient is concerned about weight loss.

## 2019-05-05 LAB — ANTI-PARIETAL ANTIBODY: Parietal Cell Antibody-IgG: 1.1 Units (ref 0.0–20.0)

## 2019-07-10 DIAGNOSIS — Z20828 Contact with and (suspected) exposure to other viral communicable diseases: Secondary | ICD-10-CM | POA: Diagnosis not present

## 2019-07-30 DIAGNOSIS — Z23 Encounter for immunization: Secondary | ICD-10-CM | POA: Diagnosis not present

## 2019-08-10 ENCOUNTER — Telehealth: Payer: Self-pay

## 2019-08-10 ENCOUNTER — Other Ambulatory Visit: Payer: BC Managed Care – PPO

## 2019-08-10 ENCOUNTER — Other Ambulatory Visit: Payer: Self-pay

## 2019-08-10 DIAGNOSIS — C61 Malignant neoplasm of prostate: Secondary | ICD-10-CM | POA: Diagnosis not present

## 2019-08-10 NOTE — Telephone Encounter (Signed)
Confirmed appointment on 08/12/2019 and screened for covid. klh °

## 2019-08-11 ENCOUNTER — Telehealth: Payer: Self-pay | Admitting: *Deleted

## 2019-08-11 LAB — PSA: Prostate Specific Ag, Serum: <0.1 ng/mL (ref 0.0–4.0)

## 2019-08-11 NOTE — Telephone Encounter (Addendum)
Patient informed, voiced understanding.   ----- Message from Hollice Espy, MD sent at 08/11/2019 10:19 AM EDT ----- PSA remains undetectable  Hollice Espy, MD

## 2019-08-12 ENCOUNTER — Other Ambulatory Visit: Payer: Self-pay

## 2019-08-12 ENCOUNTER — Other Ambulatory Visit: Payer: Self-pay | Admitting: Urology

## 2019-08-12 ENCOUNTER — Ambulatory Visit: Payer: BC Managed Care – PPO | Admitting: Adult Health

## 2019-08-12 ENCOUNTER — Encounter: Payer: Self-pay | Admitting: Adult Health

## 2019-08-12 VITALS — BP 124/81 | HR 82 | Temp 97.4°F | Resp 16 | Ht 67.0 in | Wt 160.2 lb

## 2019-08-12 DIAGNOSIS — J3089 Other allergic rhinitis: Secondary | ICD-10-CM

## 2019-08-12 DIAGNOSIS — C61 Malignant neoplasm of prostate: Secondary | ICD-10-CM

## 2019-08-12 DIAGNOSIS — E7800 Pure hypercholesterolemia, unspecified: Secondary | ICD-10-CM | POA: Diagnosis not present

## 2019-08-12 DIAGNOSIS — K219 Gastro-esophageal reflux disease without esophagitis: Secondary | ICD-10-CM

## 2019-08-12 DIAGNOSIS — Z0001 Encounter for general adult medical examination with abnormal findings: Secondary | ICD-10-CM | POA: Diagnosis not present

## 2019-08-12 DIAGNOSIS — F17210 Nicotine dependence, cigarettes, uncomplicated: Secondary | ICD-10-CM

## 2019-08-12 MED ORDER — OMEPRAZOLE 20 MG PO CPDR: 20.0000 mg | DELAYED_RELEASE_CAPSULE | Freq: Every day | ORAL | 1 refills | Status: DC

## 2019-08-12 MED ORDER — MONTELUKAST SODIUM 10 MG PO TABS: 10.0000 mg | ORAL_TABLET | Freq: Every day | ORAL | 1 refills | Status: DC

## 2019-08-12 NOTE — Progress Notes (Signed)
[facility] - [facility] [place], [place] [address]  Internal MEDICINE  Office Visit Note  Patient Name: Donald Mendoza  [id number]  [id number]  Date of Service: 08/12/2019  Chief Complaint  Patient presents with  . Follow-up  . Gastroesophageal Reflux  . Hyperlipidemia    HPI  Pt is seen today for yearly physical.  He has a history of  GERD, and HLD.  He also has a history of Prostate cancer.  Overall he is doing well.  He does complain that his allergies are worse lately.  He has never taken medications for this and would like to try something at this time.  His acid reflux is well controlled with omeprazole.    Current Medication: Outpatient Encounter Medications as of 08/12/2019  Medication Sig  . latanoprost (XALATAN) 0.005 % ophthalmic solution Place 1 drop into both eyes at bedtime.  . pravastatin (PRAVACHOL) 40 MG tablet Take 1 tablet (40 mg total) by mouth daily.  . vitamin B-12 (CYANOCOBALAMIN) 1000 MCG tablet Take 1 tablet (1,000 mcg total) by mouth daily.  . montelukast (SINGULAIR) 10 MG tablet Take 1 tablet (10 mg total) by mouth at bedtime.  Marland Kitchen omeprazole (PRILOSEC) 20 MG capsule Take 1 capsule (20 mg total) by mouth daily.   No facility-administered encounter medications on file as of 08/12/2019.    Surgical History: Past Surgical History:  Procedure Laterality Date  . COLONOSCOPY    . HERNIA REPAIR     10 years ago, [place]. umbilical hernia x 2  . PELVIC LYMPH NODE DISSECTION Bilateral 05/27/2017   Procedure: PELVIC LYMPH NODE DISSECTION;  Surgeon: Hollice Espy, MD;  Location: [facility] ORS;  Service: Urology;  Laterality: Bilateral;  . ROBOT ASSISTED LAPAROSCOPIC RADICAL PROSTATECTOMY N/A 05/27/2017   Procedure: ROBOTIC ASSISTED LAPAROSCOPIC RADICAL PROSTATECTOMY;  Surgeon: Hollice Espy, MD;  Location: [facility] ORS;  Service: Urology;  Laterality: N/A;    Medical History: Past Medical History:  Diagnosis Date  . B12 deficiency 04/21/2017  . Cancer  [facility]) 2018   Prostate Cancer  . Diverticulosis   . GERD (gastroesophageal reflux disease)   . Hypercholesteremia     Family History: Family History  Problem Relation Age of Onset  . Diabetes Mother   . Breast cancer Mother   . Arthritis Father   . Breast cancer Sister     Social History   Socioeconomic History  . Marital status: Single    Spouse name: Not on file  . Number of children: Not on file  . Years of education: Not on file  . Highest education level: Not on file  Occupational History  . Not on file  Tobacco Use  . Smoking status: Former Smoker    Packs/day: 0.50    Years: 34.00    Pack years: 17.00    Types: Cigarettes    Quit date: 06/15/2018    Years since quitting: 1.1  . Smokeless tobacco: Never Used  Substance and Sexual Activity  . Alcohol use: Not Currently  . Drug use: No  . Sexual activity: Yes  Other Topics Concern  . Not on file  Social History Narrative  . Not on file   Social Determinants of Health   Financial Resource Strain:   . Difficulty of Paying Living Expenses:   Food Insecurity:   . Worried About Charity fundraiser in the Last Year:   . Arboriculturist in the Last Year:   Transportation Needs:   . Film/video editor (Medical):   Marland Kitchen  Lack of Transportation (Non-Medical):   Physical Activity:   . Days of Exercise per Week:   . Minutes of Exercise per Session:   Stress:   . Feeling of Stress :   Social Connections:   . Frequency of Communication with Friends and Family:   . Frequency of Social Gatherings with Friends and Family:   . Attends Religious Services:   . Active Member of Clubs or Organizations:   . Attends Archivist Meetings:   Marland Kitchen Marital Status:   Intimate Partner Violence:   . Fear of Current or Ex-Partner:   . Emotionally Abused:   Marland Kitchen Physically Abused:   . Sexually Abused:       Review of Systems  Constitutional: Negative.  Negative for chills, fatigue and unexpected weight change.  HENT:  Negative.  Negative for congestion, rhinorrhea, sneezing and sore throat.   Eyes: Negative for redness.  Respiratory: Negative.  Negative for cough, chest tightness and shortness of breath.   Cardiovascular: Negative.  Negative for chest pain and palpitations.  Gastrointestinal: Negative.  Negative for abdominal pain, constipation, diarrhea, nausea and vomiting.  Endocrine: Negative.   Genitourinary: Negative.  Negative for dysuria and frequency.  Musculoskeletal: Negative.  Negative for arthralgias, back pain, joint swelling and neck pain.  Skin: Negative.  Negative for rash.  Allergic/Immunologic: Negative.   Neurological: Negative.  Negative for tremors and numbness.  Hematological: Negative for adenopathy. Does not bruise/bleed easily.  Psychiatric/Behavioral: Negative.  Negative for behavioral problems, sleep disturbance and suicidal ideas. The patient is not nervous/anxious.     Vital Signs: BP 124/81   Pulse 82   Temp (!) 97.4 F (36.3 C)   Resp 16   Ht 5\' 7"  (1.702 m)   Wt 160 lb 3.2 oz (72.7 kg)   SpO2 97%   BMI 25.09 kg/m    Physical Exam Vitals and nursing note reviewed.  Constitutional:      General: He is not in acute distress.    Appearance: He is well-developed. He is not diaphoretic.  HENT:     Head: Normocephalic and atraumatic.     Mouth/Throat:     Pharynx: No oropharyngeal exudate.  Eyes:     Pupils: Pupils are equal, round, and reactive to light.  Neck:     Thyroid: No thyromegaly.     Vascular: No JVD.     Trachea: No tracheal deviation.  Cardiovascular:     Rate and Rhythm: Normal rate and regular rhythm.     Heart sounds: Normal heart sounds. No murmur. No friction rub. No gallop.   Pulmonary:     Effort: Pulmonary effort is normal. No respiratory distress.     Breath sounds: Normal breath sounds. No wheezing or rales.  Chest:     Chest wall: No tenderness.  Abdominal:     Palpations: Abdomen is soft.     Tenderness: There is no abdominal  tenderness. There is no guarding.  Musculoskeletal:        General: Normal range of motion.     Cervical back: Normal range of motion and neck supple.  Lymphadenopathy:     Cervical: No cervical adenopathy.  Skin:    General: Skin is warm and dry.  Neurological:     Mental Status: He is alert and oriented to person, place, and time.     Cranial Nerves: No cranial nerve deficit.  Psychiatric:        Behavior: Behavior normal.  Thought Content: Thought content normal.        Judgment: Judgment normal.    Assessment/Plan: 1. Encounter for general adult medical examination with abnormal findings Up to date on PHM.   2. Environmental and seasonal allergies Start Singulair nightly as discussed for allergy symptoms. - montelukast (SINGULAIR) 10 MG tablet; Take 1 tablet (10 mg total) by mouth at bedtime.  Dispense: 90 tablet; Refill: 1  3. Gastroesophageal reflux disease without esophagitis Continue  Prilosec as prescribed.  - omeprazole (PRILOSEC) 20 MG capsule; Take 1 capsule (20 mg total) by mouth daily.  Dispense: 90 capsule; Refill: 1  4. Hypercholesteremia Stable at last check.  Continue to monitor and take pravastatin as directed.   5. Prostate cancer ([place]) Continue to follow up with Dr. Erlene Quan. He continues to have some urinary leaking. Follow up as directed.    6. Cigarette nicotine dependence without complication Smoking cessation counseling: 1. Pt acknowledges the risks of long term smoking, she will try to quite smoking. 2. Options for different medications including nicotine products, chewing gum, patch etc, Wellbutrin and Chantix is discussed 3. Goal and date of compete cessation is discussed 4. Total time spent in smoking cessation is 15 min.   General Counseling: Donald Mendoza verbalizes understanding of the findings of todays visit and agrees with plan of treatment. I have discussed any further diagnostic evaluation that may be needed or ordered today. We also  reviewed his medications today. he has been encouraged to call the office with any questions or concerns that should arise related to todays visit.    No orders of the defined types were placed in this encounter.   Meds ordered this encounter  Medications  . omeprazole (PRILOSEC) 20 MG capsule    Sig: Take 1 capsule (20 mg total) by mouth daily.    Dispense:  90 capsule    Refill:  1  . montelukast (SINGULAIR) 10 MG tablet    Sig: Take 1 tablet (10 mg total) by mouth at bedtime.    Dispense:  90 tablet    Refill:  1    Time spent: 30 Minutes   This patient was seen by Orson Gear AGNP-C in Collaboration with Dr Lavera Guise as a part of collaborative care agreement     Kendell Bane AGNP-C Internal medicine

## 2019-08-13 ENCOUNTER — Telehealth: Payer: Self-pay | Admitting: Urology

## 2019-08-13 NOTE — Telephone Encounter (Signed)
Pt asking for refill of his Sildenafil. Please advise. Thanks

## 2019-08-17 DIAGNOSIS — H0014 Chalazion left upper eyelid: Secondary | ICD-10-CM | POA: Diagnosis not present

## 2019-08-25 ENCOUNTER — Other Ambulatory Visit: Payer: Self-pay

## 2019-08-25 ENCOUNTER — Inpatient Hospital Stay: Payer: BC Managed Care – PPO | Attending: Oncology

## 2019-08-25 DIAGNOSIS — K219 Gastro-esophageal reflux disease without esophagitis: Secondary | ICD-10-CM | POA: Diagnosis not present

## 2019-08-25 DIAGNOSIS — Z8261 Family history of arthritis: Secondary | ICD-10-CM | POA: Insufficient documentation

## 2019-08-25 DIAGNOSIS — Z803 Family history of malignant neoplasm of breast: Secondary | ICD-10-CM | POA: Diagnosis not present

## 2019-08-25 DIAGNOSIS — C61 Malignant neoplasm of prostate: Secondary | ICD-10-CM | POA: Insufficient documentation

## 2019-08-25 DIAGNOSIS — D693 Immune thrombocytopenic purpura: Secondary | ICD-10-CM | POA: Insufficient documentation

## 2019-08-25 DIAGNOSIS — D696 Thrombocytopenia, unspecified: Secondary | ICD-10-CM

## 2019-08-25 DIAGNOSIS — Z87891 Personal history of nicotine dependence: Secondary | ICD-10-CM | POA: Diagnosis not present

## 2019-08-25 DIAGNOSIS — E538 Deficiency of other specified B group vitamins: Secondary | ICD-10-CM | POA: Insufficient documentation

## 2019-08-25 DIAGNOSIS — Z9079 Acquired absence of other genital organ(s): Secondary | ICD-10-CM | POA: Diagnosis not present

## 2019-08-25 DIAGNOSIS — Z79899 Other long term (current) drug therapy: Secondary | ICD-10-CM | POA: Insufficient documentation

## 2019-08-25 DIAGNOSIS — E7800 Pure hypercholesterolemia, unspecified: Secondary | ICD-10-CM | POA: Insufficient documentation

## 2019-08-25 DIAGNOSIS — Z833 Family history of diabetes mellitus: Secondary | ICD-10-CM | POA: Insufficient documentation

## 2019-08-25 LAB — CBC WITH DIFFERENTIAL/PLATELET
Abs Immature Granulocytes: 0 10*3/uL (ref 0.00–0.07)
Basophils Absolute: 0.1 10*3/uL (ref 0.0–0.1)
Basophils Relative: 1 %
Eosinophils Absolute: 0.2 10*3/uL (ref 0.0–0.5)
Eosinophils Relative: 3 %
HCT: 41.5 % (ref 39.0–52.0)
Hemoglobin: 14 g/dL (ref 13.0–17.0)
Lymphocytes Relative: 43 %
Lymphs Abs: 2.8 10*3/uL (ref 0.7–4.0)
MCH: 29.6 pg (ref 26.0–34.0)
MCHC: 33.7 g/dL (ref 30.0–36.0)
MCV: 87.7 fL (ref 80.0–100.0)
Monocytes Absolute: 0.2 10*3/uL (ref 0.1–1.0)
Monocytes Relative: 3 %
Neutro Abs: 3.3 10*3/uL (ref 1.7–7.7)
Neutrophils Relative %: 50 %
Platelets: 102 10*3/uL — ABNORMAL LOW (ref 150–400)
RBC: 4.73 MIL/uL (ref 4.22–5.81)
RDW: 13.3 % (ref 11.5–15.5)
Smear Review: DECREASED
WBC: 6.5 10*3/uL (ref 4.0–10.5)
nRBC: 0 % (ref 0.0–0.2)

## 2019-08-25 LAB — VITAMIN B12: Vitamin B-12: 600 pg/mL (ref 180–914)

## 2019-08-25 LAB — COMPREHENSIVE METABOLIC PANEL
ALT: 26 U/L (ref 0–44)
AST: 23 U/L (ref 15–41)
Albumin: 4.1 g/dL (ref 3.5–5.0)
Alkaline Phosphatase: 95 U/L (ref 38–126)
Anion gap: 9 (ref 5–15)
BUN: 13 mg/dL (ref 6–20)
CO2: 25 mmol/L (ref 22–32)
Calcium: 8.8 mg/dL — ABNORMAL LOW (ref 8.9–10.3)
Chloride: 103 mmol/L (ref 98–111)
Creatinine, Ser: 0.85 mg/dL (ref 0.61–1.24)
GFR calc Af Amer: >60 mL/min (ref >60)
GFR calc non Af Amer: >60 mL/min (ref >60)
Glucose, Bld: 143 mg/dL — ABNORMAL HIGH (ref 70–99)
Potassium: 3.7 mmol/L (ref 3.5–5.1)
Sodium: 137 mmol/L (ref 135–145)
Total Bilirubin: 0.6 mg/dL (ref 0.3–1.2)
Total Protein: 7.1 g/dL (ref 6.5–8.1)

## 2019-08-26 ENCOUNTER — Encounter: Payer: Self-pay | Admitting: Oncology

## 2019-08-26 NOTE — Progress Notes (Signed)
Patient is coming in for follow up he is doing well no complaints and denies questions for provider

## 2019-08-27 ENCOUNTER — Other Ambulatory Visit: Payer: Self-pay

## 2019-08-27 ENCOUNTER — Inpatient Hospital Stay (HOSPITAL_BASED_OUTPATIENT_CLINIC_OR_DEPARTMENT_OTHER): Payer: BC Managed Care – PPO | Admitting: Oncology

## 2019-08-27 VITALS — BP 109/68 | HR 75 | Temp 96.4°F | Resp 18 | Wt 161.2 lb

## 2019-08-27 DIAGNOSIS — Z8261 Family history of arthritis: Secondary | ICD-10-CM | POA: Diagnosis not present

## 2019-08-27 DIAGNOSIS — E7800 Pure hypercholesterolemia, unspecified: Secondary | ICD-10-CM | POA: Diagnosis not present

## 2019-08-27 DIAGNOSIS — K219 Gastro-esophageal reflux disease without esophagitis: Secondary | ICD-10-CM | POA: Diagnosis not present

## 2019-08-27 DIAGNOSIS — Z803 Family history of malignant neoplasm of breast: Secondary | ICD-10-CM | POA: Diagnosis not present

## 2019-08-27 DIAGNOSIS — D693 Immune thrombocytopenic purpura: Secondary | ICD-10-CM | POA: Diagnosis not present

## 2019-08-27 DIAGNOSIS — Z79899 Other long term (current) drug therapy: Secondary | ICD-10-CM | POA: Diagnosis not present

## 2019-08-27 DIAGNOSIS — Z87891 Personal history of nicotine dependence: Secondary | ICD-10-CM | POA: Diagnosis not present

## 2019-08-27 DIAGNOSIS — C61 Malignant neoplasm of prostate: Secondary | ICD-10-CM | POA: Diagnosis not present

## 2019-08-27 DIAGNOSIS — E538 Deficiency of other specified B group vitamins: Secondary | ICD-10-CM | POA: Diagnosis not present

## 2019-08-27 DIAGNOSIS — Z9079 Acquired absence of other genital organ(s): Secondary | ICD-10-CM | POA: Diagnosis not present

## 2019-08-27 DIAGNOSIS — D696 Thrombocytopenia, unspecified: Secondary | ICD-10-CM

## 2019-08-27 DIAGNOSIS — Z833 Family history of diabetes mellitus: Secondary | ICD-10-CM | POA: Diagnosis not present

## 2019-08-27 NOTE — Progress Notes (Signed)
Hematology/Oncology  Follow up note [facility] Telephone:(336) [phone] Fax:(336) [phone]   Patient Care Team: Ronnell Freshwater, NP as PCP - General (Family Medicine) Christie Nottingham, PA as Referring Physician (Physician Assistant) Christene Lye, MD (General Surgery) Earlie Server, MD as Consulting Physician (Oncology)  PURPOSE OF CONSULTATION:  Follow up of management of thrombocytopenia.  HISTORY OF PRESENTING ILLNESS:  Donald Mendoza is a  59 y.o.  male with PMH listed below who was referred to me for evaluation of thrombocytopenia. Patient was recently diagnosed with intermediate risk prostate cancer in the follows up with Dr. Erlene Quan. Initially he was diagnosed with a low-risk prostate cancer Gleason score 3+3 in March 2018 when his PSA was 4. PSA continued to rise to 5.2 and patient had a repeat prostate biopsy revealing Gleason score 4+3. Patient was scheduled for radical prostatectomy however was found to have thrombocytopenia. Referred to [place] to evaluation for the low platelet count. Patient reports that he has been told that his platelet has been low in the past. He never received any treatment.  # S/p RALP with BPLND 05/27/2017, pathology showed Gleason 4+3, pT2 N0, NEGATIVE FOR EXTRAPROSTATIC EXTENSION, SEMINAL VESICLE INVASION, AND BLADDER NECK INVASION. - MARGINS NEGATIVE FOR CARCINOMA.  # Prostate cancer pT2N0, PSA <0.1, no adverse features, , no adjuvant treatment was offered.  # 11/15/2017 bone marrow biopsy showed normocellular bone marrow for age with trilineage hematopoiesis.  Bone marrow showed megakaryocytes with predominantly normal morphology.  Suggesting peripheral destruction, sequestration or consumption of platelets. Bone marrow cytogenetics were normal. Discussed with patient about his bone marrow biopsy results.  His chronic thrombocytopenia most likely secondary to ITP, possibly due to underlying autoimmune process.    INTERVAL HISTORY  59 y.o. male with history of prostate cancer s/p prostectomy, ITP presents for follow-up. He reports feeling well.  No new complaints.  He follows up with urology and had PSA check in March 2021, PSA <0.1  Review of Systems  Constitutional: Negative for chills, fever, malaise/fatigue and weight loss.  HENT: Negative for sore throat.   Eyes: Negative for redness.  Respiratory: Negative for cough, shortness of breath and wheezing.   Cardiovascular: Negative for chest pain, palpitations and leg swelling.  Gastrointestinal: Negative for abdominal pain, blood in stool, nausea and vomiting.  Genitourinary: Negative for dysuria.  Musculoskeletal: Negative for myalgias.  Skin: Negative for rash.  Neurological: Negative for dizziness, tingling and tremors.  Endo/Heme/Allergies: Does not bruise/bleed easily.  Psychiatric/Behavioral: Negative for hallucinations.    MEDICAL HISTORY:  Past Medical History:  Diagnosis Date  . B12 deficiency 04/21/2017  . Cancer [facility]) 2018   Prostate Cancer  . Diverticulosis   . GERD (gastroesophageal reflux disease)   . Hypercholesteremia     SURGICAL HISTORY: Past Surgical History:  Procedure Laterality Date  . COLONOSCOPY    . HERNIA REPAIR     10 years ago, [place]. umbilical hernia x 2  . PELVIC LYMPH NODE DISSECTION Bilateral 05/27/2017   Procedure: PELVIC LYMPH NODE DISSECTION;  Surgeon: Hollice Espy, MD;  Location: [facility] ORS;  Service: Urology;  Laterality: Bilateral;  . ROBOT ASSISTED LAPAROSCOPIC RADICAL PROSTATECTOMY N/A 05/27/2017   Procedure: ROBOTIC ASSISTED LAPAROSCOPIC RADICAL PROSTATECTOMY;  Surgeon: Hollice Espy, MD;  Location: [facility] ORS;  Service: Urology;  Laterality: N/A;    SOCIAL HISTORY: Social History   Socioeconomic History  . Marital status: Single    Spouse name: Not on file  . Number of children: Not on file  . Years  of education: Not on file  . Highest education level: Not on file  Occupational History  . Not on file   Tobacco Use  . Smoking status: Former Smoker    Packs/day: 0.50    Years: 34.00    Pack years: 17.00    Types: Cigarettes    Quit date: 06/15/2018    Years since quitting: 1.2  . Smokeless tobacco: Never Used  Substance and Sexual Activity  . Alcohol use: Not Currently  . Drug use: No  . Sexual activity: Yes  Other Topics Concern  . Not on file  Social History Narrative  . Not on file   Social Determinants of Health   Financial Resource Strain:   . Difficulty of Paying Living Expenses:   Food Insecurity:   . Worried About Charity fundraiser in the Last Year:   . Arboriculturist in the Last Year:   Transportation Needs:   . Film/video editor (Medical):   Marland Kitchen Lack of Transportation (Non-Medical):   Physical Activity:   . Days of Exercise per Week:   . Minutes of Exercise per Session:   Stress:   . Feeling of Stress :   Social Connections:   . Frequency of Communication with Friends and Family:   . Frequency of Social Gatherings with Friends and Family:   . Attends Religious Services:   . Active Member of Clubs or Organizations:   . Attends Archivist Meetings:   Marland Kitchen Marital Status:   Intimate Partner Violence:   . Fear of Current or Ex-Partner:   . Emotionally Abused:   Marland Kitchen Physically Abused:   . Sexually Abused:     FAMILY HISTORY: Family History  Problem Relation Age of Onset  . Diabetes Mother   . Breast cancer Mother   . Arthritis Father   . Breast cancer Sister     ALLERGIES:  is allergic to sulfa antibiotics.  MEDICATIONS:  Current Outpatient Medications  Medication Sig Dispense Refill  . doxycycline (MONODOX) 100 MG capsule Take 100 mg by mouth 2 (two) times daily.    Marland Kitchen erythromycin ophthalmic ointment APPLY A SMALL AMOUNT INTO AFFECTED EYE AT BEDTIME    . latanoprost (XALATAN) 0.005 % ophthalmic solution Place 1 drop into both eyes at bedtime.    . montelukast (SINGULAIR) 10 MG tablet Take 1 tablet (10 mg total) by mouth at bedtime. 90  tablet 1  . omeprazole (PRILOSEC) 20 MG capsule Take 1 capsule (20 mg total) by mouth daily. 90 capsule 1  . pravastatin (PRAVACHOL) 40 MG tablet Take 1 tablet (40 mg total) by mouth daily. 30 tablet 5  . sildenafil (REVATIO) 20 MG tablet TAKE 1 TO 5 TABLETS AS NEEDED PRIOR TO INTERCOURSE 30 tablet 6  . vitamin B-12 (CYANOCOBALAMIN) 1000 MCG tablet Take 1 tablet (1,000 mcg total) by mouth daily. 90 tablet 1   No current facility-administered medications for this visit.     PHYSICAL EXAMINATION: ECOG PERFORMANCE STATUS: 0 - Asymptomatic Vitals:   08/27/19 1023  BP: 109/68  Pulse: 75  Resp: 18  Temp: (!) 96.4 F (35.8 C)   Filed Weights   08/27/19 1023  Weight: 161 lb 3.2 oz (73.1 kg)    Physical Exam  Constitutional: He is oriented to person, place, and time. No distress.  HENT:  Head: Normocephalic and atraumatic.  Mouth/Throat: No oropharyngeal exudate.  Eyes: Pupils are equal, round, and reactive to light. EOM are normal. No scleral icterus.  Cardiovascular: Normal  rate and regular rhythm.  No murmur heard. Pulmonary/Chest: Effort normal. No respiratory distress. He has no rales. He exhibits no tenderness.  Abdominal: Soft. He exhibits no distension. There is no abdominal tenderness.  Musculoskeletal:        General: No edema. Normal range of motion.     Cervical back: Normal range of motion and neck supple.  Neurological: He is alert and oriented to person, place, and time. No cranial nerve deficit. He exhibits normal muscle tone. Coordination normal.  Skin: Skin is warm and dry. He is not diaphoretic. No erythema.  Psychiatric: Affect normal.     LABORATORY DATA:  I have reviewed the data as listed Lab Results  Component Value Date   WBC 6.5 08/25/2019   HGB 14.0 08/25/2019   HCT 41.5 08/25/2019   MCV 87.7 08/25/2019   PLT 102 (L) 08/25/2019   Recent Labs    12/29/18 1059 05/01/19 1428 08/25/19 1435  NA 141 139 137  K 4.1 3.7 3.7  CL 105 106 103  CO2  24 24 25   GLUCOSE 95 115* 143*  BUN 10 12 13   CREATININE 0.78 0.75 0.85  CALCIUM 9.4 9.0 8.8*  GFRNONAA 101 >60 >60  GFRAA 117 >60 >60  PROT 6.3 6.7 7.1  ALBUMIN 4.3 4.0 4.1  AST 17 17 23   ALT 19 20 26   ALKPHOS 93 87 95  BILITOT 0.3 0.5 0.6    RADIOGRAPHIC STUDIES: I have personally reviewed the radiological images as listed and agreed with the findings in the report. US Abdomen showed no liver parachyema changes or splenomegaly.    ASSESSMENT & PLAN:  1. Thrombocytopenia ([place])   2. Prostate cancer ([place])      # Chronic thrombocytopenia:  ITP,  Labs are reviewed and discussed with patient. Stable platelet.  He has just received 1st dose of COVID vaccination.  I will check another CBC in 3 months.    Marland Kitchen #Prostate cancer, pT2(m)  pN0. PSA 5.2, gleason score 4+3, grade group 3--Stage IIC, unfavorable intermediate risk group,  status post prostatectomy BPLND .   PSA is less than 0.1.   # History of vitamin B12 deficiency, B12 has improved to 600.s/p IM vitamin injections.   Return of visit: 6 months.  We spent sufficient time to discuss many aspect of care, questions were answered to patient's satisfaction.  Earlie Server, MD, PhD 08/27/2019

## 2019-08-28 ENCOUNTER — Other Ambulatory Visit: Payer: BC Managed Care – PPO

## 2019-08-31 ENCOUNTER — Ambulatory Visit: Payer: BC Managed Care – PPO | Admitting: Oncology

## 2019-09-02 DIAGNOSIS — Z23 Encounter for immunization: Secondary | ICD-10-CM | POA: Diagnosis not present

## 2019-09-03 DIAGNOSIS — H0014 Chalazion left upper eyelid: Secondary | ICD-10-CM | POA: Diagnosis not present

## 2019-10-07 DIAGNOSIS — H401123 Primary open-angle glaucoma, left eye, severe stage: Secondary | ICD-10-CM | POA: Diagnosis not present

## 2019-10-17 ENCOUNTER — Other Ambulatory Visit: Payer: Self-pay | Admitting: Adult Health

## 2019-10-17 DIAGNOSIS — E7800 Pure hypercholesterolemia, unspecified: Secondary | ICD-10-CM

## 2019-11-09 ENCOUNTER — Other Ambulatory Visit: Payer: Self-pay | Admitting: *Deleted

## 2019-11-12 MED ORDER — VITAMIN B-12 1000 MCG PO TABS: 1000.0000 ug | ORAL_TABLET | Freq: Every day | ORAL | 1 refills | Status: DC

## 2019-11-26 ENCOUNTER — Inpatient Hospital Stay: Payer: BC Managed Care – PPO

## 2019-11-27 ENCOUNTER — Other Ambulatory Visit: Payer: Self-pay

## 2019-11-27 ENCOUNTER — Inpatient Hospital Stay: Payer: BC Managed Care – PPO | Attending: Oncology

## 2019-11-27 DIAGNOSIS — D696 Thrombocytopenia, unspecified: Secondary | ICD-10-CM | POA: Diagnosis not present

## 2019-11-27 LAB — CBC WITH DIFFERENTIAL/PLATELET
Abs Immature Granulocytes: 0.02 10*3/uL (ref 0.00–0.07)
Basophils Absolute: 0 10*3/uL (ref 0.0–0.1)
Basophils Relative: 0 %
Eosinophils Absolute: 0.3 10*3/uL (ref 0.0–0.5)
Eosinophils Relative: 5 %
HCT: 39.4 % (ref 39.0–52.0)
Hemoglobin: 13.4 g/dL (ref 13.0–17.0)
Immature Granulocytes: 0 %
Lymphocytes Relative: 31 %
Lymphs Abs: 1.7 10*3/uL (ref 0.7–4.0)
MCH: 29.1 pg (ref 26.0–34.0)
MCHC: 34 g/dL (ref 30.0–36.0)
MCV: 85.5 fL (ref 80.0–100.0)
Monocytes Absolute: 0.3 10*3/uL (ref 0.1–1.0)
Monocytes Relative: 5 %
Neutro Abs: 3.2 10*3/uL (ref 1.7–7.7)
Neutrophils Relative %: 59 %
Platelets: 90 10*3/uL — ABNORMAL LOW (ref 150–400)
RBC: 4.61 MIL/uL (ref 4.22–5.81)
RDW: 13.8 % (ref 11.5–15.5)
WBC: 5.5 10*3/uL (ref 4.0–10.5)
nRBC: 0 % (ref 0.0–0.2)

## 2019-12-03 DIAGNOSIS — H0012 Chalazion right lower eyelid: Secondary | ICD-10-CM | POA: Diagnosis not present

## 2020-01-27 DIAGNOSIS — H0012 Chalazion right lower eyelid: Secondary | ICD-10-CM | POA: Diagnosis not present

## 2020-02-09 NOTE — Progress Notes (Signed)
02/10/2020 10:41 AM   Tarri Fuller Fredrich Birks [birth year] [id number]  Referring provider: Ronnell Freshwater, NP [address] [place],  [place] [address] Chief Complaint  Patient presents with  . Prostate Cancer    HPI: JAIMES ECKERT is a 59 y.o. male who returns for a 1 year follow up of prostate cancer, ED after radical prostatectomy ad stress incontinence.   He isstatus post radical robotic prostatectomy on 05/27/2017. He underwent a full nerve sparing procedure on the left and partial nerve sparing on the right. Lymph node dissection was also performed.  Surgical pathology consistent with Gleason 4+3 prostate cancer, large volume of Gleason 4 component greater than 60% of the right nodule. No extracapsular extension, seminal vesicle invasion, bladder neck invasion. Margins were negative. Lymph nodes negative but the left-sided lymph node dissection was somewhat limited due to presence of mesh.  Preop PSA 5.2 in 02/2017.Postoperatively, has remained undetectable as of 02/03/2019. PSA remain undetectable as of 08/10/2019.   In terms of erections, he was previously using PDE 5 inhibitors.  He no longer needs this medication as achieving a spontaneous erections which are satisfactory without medication.  He has P5 inhibitors as backup.  He is using sildenafil as needed for ED. He reports to urinary symptoms. No leakage. He has weaned himself off of pads.   PMH: Past Medical History:  Diagnosis Date  . B12 deficiency 04/21/2017  . Cancer [facility]) 2018   Prostate Cancer  . Diverticulosis   . GERD (gastroesophageal reflux disease)   . Glaucoma   . Hypercholesteremia     Surgical History: Past Surgical History:  Procedure Laterality Date  . COLONOSCOPY    . HERNIA REPAIR     10 years ago, [place]. umbilical hernia x 2  . PELVIC LYMPH NODE DISSECTION Bilateral 05/27/2017   Procedure: PELVIC LYMPH NODE DISSECTION;  Surgeon: Hollice Espy, MD;  Location: [facility] ORS;  Service: Urology;   Laterality: Bilateral;  . ROBOT ASSISTED LAPAROSCOPIC RADICAL PROSTATECTOMY N/A 05/27/2017   Procedure: ROBOTIC ASSISTED LAPAROSCOPIC RADICAL PROSTATECTOMY;  Surgeon: Hollice Espy, MD;  Location: [facility] ORS;  Service: Urology;  Laterality: N/A;    Home Medications:  Allergies as of 02/10/2020      Reactions   Sulfa Antibiotics Hives      Medication List       Accurate as of February 10, 2020 11:59 PM. If you have any questions, ask your nurse or doctor.        STOP taking these medications   doxycycline 100 MG capsule Commonly known as: MONODOX Stopped by: Hollice Espy, MD   erythromycin ophthalmic ointment Stopped by: Hollice Espy, MD   montelukast 10 MG tablet Commonly known as: SINGULAIR Stopped by: Hollice Espy, MD   omeprazole 20 MG capsule Commonly known as: PRILOSEC Stopped by: Hollice Espy, MD   vitamin B-12 1000 MCG tablet Commonly known as: CYANOCOBALAMIN Stopped by: Hollice Espy, MD     TAKE these medications   latanoprost 0.005 % ophthalmic solution Commonly known as: XALATAN Place 1 drop into both eyes at bedtime.   pravastatin 40 MG tablet Commonly known as: PRAVACHOL TAKE 1 TABLET BY MOUTH EVERY DAY   sildenafil 20 MG tablet Commonly known as: REVATIO Take 1 tablet (20 mg total) by mouth as needed. What changed: See the new instructions. Changed by: Hollice Espy, MD       Allergies:  Allergies  Allergen Reactions  . Sulfa Antibiotics Hives    Family History: Family History  Problem Relation  Age of Onset  . Diabetes Mother   . Breast cancer Mother   . Arthritis Father   . Breast cancer Sister     Social History:  reports that he quit smoking about 19 months ago. His smoking use included cigarettes. He has a 17.00 pack-year smoking history. He has never used smokeless tobacco. He reports previous alcohol use. He reports that he does not use drugs.   Physical Exam: BP 129/86   Pulse (!) 56   Ht 5\' 7"  (1.702 m)   Wt  164 lb (74.4 kg)   BMI 25.69 kg/m   Constitutional:  Alert and oriented, No acute distress. HEENT: [place] AT, moist mucus membranes.  Trachea midline, no masses. Cardiovascular: No clubbing, cyanosis, or edema. Respiratory: Normal respiratory effort, no increased work of breathing. Skin: No rashes, bruises or suspicious lesions. Neurologic: Grossly intact, no focal deficits, moving all 4 extremities. Psychiatric: Normal mood and affect.  Laboratory Data:  Lab Results  Component Value Date   CREATININE 0.85 08/25/2019    Assessment & Plan:    1. Prostate cancer Currently NED and clinically doing well.  PSA remain undetectable as of 08/10/2019.  If PSA remains stable will begin to monitor PSA annually.   2. ED after radical prostatectomy Using sildenafil as needed.  Requesting refills x 1 year with 90 day supply -Sildenafil Rx sent to pharmacy.   3. Stress incontinence  Stable urinary symptoms. No longer needs to wear pads.    F/u 1 year with [facility] [address] [place], [place] [address] [phone]  I, Selena Batten, am acting as a scribe for Dr. Hollice Espy.  I have reviewed the above documentation for accuracy and completeness, and I agree with the above.   Hollice Espy, MD

## 2020-02-10 ENCOUNTER — Other Ambulatory Visit: Payer: Self-pay

## 2020-02-10 ENCOUNTER — Ambulatory Visit (INDEPENDENT_AMBULATORY_CARE_PROVIDER_SITE_OTHER): Payer: BC Managed Care – PPO | Admitting: Urology

## 2020-02-10 ENCOUNTER — Encounter: Payer: Self-pay | Admitting: Urology

## 2020-02-10 VITALS — BP 129/86 | HR 56 | Ht 67.0 in | Wt 164.0 lb

## 2020-02-10 DIAGNOSIS — C61 Malignant neoplasm of prostate: Secondary | ICD-10-CM | POA: Diagnosis not present

## 2020-02-10 DIAGNOSIS — N393 Stress incontinence (female) (male): Secondary | ICD-10-CM | POA: Diagnosis not present

## 2020-02-10 MED ORDER — SILDENAFIL CITRATE 20 MG PO TABS: 20.0000 mg | ORAL_TABLET | ORAL | 4 refills | Status: DC | PRN

## 2020-02-11 ENCOUNTER — Other Ambulatory Visit: Payer: Self-pay

## 2020-02-11 ENCOUNTER — Ambulatory Visit (INDEPENDENT_AMBULATORY_CARE_PROVIDER_SITE_OTHER): Payer: BC Managed Care – PPO | Admitting: Nurse Practitioner

## 2020-02-11 ENCOUNTER — Telehealth: Payer: Self-pay

## 2020-02-11 VITALS — BP 132/81 | HR 81 | Temp 97.9°F | Resp 16 | Ht 67.0 in | Wt 154.2 lb

## 2020-02-11 DIAGNOSIS — E7800 Pure hypercholesterolemia, unspecified: Secondary | ICD-10-CM

## 2020-02-11 DIAGNOSIS — C61 Malignant neoplasm of prostate: Secondary | ICD-10-CM | POA: Diagnosis not present

## 2020-02-11 DIAGNOSIS — R300 Dysuria: Secondary | ICD-10-CM | POA: Diagnosis not present

## 2020-02-11 DIAGNOSIS — R7309 Other abnormal glucose: Secondary | ICD-10-CM

## 2020-02-11 LAB — PSA: Prostate Specific Ag, Serum: <0.1 ng/mL (ref 0.0–4.0)

## 2020-02-11 NOTE — Telephone Encounter (Signed)
-----   Message from Hollice Espy, MD sent at 02/11/2020  7:51 AM EDT ----- PSA remains undetectable, awesome!  Hollice Espy

## 2020-02-11 NOTE — Telephone Encounter (Signed)
Tried to call patient, no answer. I did send pt my chart message

## 2020-02-11 NOTE — Progress Notes (Signed)
[facility], [place] [address]  Internal MEDICINE  Office Visit Note  Patient Name: Donald Mendoza  [id number]  [id number]  Date of Service: 02/28/2020  Chief Complaint  Patient presents with   Annual Exam    nose drainage   Hyperlipidemia   Gastroesophageal Reflux    The patient is here for routine follow up. He states that he is doing well. Blood pressure is good. He is due to have routine fasting labs done. Currently takes simvastatin and dose should be adjusted as indicated. He routinely sees urology due to prostate cancer. He is doing well. He continues to be cancer free. He has no new concerns or complaints today.       Current Medication: Outpatient Encounter Medications as of 02/11/2020  Medication Sig   latanoprost (XALATAN) 0.005 % ophthalmic solution Place 1 drop into both eyes at bedtime.   pravastatin (PRAVACHOL) 40 MG tablet TAKE 1 TABLET BY MOUTH EVERY DAY   sildenafil (REVATIO) 20 MG tablet Take 1 tablet (20 mg total) by mouth as needed.   No facility-administered encounter medications on file as of 02/11/2020.    Surgical History: Past Surgical History:  Procedure Laterality Date   COLONOSCOPY     HERNIA REPAIR     10 years ago, [place]. umbilical hernia x 2   PELVIC LYMPH NODE DISSECTION Bilateral 05/27/2017   Procedure: PELVIC LYMPH NODE DISSECTION;  Surgeon: Hollice Espy, MD;  Location: [facility] ORS;  Service: Urology;  Laterality: Bilateral;   ROBOT ASSISTED LAPAROSCOPIC RADICAL PROSTATECTOMY N/A 05/27/2017   Procedure: ROBOTIC ASSISTED LAPAROSCOPIC RADICAL PROSTATECTOMY;  Surgeon: Hollice Espy, MD;  Location: [facility] OR[facility];  Service: Urology;  Laterality: N/A;    Medical History: Past Medical History:  Diagnosis Date   B12 deficiency 04/21/2017   Cancer ([place]) 2018   Prostate Cancer   Diverticulosis    GERD (gastroesophageal reflux disease)    Glaucoma    Hypercholesteremia     Family  History: Family History  Problem Relation Age of Onset   Diabetes Mother    Breast cancer Mother    Arthritis Father    Breast cancer Sister     Social History   Socioeconomic History   Marital status: Single    Spouse name: Not on file   Number of children: Not on file   Years of education: Not on file   Highest education level: Not on file  Occupational History   Not on file  Tobacco Use   Smoking status: Former Smoker    Packs/day: 0.50    Years: 34.00    Pack years: 17.00    Types: Cigarettes    Quit date: 06/15/2018    Years since quitting: 1.7   Smokeless tobacco: Never Used  Vaping Use   Vaping Use: Never used  Substance and Sexual Activity   Alcohol use: Not Currently   Drug use: No   Sexual activity: Yes  Other Topics Concern   Not on file  Social History Narrative   Not on file   Social Determinants of Health   Financial Resource Strain:    Difficulty of Paying Living Expenses: Not on file  Food Insecurity:    Worried About Charity fundraiser in the Last Year: Not on file   [employer] of Food in the Last Year: Not on file  Transportation Needs:    Lack of Transportation (Medical): Not on file   Lack of Transportation (Non-Medical): Not on file  Physical Activity:    Days of Exercise per Week: Not on file   Minutes of Exercise per Session: Not on file  Stress:    Feeling of Stress : Not on file  Social Connections:    Frequency of Communication with Friends and Family: Not on file   Frequency of Social Gatherings with Friends and Family: Not on file   Attends Religious Services: Not on file   Active Member of Clubs or Organizations: Not on file   Attends Archivist Meetings: Not on file   Marital Status: Not on file  Intimate Partner Violence:    Fear of Current or Ex-Partner: Not on file   Emotionally Abused: Not on file   Physically Abused: Not on file   Sexually Abused: Not on file      Review  of Systems  Constitutional: Negative for activity change, chills, fatigue and unexpected weight change.  HENT: Negative for congestion, postnasal drip, rhinorrhea, sneezing and sore throat.   Respiratory: Negative for cough, chest tightness, shortness of breath and wheezing.   Cardiovascular: Negative for chest pain and palpitations.  Gastrointestinal: Negative for abdominal pain, constipation, diarrhea, nausea and vomiting.  Endocrine: Negative for cold intolerance, heat intolerance, polydipsia and polyuria.  Musculoskeletal: Negative for arthralgias, back pain, joint swelling and neck pain.  Skin: Negative for rash.  Allergic/Immunologic: Negative for environmental allergies.  Neurological: Negative for dizziness, tremors, numbness and headaches.  Hematological: Negative for adenopathy. Does not bruise/bleed easily.  Psychiatric/Behavioral: Negative for behavioral problems (Depression), sleep disturbance and suicidal ideas. The patient is not nervous/anxious.     Today's Vitals   02/11/20 1538  BP: 132/81  Pulse: 81  Resp: 16  Temp: 97.9 F (36.6 C)  SpO2: 97%  Weight: 154 lb 3.2 oz (69.9 kg)  Height: 5\' 7"  (1.702 m)   Body mass index is 24.15 kg/m.  Physical Exam Vitals and nursing note reviewed.  Constitutional:      General: He is not in acute distress.    Appearance: Normal appearance. He is well-developed. He is not diaphoretic.  HENT:     Head: Normocephalic and atraumatic.     Nose: Nose normal.     Mouth/Throat:     Pharynx: No oropharyngeal exudate.  Eyes:     Pupils: Pupils are equal, round, and reactive to light.  Neck:     Thyroid: No thyromegaly.     Vascular: No carotid bruit or JVD.     Trachea: No tracheal deviation.  Cardiovascular:     Rate and Rhythm: Normal rate and regular rhythm.     Heart sounds: Normal heart sounds. No murmur heard.  No friction rub. No gallop.   Pulmonary:     Effort: Pulmonary effort is normal. No respiratory distress.      Breath sounds: Normal breath sounds. No wheezing or rales.  Chest:     Chest wall: No tenderness.  Abdominal:     Palpations: Abdomen is soft.  Musculoskeletal:        General: Normal range of motion.     Cervical back: Normal range of motion and neck supple.  Lymphadenopathy:     Cervical: No cervical adenopathy.  Skin:    General: Skin is warm and dry.  Neurological:     Mental Status: He is alert and oriented to person, place, and time.     Cranial Nerves: No cranial nerve deficit.  Psychiatric:        Mood and Affect: Mood normal.  Behavior: Behavior normal.        Thought Content: Thought content normal.        Judgment: Judgment normal.   Assessment/Plan: 1. Hypercholesteremia Check lipid panel and adjust simvastatin dose as indicated.   2. Elevated glucose level Stable without any medication. Check with labs.   3. Prostate cancer [facility]) Continue regular visits with urology and oncology as scheduled.   4. Dysuria - UA/M w/rflx Culture, Routine  General Counseling: Imran verbalizes understanding of the findings of todays visit and agrees with plan of treatment. I have discussed any further diagnostic evaluation that may be needed or ordered today. We also reviewed his medications today. he has been encouraged to call the office with any questions or concerns that should arise related to todays visit.  This patient was seen by Leretha Pol FNP Collaboration with Dr Lavera Guise as a part of collaborative care agreement  Orders Placed This Encounter  Procedures   Microscopic Examination   UA/M w/rflx Culture, Routine     Total time spent: 25 Minutes   Time spent includes review of chart, medications, test results, and follow up plan with the patient.      Dr Lavera Guise Internal medicine

## 2020-02-11 NOTE — Progress Notes (Deleted)
[facility] [place], [place] [address]  Internal MEDICINE  Office Visit Note  Patient Name: Donald Mendoza  [id number]  [id number]  Date of Service: 02/11/2020  Chief Complaint  Patient presents with  . Annual Exam    nose drainage  . Hyperlipidemia  . Gastroesophageal Reflux     HPI Pt is here for routine health maintenance examination  Current Medication: Outpatient Encounter Medications as of 02/11/2020  Medication Sig  . latanoprost (XALATAN) 0.005 % ophthalmic solution Place 1 drop into both eyes at bedtime.  . pravastatin (PRAVACHOL) 40 MG tablet TAKE 1 TABLET BY MOUTH EVERY DAY  . sildenafil (REVATIO) 20 MG tablet Take 1 tablet (20 mg total) by mouth as needed.   No facility-administered encounter medications on file as of 02/11/2020.    Surgical History: Past Surgical History:  Procedure Laterality Date  . COLONOSCOPY    . HERNIA REPAIR     10 years ago, [place]. umbilical hernia x 2  . PELVIC LYMPH NODE DISSECTION Bilateral 05/27/2017   Procedure: PELVIC LYMPH NODE DISSECTION;  Surgeon: Hollice Espy, MD;  Location: [facility] OR[facility];  Service: Urology;  Laterality: Bilateral;  . ROBOT ASSISTED LAPAROSCOPIC RADICAL PROSTATECTOMY N/A 05/27/2017   Procedure: ROBOTIC ASSISTED LAPAROSCOPIC RADICAL PROSTATECTOMY;  Surgeon: Hollice Espy, MD;  Location: [facility] OR[facility];  Service: Urology;  Laterality: N/A;    Medical History: Past Medical History:  Diagnosis Date  . B12 deficiency 04/21/2017  . Cancer [facility]) 2018   Prostate Cancer  . Diverticulosis   . GERD (gastroesophageal reflux disease)   . Glaucoma   . Hypercholesteremia     Family History: Family History  Problem Relation Age of Onset  . Diabetes Mother   . Breast cancer Mother   . Arthritis Father   . Breast cancer Sister       Review of Systems   Vital Signs: BP 132/81   Pulse 81   Temp 97.9 F (36.6 C)   Resp 16   Ht 5\' 7"  (1.702 m)   Wt 154 lb 3.2 oz (69.9 kg)   SpO2 97%    BMI 24.15 kg/m    Physical Exam   LABS: Recent Results (from the past 2160 hour(s))  CBC with Differential/Platelet     Status: Abnormal   Collection Time: 11/27/19  3:09 PM  Result Value Ref Range   WBC 5.5 4.0 - 10.5 K/uL    Comment: WHITE COUNT CONFIRMED ON SMEAR   RBC 4.61 4.22 - 5.81 MIL/uL   Hemoglobin 13.4 13.0 - 17.0 g/dL   HCT 39.4 39 - 52 %   MCV 85.5 80.0 - 100.0 fL   MCH 29.1 26.0 - 34.0 pg   MCHC 34.0 30.0 - 36.0 g/dL   RDW 13.8 11.5 - 15.5 %   Platelets 90 (L) 150 - 400 K/uL   nRBC 0.0 0.0 - 0.2 %   Neutrophils Relative % 59 %   Neutro Abs 3.2 1.7 - 7.7 K/uL   Lymphocytes Relative 31 %   Lymphs Abs 1.7 0.7 - 4.0 K/uL   Monocytes Relative 5 %   Monocytes Absolute 0.3 0 - 1 K/uL   Eosinophils Relative 5 %   Eosinophils Absolute 0.3 0 - 0 K/uL   Basophils Relative 0 %   Basophils Absolute 0.0 0 - 0 K/uL   WBC Morphology DIFF CONFIRMED BY MANUAL    Smear Review MORPHOLOGY UNREMARKABLE     Comment: PLATELETS APPEAR DECREASED Reviewed    Immature Granulocytes  0 %   Abs Immature Granulocytes 0.02 0.00 - 0.07 K/uL    Comment: Performed at [facility], [address] [place], [place] [address]  PSA     Status: None   Collection Time: 02/10/20  3:30 PM  Result Value Ref Range   Prostate Specific Ag, Serum <0.1 0.0 - 4.0 ng/mL    Comment: Roche ECLIA methodology. According to the American Urological Association, Serum PSA should decrease and remain at undetectable levels after radical prostatectomy. The AUA defines biochemical recurrence as an initial PSA value 0.2 ng/mL or greater followed by a subsequent confirmatory PSA value 0.2 ng/mL or greater. Values obtained with different assay methods or kits cannot be used interchangeably. Results cannot be interpreted as absolute evidence of the presence or absence of malignant disease.     Marland KitchenMAMM    Assessment/Plan:   General Counseling: Romulo verbalizes understanding of the findings of  todays visit and agrees with plan of treatment. I have discussed any further diagnostic evaluation that may be needed or ordered today. We also reviewed his medications today. he has been encouraged to call the office with any questions or concerns that should arise related to todays visit.    Counseling:    Orders Placed This Encounter  Procedures  . UA/M w/rflx Culture, Routine    No orders of the defined types were placed in this encounter.   Total time spent:*** Minutes  Time spent includes review of chart, medications, test results, and follow up plan with the patient.     Lavera Guise, MD  Internal Medicine

## 2020-02-12 ENCOUNTER — Ambulatory Visit: Payer: BC Managed Care – PPO

## 2020-02-12 ENCOUNTER — Ambulatory Visit: Payer: BC Managed Care – PPO | Attending: Internal Medicine

## 2020-02-12 DIAGNOSIS — Z23 Encounter for immunization: Secondary | ICD-10-CM

## 2020-02-12 LAB — UA/M W/RFLX CULTURE, ROUTINE
Bilirubin, UA: NEGATIVE
Glucose, UA: NEGATIVE
Ketones, UA: NEGATIVE
Leukocytes,UA: NEGATIVE
Nitrite, UA: NEGATIVE
Protein,UA: NEGATIVE
RBC, UA: NEGATIVE
Specific Gravity, UA: 1.02 (ref 1.005–1.030)
Urobilinogen, Ur: 0.2 mg/dL (ref 0.2–1.0)
pH, UA: 5.5 (ref 5.0–7.5)

## 2020-02-12 LAB — MICROSCOPIC EXAMINATION
Bacteria, UA: NONE SEEN
Casts: NONE SEEN /lpf
Epithelial Cells (non renal): NONE SEEN /hpf (ref 0–10)
RBC, Urine: NONE SEEN /hpf (ref 0–2)
WBC, UA: NONE SEEN /hpf (ref 0–5)

## 2020-02-12 NOTE — Progress Notes (Signed)
   Covid-19 Vaccination Clinic  Name:  Donald Mendoza    MRN: [id number] DOB: 04-May-1961  02/12/2020  Mr. Donald Mendoza was observed post Covid-19 immunization for 15 minutes without incident. He was provided with Vaccine Information Sheet and instruction to access the V-Safe system.   Mr. Donald Mendoza was instructed to call 911 with any severe reactions post vaccine: Marland Kitchen Difficulty breathing  . Swelling of face and throat  . A fast heartbeat  . A bad rash all over body  . Dizziness and weakness

## 2020-02-26 ENCOUNTER — Inpatient Hospital Stay: Payer: BC Managed Care – PPO | Attending: Oncology

## 2020-02-26 ENCOUNTER — Other Ambulatory Visit: Payer: Self-pay

## 2020-02-26 DIAGNOSIS — Z79899 Other long term (current) drug therapy: Secondary | ICD-10-CM | POA: Insufficient documentation

## 2020-02-26 DIAGNOSIS — E7800 Pure hypercholesterolemia, unspecified: Secondary | ICD-10-CM | POA: Diagnosis not present

## 2020-02-26 DIAGNOSIS — K219 Gastro-esophageal reflux disease without esophagitis: Secondary | ICD-10-CM | POA: Diagnosis not present

## 2020-02-26 DIAGNOSIS — Z9079 Acquired absence of other genital organ(s): Secondary | ICD-10-CM | POA: Diagnosis not present

## 2020-02-26 DIAGNOSIS — Z8546 Personal history of malignant neoplasm of prostate: Secondary | ICD-10-CM | POA: Insufficient documentation

## 2020-02-26 DIAGNOSIS — E538 Deficiency of other specified B group vitamins: Secondary | ICD-10-CM | POA: Diagnosis not present

## 2020-02-26 DIAGNOSIS — D693 Immune thrombocytopenic purpura: Secondary | ICD-10-CM | POA: Insufficient documentation

## 2020-02-26 DIAGNOSIS — Z833 Family history of diabetes mellitus: Secondary | ICD-10-CM | POA: Diagnosis not present

## 2020-02-26 DIAGNOSIS — Z803 Family history of malignant neoplasm of breast: Secondary | ICD-10-CM | POA: Insufficient documentation

## 2020-02-26 DIAGNOSIS — Z87891 Personal history of nicotine dependence: Secondary | ICD-10-CM | POA: Diagnosis not present

## 2020-02-26 DIAGNOSIS — D696 Thrombocytopenia, unspecified: Secondary | ICD-10-CM

## 2020-02-26 LAB — COMPREHENSIVE METABOLIC PANEL
ALT: 19 U/L (ref 0–44)
AST: 19 U/L (ref 15–41)
Albumin: 4 g/dL (ref 3.5–5.0)
Alkaline Phosphatase: 90 U/L (ref 38–126)
Anion gap: 5 (ref 5–15)
BUN: 11 mg/dL (ref 6–20)
CO2: 29 mmol/L (ref 22–32)
Calcium: 8.7 mg/dL — ABNORMAL LOW (ref 8.9–10.3)
Chloride: 105 mmol/L (ref 98–111)
Creatinine, Ser: 0.95 mg/dL (ref 0.61–1.24)
GFR, Estimated: >60 mL/min (ref >60)
Glucose, Bld: 113 mg/dL — ABNORMAL HIGH (ref 70–99)
Potassium: 4 mmol/L (ref 3.5–5.1)
Sodium: 139 mmol/L (ref 135–145)
Total Bilirubin: 0.8 mg/dL (ref 0.3–1.2)
Total Protein: 6.8 g/dL (ref 6.5–8.1)

## 2020-02-26 LAB — CBC WITH DIFFERENTIAL/PLATELET
Abs Immature Granulocytes: 0 10*3/uL (ref 0.00–0.07)
Basophils Absolute: 0 10*3/uL (ref 0.0–0.1)
Basophils Relative: 1 %
Eosinophils Absolute: 0.4 10*3/uL (ref 0.0–0.5)
Eosinophils Relative: 9 %
HCT: 42.3 % (ref 39.0–52.0)
Hemoglobin: 14.6 g/dL (ref 13.0–17.0)
Lymphocytes Relative: 15 %
Lymphs Abs: 0.7 10*3/uL (ref 0.7–4.0)
MCH: 29.6 pg (ref 26.0–34.0)
MCHC: 34.5 g/dL (ref 30.0–36.0)
MCV: 85.8 fL (ref 80.0–100.0)
Monocytes Absolute: 0.3 10*3/uL (ref 0.1–1.0)
Monocytes Relative: 7 %
Neutro Abs: 3.1 10*3/uL (ref 1.7–7.7)
Neutrophils Relative %: 68 %
Platelets: 100 10*3/uL — ABNORMAL LOW (ref 150–400)
RBC: 4.93 MIL/uL (ref 4.22–5.81)
RDW: 13.5 % (ref 11.5–15.5)
Smear Review: DECREASED
WBC: 4.6 10*3/uL (ref 4.0–10.5)
nRBC: 0 % (ref 0.0–0.2)

## 2020-02-26 LAB — VITAMIN B12: Vitamin B-12: 422 pg/mL (ref 180–914)

## 2020-02-28 ENCOUNTER — Encounter: Payer: Self-pay | Admitting: Nurse Practitioner

## 2020-02-29 ENCOUNTER — Inpatient Hospital Stay (HOSPITAL_BASED_OUTPATIENT_CLINIC_OR_DEPARTMENT_OTHER): Payer: BC Managed Care – PPO | Admitting: Oncology

## 2020-02-29 ENCOUNTER — Other Ambulatory Visit: Payer: Self-pay

## 2020-02-29 ENCOUNTER — Encounter: Payer: Self-pay | Admitting: Oncology

## 2020-02-29 VITALS — BP 105/81 | HR 74 | Temp 97.3°F | Resp 16 | Ht 67.0 in | Wt 151.0 lb

## 2020-02-29 DIAGNOSIS — Z8546 Personal history of malignant neoplasm of prostate: Secondary | ICD-10-CM | POA: Diagnosis not present

## 2020-02-29 DIAGNOSIS — C61 Malignant neoplasm of prostate: Secondary | ICD-10-CM

## 2020-02-29 DIAGNOSIS — Z833 Family history of diabetes mellitus: Secondary | ICD-10-CM | POA: Diagnosis not present

## 2020-02-29 DIAGNOSIS — K219 Gastro-esophageal reflux disease without esophagitis: Secondary | ICD-10-CM | POA: Diagnosis not present

## 2020-02-29 DIAGNOSIS — Z79899 Other long term (current) drug therapy: Secondary | ICD-10-CM | POA: Diagnosis not present

## 2020-02-29 DIAGNOSIS — E7800 Pure hypercholesterolemia, unspecified: Secondary | ICD-10-CM | POA: Diagnosis not present

## 2020-02-29 DIAGNOSIS — Z803 Family history of malignant neoplasm of breast: Secondary | ICD-10-CM | POA: Diagnosis not present

## 2020-02-29 DIAGNOSIS — D693 Immune thrombocytopenic purpura: Secondary | ICD-10-CM | POA: Diagnosis not present

## 2020-02-29 DIAGNOSIS — E538 Deficiency of other specified B group vitamins: Secondary | ICD-10-CM

## 2020-02-29 DIAGNOSIS — D696 Thrombocytopenia, unspecified: Secondary | ICD-10-CM | POA: Diagnosis not present

## 2020-02-29 DIAGNOSIS — Z9079 Acquired absence of other genital organ(s): Secondary | ICD-10-CM | POA: Diagnosis not present

## 2020-02-29 DIAGNOSIS — Z87891 Personal history of nicotine dependence: Secondary | ICD-10-CM | POA: Diagnosis not present

## 2020-02-29 MED ORDER — VITAMIN B-12 1000 MCG PO TABS: 1000.0000 ug | ORAL_TABLET | Freq: Every day | ORAL | 1 refills | Status: DC

## 2020-02-29 NOTE — Progress Notes (Signed)
Hematology/Oncology  Follow up note [facility] [phone] [phone]   Patient Care Team: Ronnell Freshwater, NP as PCP - General (Family Medicine) Christie Nottingham, PA as Referring Physician (Physician Assistant) Christene Lye, MD (General Surgery) Earlie Server, MD as Consulting Physician (Oncology)  PURPOSE OF CONSULTATION:  Follow up of management of thrombocytopenia.  HISTORY OF PRESENTING ILLNESS:  Donald Mendoza is a  59 y.o.  male with PMH listed below who was referred to me for evaluation of thrombocytopenia. Patient was recently diagnosed with intermediate risk prostate cancer in the follows up with Dr. Erlene Quan. Initially he was diagnosed with a low-risk prostate cancer Gleason score 3+3 in March 2018 when his PSA was 4. PSA continued to rise to 5.2 and patient had a repeat prostate biopsy revealing Gleason score 4+3. Patient was scheduled for radical prostatectomy however was found to have thrombocytopenia. Referred to [place] to evaluation for the low platelet count. Patient reports that he has been told that his platelet has been low in the past. He never received any treatment.  # S/p RALP with BPLND 05/27/2017, pathology showed Gleason 4+3, pT2 N0, NEGATIVE FOR EXTRAPROSTATIC EXTENSION, SEMINAL VESICLE INVASION, AND BLADDER NECK INVASION. - MARGINS NEGATIVE FOR CARCINOMA.  # Prostate cancer pT2N0, PSA <0.1, no adverse features, , no adjuvant treatment was offered.  # 11/15/2017 bone marrow biopsy showed normocellular bone marrow for age with trilineage hematopoiesis.  Bone marrow showed megakaryocytes with predominantly normal morphology.  Suggesting peripheral destruction, sequestration or consumption of platelets. Bone marrow cytogenetics were normal. Discussed with patient about his bone marrow biopsy results.  His chronic thrombocytopenia most likely secondary to ITP, possibly due to underlying autoimmune process.    INTERVAL  HISTORY 59 y.o. male with history of prostate cancer s/p prostectomy, ITP presents for follow-up. Denies hematochezia, hematuria, hematemesis, epistaxis, black tarry stool or easy bruising.  Last PSA was check in September 2021 PSA <0.1  Review of Systems  Constitutional: Negative for chills, fever, malaise/fatigue and weight loss.  HENT: Negative for sore throat.   Eyes: Negative for redness.  Respiratory: Negative for cough, shortness of breath and wheezing.   Cardiovascular: Negative for chest pain, palpitations and leg swelling.  Gastrointestinal: Negative for abdominal pain, blood in stool, nausea and vomiting.  Genitourinary: Negative for dysuria.  Musculoskeletal: Negative for myalgias.  Skin: Negative for rash.  Neurological: Negative for dizziness, tingling and tremors.  Endo/Heme/Allergies: Does not bruise/bleed easily.  Psychiatric/Behavioral: Negative for hallucinations.    MEDICAL HISTORY:  Past Medical History:  Diagnosis Date  . B12 deficiency 04/21/2017  . Cancer [facility]) 2018   Prostate Cancer  . Diverticulosis   . GERD (gastroesophageal reflux disease)   . Glaucoma   . Hypercholesteremia     SURGICAL HISTORY: Past Surgical History:  Procedure Laterality Date  . COLONOSCOPY    . HERNIA REPAIR     10 years ago, [place]. umbilical hernia x 2  . PELVIC LYMPH NODE DISSECTION Bilateral 05/27/2017   Procedure: PELVIC LYMPH NODE DISSECTION;  Surgeon: Hollice Espy, MD;  Location: [facility] ORS;  Service: Urology;  Laterality: Bilateral;  . ROBOT ASSISTED LAPAROSCOPIC RADICAL PROSTATECTOMY N/A 05/27/2017   Procedure: ROBOTIC ASSISTED LAPAROSCOPIC RADICAL PROSTATECTOMY;  Surgeon: Hollice Espy, MD;  Location: [facility] ORS;  Service: Urology;  Laterality: N/A;    SOCIAL HISTORY: Social History   Socioeconomic History  . Marital status: Single    Spouse name: Not on file  . Number of children: Not on file  .  Years of education: Not on file  . Highest education level: Not on  file  Occupational History  . Not on file  Tobacco Use  . Smoking status: Former Smoker    Packs/day: 0.50    Years: 34.00    Pack years: 17.00    Types: Cigarettes    Quit date: 06/15/2018    Years since quitting: 1.7  . Smokeless tobacco: Never Used  Vaping Use  . Vaping Use: Never used  Substance and Sexual Activity  . Alcohol use: Not Currently  . Drug use: No  . Sexual activity: Yes  Other Topics Concern  . Not on file  Social History Narrative  . Not on file   Social Determinants of Health   Financial Resource Strain:   . Difficulty of Paying Living Expenses: Not on file  Food Insecurity:   . Worried About Charity fundraiser in the Last Year: Not on file  . Ran Out of Food in the Last Year: Not on file  Transportation Needs:   . Lack of Transportation (Medical): Not on file  . Lack of Transportation (Non-Medical): Not on file  Physical Activity:   . Days of Exercise per Week: Not on file  . Minutes of Exercise per Session: Not on file  Stress:   . Feeling of Stress : Not on file  Social Connections:   . Frequency of Communication with Friends and Family: Not on file  . Frequency of Social Gatherings with Friends and Family: Not on file  . Attends Religious Services: Not on file  . Active Member of Clubs or Organizations: Not on file  . Attends Archivist Meetings: Not on file  . Marital Status: Not on file  Intimate Partner Violence:   . Fear of Current or Ex-Partner: Not on file  . Emotionally Abused: Not on file  . Physically Abused: Not on file  . Sexually Abused: Not on file    FAMILY HISTORY: Family History  Problem Relation Age of Onset  . Diabetes Mother   . Breast cancer Mother   . Arthritis Father   . Breast cancer Sister     ALLERGIES:  is allergic to sulfa antibiotics.  MEDICATIONS:  Current Outpatient Medications  Medication Sig Dispense Refill  . latanoprost (XALATAN) 0.005 % ophthalmic solution Place 1 drop into both eyes  at bedtime.    . pravastatin (PRAVACHOL) 40 MG tablet TAKE 1 TABLET BY MOUTH EVERY DAY 30 tablet 5  . sildenafil (REVATIO) 20 MG tablet Take 1 tablet (20 mg total) by mouth as needed. 90 tablet 4   No current facility-administered medications for this visit.     PHYSICAL EXAMINATION: ECOG PERFORMANCE STATUS: 0 - Asymptomatic Vitals:   02/29/20 1333  BP: 105/81  Pulse: 74  Resp: 16  Temp: (!) 97.3 F (36.3 C)  SpO2: 99%   Filed Weights   02/29/20 1333  Weight: 151 lb (68.5 kg)    Physical Exam Constitutional:      General: He is not in acute distress.    Appearance: He is not diaphoretic.  HENT:     Head: Normocephalic and atraumatic.     Mouth/Throat:     Pharynx: No oropharyngeal exudate.  Eyes:     General: No scleral icterus.    Pupils: Pupils are equal, round, and reactive to light.  Cardiovascular:     Rate and Rhythm: Normal rate and regular rhythm.     Heart sounds: No murmur heard.  Pulmonary:     Effort: Pulmonary effort is normal. No respiratory distress.     Breath sounds: No rales.  Chest:     Chest wall: No tenderness.  Abdominal:     General: There is no distension.     Palpations: Abdomen is soft.     Tenderness: There is no abdominal tenderness.  Musculoskeletal:        General: Normal range of motion.     Cervical back: Normal range of motion and neck supple.  Skin:    General: Skin is warm and dry.     Findings: No erythema.  Neurological:     Mental Status: He is alert and oriented to person, place, and time.     Cranial Nerves: No cranial nerve deficit.     Motor: No abnormal muscle tone.     Coordination: Coordination normal.  Psychiatric:        Mood and Affect: Affect normal.      LABORATORY DATA:  I have reviewed the data as listed Lab Results  Component Value Date   WBC 4.6 02/26/2020   HGB 14.6 02/26/2020   HCT 42.3 02/26/2020   MCV 85.8 02/26/2020   PLT 100 (L) 02/26/2020   Recent Labs    05/01/19 1428  08/25/19 1435 02/26/20 1336  NA 139 137 139  K 3.7 3.7 4.0  CL 106 103 105  CO2 24 25 29   GLUCOSE 115* 143* 113*  BUN 12 13 11   CREATININE 0.75 0.85 0.95  CALCIUM 9.0 8.8* 8.7*  GFRNONAA >60 >60 >60  GFRAA >60 >60  --   PROT 6.7 7.1 6.8  ALBUMIN 4.0 4.1 4.0  AST 17 23 19   ALT 20 26 19   ALKPHOS 87 95 90  BILITOT 0.5 0.6 0.8    RADIOGRAPHIC STUDIES: I have personally reviewed the radiological images as listed and agreed with the findings in the report. US Abdomen showed no liver parachyema changes or splenomegaly.    ASSESSMENT & PLAN:  1. Thrombocytopenia ([place])   2. B12 deficiency   3. Prostate cancer ([place])     # Chronic thrombocytopenia:  ITP,  Labs are reviewed and discussed with patient. Stable platelet   # Prostate Cancer pT2(m) pN0 PSA 5.2 gleason score 4+3, grade group 3--Stage IIC, unfavorable intermediate risk group. status post prostatectomy BPLND .   PSA <0.1  # History of vitamin B12 deficiency, B12 level is 422, recommend continue oral vitamin B12 1048mg  Return of visit: 6 months.  We spent sufficient time to discuss many aspect of care, questions were answered to patient's satisfaction.  ZEarlie Server MD, PhD 02/29/2020

## 2020-04-22 ENCOUNTER — Other Ambulatory Visit: Payer: Self-pay | Admitting: Adult Health

## 2020-04-22 DIAGNOSIS — E7800 Pure hypercholesterolemia, unspecified: Secondary | ICD-10-CM

## 2020-05-23 ENCOUNTER — Other Ambulatory Visit: Payer: Self-pay

## 2020-05-23 DIAGNOSIS — E7800 Pure hypercholesterolemia, unspecified: Secondary | ICD-10-CM

## 2020-05-23 MED ORDER — PRAVASTATIN SODIUM 40 MG PO TABS: 40.0000 mg | ORAL_TABLET | Freq: Every day | ORAL | 2 refills | Status: DC

## 2020-06-01 DIAGNOSIS — H401123 Primary open-angle glaucoma, left eye, severe stage: Secondary | ICD-10-CM | POA: Diagnosis not present

## 2020-06-30 DIAGNOSIS — H401123 Primary open-angle glaucoma, left eye, severe stage: Secondary | ICD-10-CM | POA: Diagnosis not present

## 2020-07-08 ENCOUNTER — Encounter: Payer: Self-pay | Admitting: Hospice and Palliative Medicine

## 2020-07-08 ENCOUNTER — Telehealth: Payer: Self-pay | Admitting: *Deleted

## 2020-07-08 ENCOUNTER — Telehealth: Payer: Self-pay

## 2020-07-08 ENCOUNTER — Other Ambulatory Visit: Payer: Self-pay | Admitting: Hospice and Palliative Medicine

## 2020-07-08 ENCOUNTER — Other Ambulatory Visit
Admission: RE | Admit: 2020-07-08 | Discharge: 2020-07-08 | Disposition: A | Discharge status: Home / Self Care | Payer: BC Managed Care – PPO | Source: Home / Self Care | Attending: Hospice and Palliative Medicine | Admitting: Hospice and Palliative Medicine

## 2020-07-08 ENCOUNTER — Ambulatory Visit
Admission: RE | Admit: 2020-07-08 | Discharge: 2020-07-08 | Disposition: A | Discharge status: Home / Self Care | Payer: BC Managed Care – PPO | Source: Ambulatory Visit | Attending: Hospice and Palliative Medicine | Admitting: Hospice and Palliative Medicine

## 2020-07-08 ENCOUNTER — Ambulatory Visit: Payer: BC Managed Care – PPO | Admitting: Hospice and Palliative Medicine

## 2020-07-08 ENCOUNTER — Other Ambulatory Visit: Payer: Self-pay

## 2020-07-08 VITALS — BP 123/80 | HR 74 | Temp 97.5°F | Resp 16 | Ht 67.0 in | Wt 158.6 lb

## 2020-07-08 DIAGNOSIS — R1033 Periumbilical pain: Secondary | ICD-10-CM | POA: Diagnosis not present

## 2020-07-08 DIAGNOSIS — C61 Malignant neoplasm of prostate: Secondary | ICD-10-CM

## 2020-07-08 LAB — COMPREHENSIVE METABOLIC PANEL
ALT: 21 U/L (ref 0–44)
AST: 18 U/L (ref 15–41)
Albumin: 3.9 g/dL (ref 3.5–5.0)
Alkaline Phosphatase: 85 U/L (ref 38–126)
Anion gap: 7 (ref 5–15)
BUN: 10 mg/dL (ref 6–20)
CO2: 29 mmol/L (ref 22–32)
Calcium: 9 mg/dL (ref 8.9–10.3)
Chloride: 101 mmol/L (ref 98–111)
Creatinine, Ser: 0.83 mg/dL (ref 0.61–1.24)
GFR, Estimated: >60 mL/min (ref >60)
Glucose, Bld: 107 mg/dL — ABNORMAL HIGH (ref 70–99)
Potassium: 3.9 mmol/L (ref 3.5–5.1)
Sodium: 137 mmol/L (ref 135–145)
Total Bilirubin: 0.4 mg/dL (ref 0.3–1.2)
Total Protein: 7 g/dL (ref 6.5–8.1)

## 2020-07-08 LAB — CBC WITH DIFFERENTIAL/PLATELET
Abs Immature Granulocytes: 0.01 10*3/uL (ref 0.00–0.07)
Basophils Absolute: 0 10*3/uL (ref 0.0–0.1)
Basophils Relative: 0 %
Eosinophils Absolute: 0.2 10*3/uL (ref 0.0–0.5)
Eosinophils Relative: 3 %
HCT: 43.1 % (ref 39.0–52.0)
Hemoglobin: 13.9 g/dL (ref 13.0–17.0)
Immature Granulocytes: 0 %
Lymphocytes Relative: 23 %
Lymphs Abs: 1.3 10*3/uL (ref 0.7–4.0)
MCH: 28.8 pg (ref 26.0–34.0)
MCHC: 32.3 g/dL (ref 30.0–36.0)
MCV: 89.4 fL (ref 80.0–100.0)
Monocytes Absolute: 0.4 10*3/uL (ref 0.1–1.0)
Monocytes Relative: 6 %
Neutro Abs: 3.7 10*3/uL (ref 1.7–7.7)
Neutrophils Relative %: 68 %
Platelets: 92 10*3/uL — ABNORMAL LOW (ref 150–400)
RBC: 4.82 MIL/uL (ref 4.22–5.81)
RDW: 13.7 % (ref 11.5–15.5)
WBC: 5.5 10*3/uL (ref 4.0–10.5)
nRBC: 0 % (ref 0.0–0.2)

## 2020-07-08 MED ORDER — OMEPRAZOLE 20 MG PO CPDR: 20.0000 mg | DELAYED_RELEASE_CAPSULE | Freq: Every day | ORAL | 3 refills | Status: DC

## 2020-07-08 NOTE — Telephone Encounter (Signed)
Patient called reporting that he pulled something yesterday at work and that he is now having pain near his navel and thinks he may now have a hernia. I advised patient to contact his PCP or to go to Urgent Care He agreed to call his PCP

## 2020-07-08 NOTE — Progress Notes (Signed)
[facility] [place], [place] [address]  Internal MEDICINE  Office Visit Note  Patient Name: Donald Mendoza  [id number]  [id number]  Date of Service: 07/10/2020  Chief Complaint  Patient presents with  . Acute Visit    Possible hernia, pulled muscle      HPI Pt is here for a sick visit. C/o umbilical abdominal pain Felt a sensation of muscle being pulled yesterday when he was lifting heavy objects, pain is directly below umbilicus History of umbilical hernia repair X2 Pain is non-radiating, not associated with N/V or diarrhea Does report alcohol consumption, not heavy consumption  Current Medication:  Outpatient Encounter Medications as of 07/08/2020  Medication Sig  . latanoprost (XALATAN) 0.005 % ophthalmic solution Place 1 drop into both eyes at bedtime.  . pravastatin (PRAVACHOL) 40 MG tablet Take 1 tablet (40 mg total) by mouth daily.  . sildenafil (REVATIO) 20 MG tablet Take 1 tablet (20 mg total) by mouth as needed.  . vitamin B-12 (CYANOCOBALAMIN) 1000 MCG tablet Take 1 tablet (1,000 mcg total) by mouth daily.   No facility-administered encounter medications on file as of 07/08/2020.      Medical History: Past Medical History:  Diagnosis Date  . B12 deficiency 04/21/2017  . Cancer [facility]) 2018   Prostate Cancer  . Diverticulosis   . GERD (gastroesophageal reflux disease)   . Glaucoma   . Hypercholesteremia      Vital Signs: BP 123/80   Pulse 74   Temp (!) 97.5 F (36.4 C)   Resp 16   Ht 5\' 7"  (1.702 m)   Wt 158 lb 9.6 oz (71.9 kg)   SpO2 96%   BMI 24.84 kg/m    Review of Systems  Constitutional: Negative for chills, fatigue and unexpected weight change.  HENT: Negative for congestion, postnasal drip, rhinorrhea, sneezing and sore throat.   Eyes: Negative for redness.  Respiratory: Negative for cough, chest tightness and shortness of breath.   Cardiovascular: Negative for chest pain and palpitations.   Gastrointestinal: Positive for abdominal pain. Negative for constipation, diarrhea, nausea and vomiting.  Genitourinary: Negative for dysuria and frequency.  Musculoskeletal: Negative for arthralgias, back pain, joint swelling and neck pain.  Skin: Negative for rash.  Neurological: Negative for tremors and numbness.  Hematological: Negative for adenopathy. Does not bruise/bleed easily.  Psychiatric/Behavioral: Negative for behavioral problems (Depression), sleep disturbance and suicidal ideas. The patient is not nervous/anxious.     Physical Exam Vitals reviewed.  Constitutional:      Appearance: Normal appearance. He is normal weight.  Cardiovascular:     Rate and Rhythm: Normal rate and regular rhythm.     Pulses: Normal pulses.     Heart sounds: Normal heart sounds.  Pulmonary:     Effort: Pulmonary effort is normal.     Breath sounds: Normal breath sounds.  Abdominal:     General: Abdomen is flat. Bowel sounds are normal.     Comments: Tenderness to palpation directly below umbilicus No visible hernia or hernia palpated  Musculoskeletal:        General: Normal range of motion.     Cervical back: Normal range of motion.  Skin:    General: Skin is warm.  Neurological:     General: No focal deficit present.     Mental Status: He is alert and oriented to person, place, and time. Mental status is at baseline.  Psychiatric:        Mood and Affect: Mood normal.  Behavior: Behavior normal.        Thought Content: Thought content normal.        Judgment: Judgment normal.    Assessment/Plan: 1. Umbilical pain Stat US and labs Differential include--hernia, pancreatitis, cholecystitis - CBC w/Diff/Platelet - Comprehensive Metabolic Panel (CMET) - US Abdomen Limited; Future  2. Prostate cancer [facility]) Followed by oncology  General Counseling: Dutch verbalizes understanding of the findings of todays visit and agrees with plan of treatment. I have discussed any further  diagnostic evaluation that may be needed or ordered today. We also reviewed his medications today. he has been encouraged to call the office with any questions or concerns that should arise related to todays visit.   Orders Placed This Encounter  Procedures  . US Abdomen Limited  . CBC w/Diff/Platelet  . Comprehensive Metabolic Panel (CMET)      Time spent: 25 Minutes  This patient was seen by Theodoro Grist AGNP-C in Collaboration with Dr Lavera Guise as a part of collaborative care agreement.  Tanna Furry [facility] Internal Medicine

## 2020-07-08 NOTE — Progress Notes (Signed)
Reviewed US--start omeprazole over the weekend, follow-up in office on Monday.

## 2020-07-08 NOTE — Telephone Encounter (Signed)
Incoming voicemail on triage line from patient seeking advice for stomach issues, patient did not go into further detail. Returned patients call, no answer. Advised him to return call should he still need advice.

## 2020-07-10 ENCOUNTER — Encounter: Payer: Self-pay | Admitting: Hospice and Palliative Medicine

## 2020-07-11 ENCOUNTER — Other Ambulatory Visit: Payer: Self-pay

## 2020-07-11 ENCOUNTER — Ambulatory Visit: Payer: BC Managed Care – PPO | Admitting: Physician Assistant

## 2020-08-02 ENCOUNTER — Other Ambulatory Visit: Payer: Self-pay | Admitting: Nurse Practitioner

## 2020-08-02 DIAGNOSIS — E782 Mixed hyperlipidemia: Secondary | ICD-10-CM | POA: Diagnosis not present

## 2020-08-02 DIAGNOSIS — R7301 Impaired fasting glucose: Secondary | ICD-10-CM | POA: Diagnosis not present

## 2020-08-02 DIAGNOSIS — Z0001 Encounter for general adult medical examination with abnormal findings: Secondary | ICD-10-CM | POA: Diagnosis not present

## 2020-08-04 LAB — COMPREHENSIVE METABOLIC PANEL
ALT: 17 IU/L (ref 0–44)
AST: 17 IU/L (ref 0–40)
Albumin/Globulin Ratio: 2 (ref 1.2–2.2)
Albumin: 4.4 g/dL (ref 3.8–4.9)
Alkaline Phosphatase: 109 IU/L (ref 44–121)
BUN/Creatinine Ratio: 9 (ref 9–20)
BUN: 9 mg/dL (ref 6–24)
Bilirubin Total: 0.3 mg/dL (ref 0.0–1.2)
CO2: 24 mmol/L (ref 20–29)
Calcium: 9.4 mg/dL (ref 8.7–10.2)
Chloride: 103 mmol/L (ref 96–106)
Creatinine, Ser: 1.01 mg/dL (ref 0.76–1.27)
Globulin, Total: 2.2 g/dL (ref 1.5–4.5)
Glucose: 104 mg/dL — ABNORMAL HIGH (ref 65–99)
Potassium: 4.1 mmol/L (ref 3.5–5.2)
Sodium: 142 mmol/L (ref 134–144)
Total Protein: 6.6 g/dL (ref 6.0–8.5)
eGFR: 87 mL/min/{1.73_m2} (ref >59)

## 2020-08-04 LAB — CBC
Hematocrit: 43.7 % (ref 37.5–51.0)
Hemoglobin: 14.6 g/dL (ref 13.0–17.7)
MCH: 29.1 pg (ref 26.6–33.0)
MCHC: 33.4 g/dL (ref 31.5–35.7)
MCV: 87 fL (ref 79–97)
Platelets: 78 10*3/uL — CL (ref 150–450)
RBC: 5.01 x10E6/uL (ref 4.14–5.80)
RDW: 13.5 % (ref 11.6–15.4)
WBC: 4.5 10*3/uL (ref 3.4–10.8)

## 2020-08-04 LAB — LIPID PANEL WITH LDL/HDL RATIO
Cholesterol, Total: 120 mg/dL (ref 100–199)
HDL: 32 mg/dL — ABNORMAL LOW (ref >39)
LDL Chol Calc (NIH): 76 mg/dL (ref 0–99)
LDL/HDL Ratio: 2.4 ratio (ref 0.0–3.6)
Triglycerides: 56 mg/dL (ref 0–149)
VLDL Cholesterol Cal: 12 mg/dL (ref 5–40)

## 2020-08-04 LAB — HGB A1C W/O EAG: Hgb A1c MFr Bld: 6.1 % — ABNORMAL HIGH (ref 4.8–5.6)

## 2020-08-04 LAB — T4, FREE: Free T4: 1.05 ng/dL (ref 0.82–1.77)

## 2020-08-04 LAB — TSH: TSH: 3.34 u[IU]/mL (ref 0.450–4.500)

## 2020-08-09 NOTE — Progress Notes (Signed)
Labs reviewed--originally sent to previous provider, unable to review unable 7 days after resulted. Will discuss at upcoming visit. Monitor platelets--consider further testing.

## 2020-08-11 ENCOUNTER — Encounter: Payer: Self-pay | Admitting: Hospice and Palliative Medicine

## 2020-08-11 ENCOUNTER — Ambulatory Visit (INDEPENDENT_AMBULATORY_CARE_PROVIDER_SITE_OTHER): Payer: BC Managed Care – PPO | Admitting: Hospice and Palliative Medicine

## 2020-08-11 ENCOUNTER — Other Ambulatory Visit: Payer: Self-pay

## 2020-08-11 VITALS — BP 124/82 | HR 95 | Temp 97.3°F | Resp 16 | Ht 67.0 in | Wt 159.2 lb

## 2020-08-11 DIAGNOSIS — Z0001 Encounter for general adult medical examination with abnormal findings: Secondary | ICD-10-CM

## 2020-08-11 DIAGNOSIS — F1010 Alcohol abuse, uncomplicated: Secondary | ICD-10-CM

## 2020-08-11 DIAGNOSIS — R300 Dysuria: Secondary | ICD-10-CM | POA: Diagnosis not present

## 2020-08-11 DIAGNOSIS — M5412 Radiculopathy, cervical region: Secondary | ICD-10-CM | POA: Diagnosis not present

## 2020-08-11 DIAGNOSIS — D696 Thrombocytopenia, unspecified: Secondary | ICD-10-CM

## 2020-08-11 DIAGNOSIS — G8929 Other chronic pain: Secondary | ICD-10-CM

## 2020-08-11 MED ORDER — CYCLOBENZAPRINE HCL 5 MG PO TABS: 5.0000 mg | ORAL_TABLET | Freq: Three times a day (TID) | ORAL | 1 refills | Status: DC | PRN
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
## 2020-08-12 ENCOUNTER — Encounter: Payer: Self-pay | Admitting: Hospice and Palliative Medicine

## 2020-08-12 LAB — UA/M W/RFLX CULTURE, ROUTINE
Bilirubin, UA: NEGATIVE
Ketones, UA: NEGATIVE
Leukocytes,UA: NEGATIVE
Nitrite, UA: NEGATIVE
Protein,UA: NEGATIVE
RBC, UA: NEGATIVE
Specific Gravity, UA: 1.025 (ref 1.005–1.030)
Urobilinogen, Ur: 0.2 mg/dL (ref 0.2–1.0)
pH, UA: 5 (ref 5.0–7.5)

## 2020-08-12 LAB — MICROSCOPIC EXAMINATION
Bacteria, UA: NONE SEEN
Casts: NONE SEEN /lpf
Epithelial Cells (non renal): NONE SEEN /hpf (ref 0–10)
RBC, Urine: NONE SEEN /hpf (ref 0–2)
WBC, UA: NONE SEEN /hpf (ref 0–5)

## 2020-08-25 ENCOUNTER — Other Ambulatory Visit: Payer: Self-pay | Admitting: Internal Medicine

## 2020-08-25 DIAGNOSIS — E7800 Pure hypercholesterolemia, unspecified: Secondary | ICD-10-CM

## 2020-08-31 ENCOUNTER — Telehealth: Payer: Self-pay

## 2020-08-31 NOTE — Telephone Encounter (Signed)
Faxed referral to [facility] at [phone]. [place] Sousa

## 2020-09-02 ENCOUNTER — Inpatient Hospital Stay: Payer: BC Managed Care – PPO | Attending: Oncology

## 2020-09-02 DIAGNOSIS — D693 Immune thrombocytopenic purpura: Secondary | ICD-10-CM | POA: Diagnosis not present

## 2020-09-02 DIAGNOSIS — C61 Malignant neoplasm of prostate: Secondary | ICD-10-CM | POA: Insufficient documentation

## 2020-09-02 DIAGNOSIS — Z803 Family history of malignant neoplasm of breast: Secondary | ICD-10-CM | POA: Insufficient documentation

## 2020-09-02 DIAGNOSIS — Z833 Family history of diabetes mellitus: Secondary | ICD-10-CM | POA: Insufficient documentation

## 2020-09-02 DIAGNOSIS — Z9079 Acquired absence of other genital organ(s): Secondary | ICD-10-CM | POA: Diagnosis not present

## 2020-09-02 DIAGNOSIS — E7800 Pure hypercholesterolemia, unspecified: Secondary | ICD-10-CM | POA: Insufficient documentation

## 2020-09-02 DIAGNOSIS — Z87891 Personal history of nicotine dependence: Secondary | ICD-10-CM | POA: Insufficient documentation

## 2020-09-02 DIAGNOSIS — E538 Deficiency of other specified B group vitamins: Secondary | ICD-10-CM

## 2020-09-02 DIAGNOSIS — R1319 Other dysphagia: Secondary | ICD-10-CM | POA: Diagnosis not present

## 2020-09-02 DIAGNOSIS — Z79899 Other long term (current) drug therapy: Secondary | ICD-10-CM | POA: Insufficient documentation

## 2020-09-02 DIAGNOSIS — D696 Thrombocytopenia, unspecified: Secondary | ICD-10-CM

## 2020-09-02 DIAGNOSIS — Z8261 Family history of arthritis: Secondary | ICD-10-CM | POA: Diagnosis not present

## 2020-09-02 LAB — CBC WITH DIFFERENTIAL/PLATELET
Abs Immature Granulocytes: 0.03 10*3/uL (ref 0.00–0.07)
Basophils Absolute: 0 10*3/uL (ref 0.0–0.1)
Basophils Relative: 1 %
Eosinophils Absolute: 0.2 10*3/uL (ref 0.0–0.5)
Eosinophils Relative: 4 %
HCT: 42.3 % (ref 39.0–52.0)
Hemoglobin: 14.5 g/dL (ref 13.0–17.0)
Immature Granulocytes: 1 %
Lymphocytes Relative: 33 %
Lymphs Abs: 1.7 10*3/uL (ref 0.7–4.0)
MCH: 29.9 pg (ref 26.0–34.0)
MCHC: 34.3 g/dL (ref 30.0–36.0)
MCV: 87.2 fL (ref 80.0–100.0)
Monocytes Absolute: 0.4 10*3/uL (ref 0.1–1.0)
Monocytes Relative: 7 %
Neutro Abs: 2.9 10*3/uL (ref 1.7–7.7)
Neutrophils Relative %: 54 %
Platelets: 97 10*3/uL — ABNORMAL LOW (ref 150–400)
RBC: 4.85 MIL/uL (ref 4.22–5.81)
RDW: 13.4 % (ref 11.5–15.5)
Smear Review: DECREASED
WBC: 5.2 10*3/uL (ref 4.0–10.5)
nRBC: 0 % (ref 0.0–0.2)

## 2020-09-02 LAB — COMPREHENSIVE METABOLIC PANEL
ALT: 19 U/L (ref 0–44)
AST: 20 U/L (ref 15–41)
Albumin: 4.5 g/dL (ref 3.5–5.0)
Alkaline Phosphatase: 93 U/L (ref 38–126)
Anion gap: 7 (ref 5–15)
BUN: 15 mg/dL (ref 6–20)
CO2: 25 mmol/L (ref 22–32)
Calcium: 9 mg/dL (ref 8.9–10.3)
Chloride: 106 mmol/L (ref 98–111)
Creatinine, Ser: 0.88 mg/dL (ref 0.61–1.24)
GFR, Estimated: >60 mL/min (ref >60)
Glucose, Bld: 95 mg/dL (ref 70–99)
Potassium: 3.7 mmol/L (ref 3.5–5.1)
Sodium: 138 mmol/L (ref 135–145)
Total Bilirubin: 0.3 mg/dL (ref 0.3–1.2)
Total Protein: 6.9 g/dL (ref 6.5–8.1)

## 2020-09-02 LAB — VITAMIN B12: Vitamin B-12: 364 pg/mL (ref 180–914)

## 2020-09-05 ENCOUNTER — Inpatient Hospital Stay (HOSPITAL_BASED_OUTPATIENT_CLINIC_OR_DEPARTMENT_OTHER): Payer: BC Managed Care – PPO | Admitting: Oncology

## 2020-09-05 ENCOUNTER — Inpatient Hospital Stay: Payer: BC Managed Care – PPO

## 2020-09-05 ENCOUNTER — Encounter: Payer: Self-pay | Admitting: Oncology

## 2020-09-05 VITALS — BP 123/82 | HR 74 | Temp 97.2°F | Resp 18 | Wt 159.4 lb

## 2020-09-05 DIAGNOSIS — Z803 Family history of malignant neoplasm of breast: Secondary | ICD-10-CM | POA: Diagnosis not present

## 2020-09-05 DIAGNOSIS — D696 Thrombocytopenia, unspecified: Secondary | ICD-10-CM

## 2020-09-05 DIAGNOSIS — Z8261 Family history of arthritis: Secondary | ICD-10-CM | POA: Diagnosis not present

## 2020-09-05 DIAGNOSIS — E538 Deficiency of other specified B group vitamins: Secondary | ICD-10-CM

## 2020-09-05 DIAGNOSIS — C61 Malignant neoplasm of prostate: Secondary | ICD-10-CM | POA: Diagnosis not present

## 2020-09-05 DIAGNOSIS — Z87891 Personal history of nicotine dependence: Secondary | ICD-10-CM | POA: Diagnosis not present

## 2020-09-05 DIAGNOSIS — R1319 Other dysphagia: Secondary | ICD-10-CM | POA: Diagnosis not present

## 2020-09-05 DIAGNOSIS — E7800 Pure hypercholesterolemia, unspecified: Secondary | ICD-10-CM | POA: Diagnosis not present

## 2020-09-05 DIAGNOSIS — Z833 Family history of diabetes mellitus: Secondary | ICD-10-CM | POA: Diagnosis not present

## 2020-09-05 DIAGNOSIS — Z79899 Other long term (current) drug therapy: Secondary | ICD-10-CM | POA: Diagnosis not present

## 2020-09-05 DIAGNOSIS — Z9079 Acquired absence of other genital organ(s): Secondary | ICD-10-CM | POA: Diagnosis not present

## 2020-09-05 DIAGNOSIS — D693 Immune thrombocytopenic purpura: Secondary | ICD-10-CM | POA: Diagnosis not present

## 2020-09-05 NOTE — Progress Notes (Signed)
Pt reports he has been having trouble swallowing his medication, usually he can take all pills at one time. Here lately, he has to take one pill at a time and still has trouble. Also, reports he can no longer eat steaks because he chokes as well.

## 2020-09-05 NOTE — Progress Notes (Signed)
Hematology/Oncology  Follow up note [facility] [phone] [phone]   Patient Care Team: Lavera Guise, MD as PCP - General (Internal Medicine) Christie Nottingham, PA as Referring Physician (Physician Assistant) Christene Lye, MD (General Surgery) Earlie Server, MD as Consulting Physician (Oncology)  PURPOSE OF CONSULTATION:  Follow up of management of thrombocytopenia.  HISTORY OF PRESENTING ILLNESS:  Donald Mendoza is a  60 y.o.  male with PMH listed below who was referred to me for evaluation of thrombocytopenia. Patient was recently diagnosed with intermediate risk prostate cancer in the follows up with Dr. Erlene Quan. Initially he was diagnosed with a low-risk prostate cancer Gleason score 3+3 in March 2018 when his PSA was 4. PSA continued to rise to 5.2 and patient had a repeat prostate biopsy revealing Gleason score 4+3. Patient was scheduled for radical prostatectomy however was found to have thrombocytopenia. Referred to [place] to evaluation for the low platelet count. Patient reports that he has been told that his platelet has been low in the past. He never received any treatment.  # S/p RALP with BPLND 05/27/2017, pathology showed Gleason 4+3, pT2 N0, NEGATIVE FOR EXTRAPROSTATIC EXTENSION, SEMINAL VESICLE INVASION, AND BLADDER NECK INVASION. - MARGINS NEGATIVE FOR CARCINOMA.  # Prostate cancer pT2N0, PSA <0.1, no adverse features, , no adjuvant treatment was offered.  # 11/15/2017 bone marrow biopsy showed normocellular bone marrow for age with trilineage hematopoiesis.  Bone marrow showed megakaryocytes with predominantly normal morphology.  Suggesting peripheral destruction, sequestration or consumption of platelets. Bone marrow cytogenetics were normal. Discussed with patient about his bone marrow biopsy results.  His chronic thrombocytopenia most likely secondary to ITP, possibly due to underlying autoimmune process.    INTERVAL  HISTORY 60 y.o. male with history of prostate cancer s/p prostectomy, ITP presents for follow-up. Denies hematochezia, hematuria, hematemesis, epistaxis, black tarry stool or easy bruising.  Last PSA was check in September 2021 PSA <0.1 Patient reports symptoms medication or solid food recently.  Patient has a follow-up appointment with primary care provider for evaluation of the swallowing difficulties.  Denies any unintentional weight loss, bleeding, nausea vomiting diarrhea, fever or chills. Patient admits to drink hard liquor a few times per week.  Review of Systems  Constitutional: Negative for chills, fever, malaise/fatigue and weight loss.  HENT: Negative for sore throat.   Eyes: Negative for redness.  Respiratory: Negative for cough, shortness of breath and wheezing.   Cardiovascular: Negative for chest pain, palpitations and leg swelling.  Gastrointestinal: Negative for abdominal pain, blood in stool, nausea and vomiting.       Dysphagia  Genitourinary: Negative for dysuria.  Musculoskeletal: Negative for myalgias.  Skin: Negative for rash.  Neurological: Negative for dizziness, tingling and tremors.  Endo/Heme/Allergies: Does not bruise/bleed easily.  Psychiatric/Behavioral: Negative for hallucinations.    MEDICAL HISTORY:  Past Medical History:  Diagnosis Date  . B12 deficiency 04/21/2017  . Cancer [facility]) 2018   Prostate Cancer  . Diverticulosis   . GERD (gastroesophageal reflux disease)   . Glaucoma   . Hypercholesteremia     SURGICAL HISTORY: Past Surgical History:  Procedure Laterality Date  . COLONOSCOPY    . HERNIA REPAIR     10 years ago, [employer]. umbilical hernia x 2  . PELVIC LYMPH NODE DISSECTION Bilateral 05/27/2017   Procedure: PELVIC LYMPH NODE DISSECTION;  Surgeon: Hollice Espy, MD;  Location: [facility] ORS;  Service: Urology;  Laterality: Bilateral;  . ROBOT ASSISTED LAPAROSCOPIC RADICAL PROSTATECTOMY N/A 05/27/2017  Procedure: ROBOTIC ASSISTED LAPAROSCOPIC  RADICAL PROSTATECTOMY;  Surgeon: Hollice Espy, MD;  Location: [facility] ORS;  Service: Urology;  Laterality: N/A;    SOCIAL HISTORY: Social History   Socioeconomic History  . Marital status: Single    Spouse name: Not on file  . Number of children: Not on file  . Years of education: Not on file  . Highest education level: Not on file  Occupational History  . Not on file  Tobacco Use  . Smoking status: Former Smoker    Packs/day: 0.50    Years: 34.00    Pack years: 17.00    Types: Cigarettes    Quit date: 06/15/2018    Years since quitting: 2.2  . Smokeless tobacco: Never Used  Vaping Use  . Vaping Use: Never used  Substance and Sexual Activity  . Alcohol use: Yes    Comment: socialy   . Drug use: No  . Sexual activity: Yes  Other Topics Concern  . Not on file  Social History Narrative  . Not on file   Social Determinants of Health   Financial Resource Strain: Not on file  Food Insecurity: Not on file  Transportation Needs: Not on file  Physical Activity: Not on file  Stress: Not on file  Social Connections: Not on file  Intimate Partner Violence: Not on file    FAMILY HISTORY: Family History  Problem Relation Age of Onset  . Diabetes Mother   . Breast cancer Mother   . Arthritis Father   . Breast cancer Sister     ALLERGIES:  is allergic to sulfa antibiotics.  MEDICATIONS:  Current Outpatient Medications  Medication Sig Dispense Refill  . latanoprost (XALATAN) 0.005 % ophthalmic solution Place 1 drop into both eyes at bedtime.    Marland Kitchen omeprazole (PRILOSEC) 20 MG capsule Take 1 capsule (20 mg total) by mouth daily. 30 capsule 3  . pravastatin (PRAVACHOL) 40 MG tablet TAKE 1 TABLET(40 MG) BY MOUTH DAILY 30 tablet 2  . vitamin B-12 (CYANOCOBALAMIN) 1000 MCG tablet Take 1 tablet (1,000 mcg total) by mouth daily. 90 tablet 1  . cyclobenzaprine (FLEXERIL) 5 MG tablet Take 1 tablet (5 mg total) by mouth 3 (three) times daily as needed for muscle spasms. (Patient not  taking: Reported on 09/05/2020) 60 tablet 1  . sildenafil (REVATIO) 20 MG tablet Take 1 tablet (20 mg total) by mouth as needed. (Patient not taking: Reported on 09/05/2020) 90 tablet 4   No current facility-administered medications for this visit.     PHYSICAL EXAMINATION: ECOG PERFORMANCE STATUS: 0 - Asymptomatic Vitals:   09/05/20 1316  BP: 123/82  Pulse: 74  Resp: 18  Temp: (!) 97.2 F (36.2 C)  SpO2: 100%   Filed Weights   09/05/20 1309  Weight: 159 lb 6.4 oz (72.3 kg)    Physical Exam Constitutional:      General: He is not in acute distress.    Appearance: He is not diaphoretic.  HENT:     Head: Normocephalic and atraumatic.     Mouth/Throat:     Pharynx: No oropharyngeal exudate.  Eyes:     General: No scleral icterus.    Pupils: Pupils are equal, round, and reactive to light.  Cardiovascular:     Rate and Rhythm: Normal rate and regular rhythm.     Heart sounds: No murmur heard.   Pulmonary:     Effort: Pulmonary effort is normal. No respiratory distress.     Breath sounds: No rales.  Chest:  Chest wall: No tenderness.  Abdominal:     General: There is no distension.     Palpations: Abdomen is soft.     Tenderness: There is no abdominal tenderness.  Musculoskeletal:        General: Normal range of motion.     Cervical back: Normal range of motion and neck supple.  Skin:    General: Skin is warm and dry.     Findings: No erythema.  Neurological:     Mental Status: He is alert and oriented to person, place, and time.     Cranial Nerves: No cranial nerve deficit.     Motor: No abnormal muscle tone.     Coordination: Coordination normal.  Psychiatric:        Mood and Affect: Affect normal.      LABORATORY DATA:  I have reviewed the data as listed Lab Results  Component Value Date   WBC 5.2 09/02/2020   HGB 14.5 09/02/2020   HCT 42.3 09/02/2020   MCV 87.2 09/02/2020   PLT 97 (L) 09/02/2020   Recent Labs    02/26/20 1336 07/08/20 1344  08/02/20 0841 09/02/20 1455  NA 139 137 142 138  K 4.0 3.9 4.1 3.7  CL 105 101 103 106  CO2 29 29 24 25   GLUCOSE 113* 107* 104* 95  BUN 11 10 9 15   CREATININE 0.95 0.83 1.01 0.88  CALCIUM 8.7* 9.0 9.4 9.0  GFRNONAA >60 >60  --  >60  PROT 6.8 7.0 6.6 6.9  ALBUMIN 4.0 3.9 4.4 4.5  AST 19 18 17 20   ALT 19 21 17 19   ALKPHOS 90 85 109 93  BILITOT 0.8 0.4 0.3 0.3    RADIOGRAPHIC STUDIES: I have personally reviewed the radiological images as listed and agreed with the findings in the report. US Abdomen showed no liver parachyema changes or splenomegaly.    ASSESSMENT & PLAN:  1. Thrombocytopenia ([place])   2. B12 deficiency   3. Prostate cancer ([place])   4. Other dysphagia     # Chronic thrombocytopenia:  ITP,  Labs reviewed and discuss patient Overall platelet count is stable at 97,000.  Close to his baseline.  # Prostate Cancer pT2(m) pN0 PSA 5.2 gleason score 4+3, grade group 3--Stage IIC, unfavorable intermediate risk group. status post prostatectomy BPLND .   We will check his PSA.  He follows up with urology Dr. Erlene Quan annually.  # History of vitamin B12 deficiency, B12 level is 364, I recommend patient to continue oral B12 1098mg Discussed about alcohol cessation. #Dysphagia, he has an appointment with primary care provider to discuss this problem.  Most likely he will need a gastroenterology referral for work-up.  Alcohol cessation.  Return of visit: 6 months.  We spent sufficient time to discuss many aspect of care, questions were answered to patient's satisfaction.  ZEarlie Server MD, PhD 09/05/2020

## 2020-09-07 ENCOUNTER — Ambulatory Visit: Payer: BC Managed Care – PPO | Admitting: Hospice and Palliative Medicine

## 2020-09-07 LAB — PROSTATE-SPECIFIC AG, SERUM (LABCORP): Prostate Specific Ag, Serum: <0.1 ng/mL

## 2020-09-09 DIAGNOSIS — S83001A Unspecified subluxation of right patella, initial encounter: Secondary | ICD-10-CM | POA: Diagnosis not present

## 2020-09-09 DIAGNOSIS — M13861 Other specified arthritis, right knee: Secondary | ICD-10-CM | POA: Diagnosis not present

## 2020-09-09 DIAGNOSIS — M2241 Chondromalacia patellae, right knee: Secondary | ICD-10-CM | POA: Diagnosis not present

## 2020-09-14 ENCOUNTER — Encounter: Payer: Self-pay | Admitting: Hospice and Palliative Medicine

## 2020-09-14 ENCOUNTER — Other Ambulatory Visit: Payer: Self-pay

## 2020-09-14 ENCOUNTER — Ambulatory Visit (INDEPENDENT_AMBULATORY_CARE_PROVIDER_SITE_OTHER): Payer: BC Managed Care – PPO | Admitting: Hospice and Palliative Medicine

## 2020-09-14 VITALS — BP 130/82 | HR 81 | Temp 98.7°F | Resp 16 | Ht 67.0 in | Wt 160.4 lb

## 2020-09-14 DIAGNOSIS — D696 Thrombocytopenia, unspecified: Secondary | ICD-10-CM | POA: Diagnosis not present

## 2020-09-14 DIAGNOSIS — R1310 Dysphagia, unspecified: Secondary | ICD-10-CM | POA: Diagnosis not present

## 2020-09-14 NOTE — Progress Notes (Signed)
[facility] [place], [place] [address]  Internal MEDICINE  Office Visit Note  Patient Name: Donald Mendoza  [id number]  [id number]  Date of Service: 09/21/2020  Chief Complaint  Patient presents with  . Acute Visit    Patient having issue swallowing pills, pt has tried to swallow food and get stuck going down      HPI Pt is here for a sick visit. For the past 3 weeks he has been having difficulty with swallowing Started with just being his medications but now has noticed with certain foods and even with liquids he is having difficulty Results in him choking and ultimately vomiting whatever it was that he was attempting to swallow Does not happen with every meal and has not been able to pinpoint exact foods that are causing symptoms Recently started taking his medications one pill at a time and this has prevented episodes from happening with his medicines  Currently taking PPI for GERD  Current Medication:  Outpatient Encounter Medications as of 09/14/2020  Medication Sig  . cyclobenzaprine (FLEXERIL) 5 MG tablet Take 1 tablet (5 mg total) by mouth 3 (three) times daily as needed for muscle spasms.  Marland Kitchen latanoprost (XALATAN) 0.005 % ophthalmic solution Place 1 drop into both eyes at bedtime.  Marland Kitchen omeprazole (PRILOSEC) 20 MG capsule Take 1 capsule (20 mg total) by mouth daily.  . pravastatin (PRAVACHOL) 40 MG tablet TAKE 1 TABLET(40 MG) BY MOUTH DAILY  . sildenafil (REVATIO) 20 MG tablet Take 1 tablet (20 mg total) by mouth as needed.  . vitamin B-12 (CYANOCOBALAMIN) 1000 MCG tablet Take 1 tablet (1,000 mcg total) by mouth daily.   No facility-administered encounter medications on file as of 09/14/2020.      Medical History: Past Medical History:  Diagnosis Date  . B12 deficiency 04/21/2017  . Cancer [facility]) 2018   Prostate Cancer  . Diverticulosis   . GERD (gastroesophageal reflux disease)   . Glaucoma   . Hypercholesteremia      Vital  Signs: BP 130/82   Pulse 81   Temp 98.7 F (37.1 C)   Resp 16   Ht 5\' 7"  (1.702 m)   Wt 160 lb 6.4 oz (72.8 kg)   SpO2 97%   BMI 25.12 kg/m    Review of Systems  Constitutional: Negative for chills, fatigue and unexpected weight change.  HENT: Negative for congestion, postnasal drip, rhinorrhea, sneezing and sore throat.   Eyes: Negative for redness.  Respiratory: Negative for cough, chest tightness and shortness of breath.   Cardiovascular: Negative for chest pain and palpitations.  Gastrointestinal: Negative for abdominal pain, constipation, diarrhea, nausea and vomiting.       Difficulty swallowing, choking  Genitourinary: Negative for dysuria and frequency.  Musculoskeletal: Negative for arthralgias, back pain, joint swelling and neck pain.  Skin: Negative for rash.  Neurological: Negative for tremors and numbness.  Hematological: Negative for adenopathy. Does not bruise/bleed easily.  Psychiatric/Behavioral: Negative for behavioral problems (Depression), sleep disturbance and suicidal ideas. The patient is not nervous/anxious.     Physical Exam Vitals reviewed.  Constitutional:      Appearance: Normal appearance. He is normal weight.  Cardiovascular:     Rate and Rhythm: Normal rate and regular rhythm.     Pulses: Normal pulses.     Heart sounds: Normal heart sounds.  Pulmonary:     Effort: Pulmonary effort is normal.     Breath sounds: Normal breath sounds.  Abdominal:  General: Abdomen is flat.     Palpations: Abdomen is soft.  Musculoskeletal:        General: Normal range of motion.     Cervical back: Normal range of motion.  Skin:    General: Skin is warm.  Neurological:     General: No focal deficit present.     Mental Status: He is alert and oriented to person, place, and time. Mental status is at baseline.  Psychiatric:        Mood and Affect: Mood normal.        Behavior: Behavior normal.        Thought Content: Thought content normal.         Judgment: Judgment normal.    Assessment/Plan: 1. Dysphagia, unspecified type Will review results of UGI and adjust plan of care as indicated Continue with PPI therapy Discussed safe swallowing practices such as avoiding conversing while eating, chewing foods more, having liquids every 2-3 bites and avoid lying flat for up to 1 hour after eating - DG UGI W DOUBLE CM (HD BA); Future  2. Thrombocytopenia ([place]) Followed and managed by hematology  General Counseling: Job verbalizes understanding of the findings of todays visit and agrees with plan of treatment. I have discussed any further diagnostic evaluation that may be needed or ordered today. We also reviewed his medications today. he has been encouraged to call the office with any questions or concerns that should arise related to todays visit.   Orders Placed This Encounter  Procedures  . DG UGI W DOUBLE CM (HD BA)   Time spent:25 Minutes  This patient was seen by Theodoro Grist AGNP-C in Collaboration with Dr Lavera Guise as a part of collaborative care agreement.  Tanna Furry [facility] Internal Medicine

## 2020-09-21 ENCOUNTER — Encounter: Payer: Self-pay | Admitting: Hospice and Palliative Medicine

## 2020-09-28 ENCOUNTER — Other Ambulatory Visit: Payer: Self-pay | Admitting: Hospice and Palliative Medicine

## 2020-09-28 ENCOUNTER — Ambulatory Visit
Admission: RE | Admit: 2020-09-28 | Discharge: 2020-09-28 | Disposition: A | Discharge status: Home / Self Care | Payer: BC Managed Care – PPO | Source: Ambulatory Visit | Attending: Hospice and Palliative Medicine | Admitting: Hospice and Palliative Medicine

## 2020-09-28 ENCOUNTER — Other Ambulatory Visit: Payer: Self-pay

## 2020-09-28 DIAGNOSIS — R1310 Dysphagia, unspecified: Secondary | ICD-10-CM

## 2020-09-28 NOTE — Progress Notes (Signed)
Abnormal UGI/barium swallow. Needs to see GI for direct visualization

## 2020-10-12 ENCOUNTER — Ambulatory Visit (INDEPENDENT_AMBULATORY_CARE_PROVIDER_SITE_OTHER): Payer: BC Managed Care – PPO | Admitting: Nurse Practitioner

## 2020-10-12 ENCOUNTER — Encounter: Payer: Self-pay | Admitting: Nurse Practitioner

## 2020-10-12 ENCOUNTER — Other Ambulatory Visit: Payer: Self-pay

## 2020-10-12 DIAGNOSIS — E7800 Pure hypercholesterolemia, unspecified: Secondary | ICD-10-CM | POA: Diagnosis not present

## 2020-10-12 DIAGNOSIS — Z122 Encounter for screening for malignant neoplasm of respiratory organs: Secondary | ICD-10-CM | POA: Diagnosis not present

## 2020-10-12 DIAGNOSIS — G4733 Obstructive sleep apnea (adult) (pediatric): Secondary | ICD-10-CM | POA: Diagnosis not present

## 2020-10-12 DIAGNOSIS — K219 Gastro-esophageal reflux disease without esophagitis: Secondary | ICD-10-CM

## 2020-10-12 MED ORDER — PRAVASTATIN SODIUM 40 MG PO TABS: ORAL_TABLET | ORAL | 2 refills | Status: DC

## 2020-10-12 NOTE — Progress Notes (Signed)
[facility] [place], [place] [address]  Internal MEDICINE  Office Visit Note  Patient Name: Donald Mendoza  [id number]  [id number]  Date of Service: 10/13/2020  Chief Complaint  Patient presents with  . Follow-up    Flexril has a reaction, stop breathing in sleep for about a week   . Gastroesophageal Reflux    results  . Cancer  . Glaucoma  . Hyperlipidemia    HPI  Donald Mendoza presents for a follow-up to discuss the results of his esophageal imaging related to his gastroesophageal reflux.  In the past week, he has had a problem with falling asleep and not breathing, and waking up gasping for air.  He reports that it is very scary and he had a friend at his house the other night when he fell asleep and the person woke him up telling him he was not breathing.  He reports that this happens more frequently if he is laying flat.  Elevating his head does help some. - Donald Mendoza quit smoking 3 years ago he smoked for more than 20 years he is over 35 years old.  He has a persistent cough that is nonproductive and he is having these nighttime episodes of apnea and gasping for air.  A low-dose CT scan of the chest to screen for lung cancer is ordered to rule out any lower respiratory issue causing this problem. -Donald Mendoza has severe gastroesophageal reflux disease, although he has not been taking his medication consistently.  He stopped taking the medication and was mixing baking soda with water and drinking that to ease his symptoms.  Instructed patient to stop using baking soda and to start taking the omeprazole 20 mg daily.  If this medication does not work, the patient was informed that there are other medications that can be used. -The patient is of normal weight and BMI. Discussed the results of the esophageal imaging.  No stricture was visualized on the imaging study.  An esophageal stricture was assumed to be present in the distal esophagus where the contrast pill became  stuck and the radiology tech had the patient drink water for about 12 minutes in the facility and the pill was still in the same spot.  He was instructed to continue hydrating to help the pill to dissolve or become dislodged. - The patient does have a history of prostate cancer this is when he quit smoking because he was worried he would end up having lung cancer.  Current Medication: Outpatient Encounter Medications as of 10/12/2020  Medication Sig  . latanoprost (XALATAN) 0.005 % ophthalmic solution Place 1 drop into both eyes at bedtime.  Marland Kitchen omeprazole (PRILOSEC) 20 MG capsule Take 1 capsule (20 mg total) by mouth daily.  . vitamin B-12 (CYANOCOBALAMIN) 1000 MCG tablet Take 1 tablet (1,000 mcg total) by mouth daily.  . [DISCONTINUED] pravastatin (PRAVACHOL) 40 MG tablet TAKE 1 TABLET(40 MG) BY MOUTH DAILY  . [DISCONTINUED] sildenafil (REVATIO) 20 MG tablet Take 1 tablet (20 mg total) by mouth as needed.  . pravastatin (PRAVACHOL) 40 MG tablet TAKE 1 TABLET(40 MG) BY MOUTH DAILY  . [DISCONTINUED] cyclobenzaprine (FLEXERIL) 5 MG tablet Take 1 tablet (5 mg total) by mouth 3 (three) times daily as needed for muscle spasms. (Patient not taking: Reported on 10/12/2020)   No facility-administered encounter medications on file as of 10/12/2020.    Surgical History: Past Surgical History:  Procedure Laterality Date  . COLONOSCOPY    . HERNIA REPAIR  10 years ago, [facility]. umbilical hernia x 2  . PELVIC LYMPH NODE DISSECTION Bilateral 05/27/2017   Procedure: PELVIC LYMPH NODE DISSECTION;  Surgeon: Hollice Espy, MD;  Location: [facility] ORS;  Service: Urology;  Laterality: Bilateral;  . ROBOT ASSISTED LAPAROSCOPIC RADICAL PROSTATECTOMY N/A 05/27/2017   Procedure: ROBOTIC ASSISTED LAPAROSCOPIC RADICAL PROSTATECTOMY;  Surgeon: Hollice Espy, MD;  Location: [facility] ORS;  Service: Urology;  Laterality: N/A;    Medical History: Past Medical History:  Diagnosis Date  . B12 deficiency 04/21/2017  . Cancer  [facility]) 2018   Prostate Cancer  . Diverticulosis   . GERD (gastroesophageal reflux disease)   . Glaucoma   . Hypercholesteremia     Family History: Family History  Problem Relation Age of Onset  . Diabetes Mother   . Breast cancer Mother   . Arthritis Father   . Breast cancer Sister     Social History   Socioeconomic History  . Marital status: Single    Spouse name: Not on file  . Number of children: Not on file  . Years of education: Not on file  . Highest education level: Not on file  Occupational History  . Not on file  Tobacco Use  . Smoking status: Former Smoker    Packs/day: 0.50    Years: 34.00    Pack years: 17.00    Types: Cigarettes    Quit date: 06/15/2018    Years since quitting: 2.3  . Smokeless tobacco: Never Used  Vaping Use  . Vaping Use: Never used  Substance and Sexual Activity  . Alcohol use: Yes    Comment: socialy   . Drug use: No  . Sexual activity: Yes  Other Topics Concern  . Not on file  Social History Narrative  . Not on file   Social Determinants of Health   Financial Resource Strain: Not on file  Food Insecurity: Not on file  Transportation Needs: Not on file  Physical Activity: Not on file  Stress: Not on file  Social Connections: Not on file  Intimate Partner Violence: Not on file      Review of Systems  Constitutional: Positive for fatigue. Negative for unexpected weight change.  HENT: Negative.   Eyes: Negative.   Respiratory: Positive for apnea (periods of apnea at night.), cough, choking (at night waking up gasping) and shortness of breath.        Has the periods of apnea and choking/gasping more often at night if she is laying flat.   Cardiovascular: Negative.  Negative for chest pain.  Gastrointestinal: Positive for nausea (related to acid reflux).       Sleeps with head elevated due to acid reflux  Genitourinary: Negative.   Musculoskeletal: Negative for myalgias.  Neurological: Negative.    Psychiatric/Behavioral: Negative.     Vital Signs: BP (!) 142/82   Pulse 77   Temp 98.8 F (37.1 C)   Resp 16   Ht 5\' 7"  (1.702 m)   Wt 158 lb (71.7 kg)   SpO2 97%   BMI 24.75 kg/m    Physical Exam Vitals reviewed.  Constitutional:      General: He is not in acute distress.    Appearance: Normal appearance. He is normal weight. He is not ill-appearing.  HENT:     Head: Normocephalic and atraumatic.  Cardiovascular:     Rate and Rhythm: Normal rate and regular rhythm.     Pulses: Normal pulses.     Heart sounds: Normal heart sounds.  Pulmonary:  Effort: Pulmonary effort is normal. No respiratory distress.     Breath sounds: Normal breath sounds. No wheezing, rhonchi or rales.  Skin:    General: Skin is warm and dry.     Capillary Refill: Capillary refill takes less than 2 seconds.  Neurological:     Mental Status: He is alert and oriented to person, place, and time.  Psychiatric:        Mood and Affect: Mood normal.        Behavior: Behavior normal.     Assessment/Plan: 1. OSA (obstructive sleep apnea) Malaquias has been having episodes of apnea at night that have been observed by friends in his home.  When he is alone he has had episodes where he wakes up gasping and choking for breath.  He notices these episodes more when he is laying flat.  Sleep study ordered, to be done urgently at [facility] to determine degree of sleep apnea and the need for CPAP. - PSG Sleep Study; Future  2. Screening for lung cancer Hussein has a history of prostate cancer.  He has a history of being an every day smoker for more than 20 years and he is over 89 years old.  He quit smoking 3 years ago but has not had a low-dose CT of the chest to screen for lung cancer.  Due to the respiratory changes he is having, it is important to screen for possible causes. - CT CHEST LUNG CA SCREEN LOW DOSE W/O CM; Future  3. Gastroesophageal reflux disease without esophagitis Skye reports that his acid  reflux is severe.  He has not been taking his omeprazole for it.  He has been using baking soda mixed in water.  He has been instructed not to do this.  He agreed he would start taking his omeprazole as prescribed.  The esophageal imaging that was done recently was unable to visualize any strictures but the contrast pill became lodged in the distal esophagus and it appeared to be a possible stricture there.  Referred to gastroenterology to assess the possible stricture. - Ambulatory referral to Gastroenterology  4. Hypercholesteremia Shray has a history of elevated cholesterol levels.  He is taking pravastatin, refills ordered. - pravastatin (PRAVACHOL) 40 MG tablet; TAKE 1 TABLET(40 MG) BY MOUTH DAILY  Dispense: 30 tablet; Refill: 2   General Counseling: Rock verbalizes understanding of the findings of todays visit and agrees with plan of treatment. I have discussed any further diagnostic evaluation that may be needed or ordered today. We also reviewed his medications today. he has been encouraged to call the office with any questions or concerns that should arise related to todays visit.    Orders Placed This Encounter  Procedures  . CT CHEST LUNG CA SCREEN LOW DOSE W/O CM  . Ambulatory referral to Gastroenterology  . PSG Sleep Study    Meds ordered this encounter  Medications  . pravastatin (PRAVACHOL) 40 MG tablet    Sig: TAKE 1 TABLET(40 MG) BY MOUTH DAILY    Dispense:  30 tablet    Refill:  2   Return in about 2 weeks (around 10/26/2020) for F/U, pulmonary/sleep, Zaeden Lastinger PCP need to schedulesleep study..   Total time spent: 30 Minutes Time spent includes review of chart, medications, test results, and follow up plan with the patient.    Controlled Substance Database was reviewed by me.  This patient was seen by Jonetta Osgood, FNP-C in collaboration with Dr. Clayborn Bigness as a part of collaborative care agreement.  Dr Lavera Guise Internal medicine

## 2020-10-13 ENCOUNTER — Encounter: Payer: Self-pay | Admitting: *Deleted

## 2020-10-13 DIAGNOSIS — M25561 Pain in right knee: Secondary | ICD-10-CM | POA: Diagnosis not present

## 2020-10-13 DIAGNOSIS — G4733 Obstructive sleep apnea (adult) (pediatric): Secondary | ICD-10-CM | POA: Insufficient documentation

## 2020-10-19 ENCOUNTER — Other Ambulatory Visit: Payer: Self-pay | Admitting: *Deleted

## 2020-10-19 ENCOUNTER — Telehealth: Payer: Self-pay

## 2020-10-19 MED ORDER — VITAMIN B-12 1000 MCG PO TABS: 1000.0000 ug | ORAL_TABLET | Freq: Every day | ORAL | 1 refills | Status: DC

## 2020-10-20 ENCOUNTER — Telehealth: Payer: Self-pay

## 2020-10-24 ENCOUNTER — Telehealth: Payer: Self-pay | Admitting: *Deleted

## 2020-10-24 ENCOUNTER — Encounter: Payer: Self-pay | Admitting: *Deleted

## 2020-10-24 DIAGNOSIS — Z87891 Personal history of nicotine dependence: Secondary | ICD-10-CM

## 2020-10-24 DIAGNOSIS — Z122 Encounter for screening for malignant neoplasm of respiratory organs: Secondary | ICD-10-CM

## 2020-10-24 NOTE — Telephone Encounter (Signed)
Received referral for initial lung cancer screening scan. Contacted patient and obtained smoking history,(former smoker, quit in 2020, .0.75 ppd x 34 yrs ) as well as answering questions related to screening process. Patient denies signs of lung cancer such as weight loss or hemoptysis. Patient denies comorbidity that would prevent curative treatment if lung cancer were found. Patient is scheduled for shared decision making visit and CT scan on 11/08/20 @ 2:00 pm.

## 2020-10-25 NOTE — Telephone Encounter (Signed)
FG stated: We have this patient scheduled on Thursday October 20, 2020 for PSG study. We could only get an auth for Patient to do a at home sleep study.

## 2020-10-25 NOTE — Telephone Encounter (Signed)
FG stated: We have this patient scheduled on Wednesday, October 26 2020 for a HST. Canceled the appt: Thursday October 20, 2020.

## 2020-10-26 ENCOUNTER — Encounter (INDEPENDENT_AMBULATORY_CARE_PROVIDER_SITE_OTHER): Payer: BC Managed Care – PPO | Admitting: Internal Medicine

## 2020-10-26 DIAGNOSIS — G4719 Other hypersomnia: Secondary | ICD-10-CM

## 2020-10-27 ENCOUNTER — Encounter (INDEPENDENT_AMBULATORY_CARE_PROVIDER_SITE_OTHER): Payer: BC Managed Care – PPO | Admitting: Internal Medicine

## 2020-10-27 DIAGNOSIS — G4733 Obstructive sleep apnea (adult) (pediatric): Secondary | ICD-10-CM | POA: Diagnosis not present

## 2020-11-03 DIAGNOSIS — G4719 Other hypersomnia: Secondary | ICD-10-CM | POA: Insufficient documentation

## 2020-11-03 NOTE — Procedures (Signed)
[place]  Portable Polysomnogram Report Part 1 Phone: [phone] Fax: [phone]  Patient Name: Donald Mendoza, Donald Mendoza Recording Device: Glee Arvin  D.O.B.: July 25, 1960 Acquisition Number: [id number]  Referring Physician: Jonetta Osgood, FNP-C Acquisition Date: 10/27/2020   History: The patient is a 60 years old male who was referred for re-evaluation of possible sleep apnea.  Medical History: GERD, cancer, glaucoma, hyperlipidemia.  Medications: Prilosec, vitamin B12, pravastatin.  PROCEDURE  The unattended portable polysomnogram was conducted on the night of 10/27/2020.  The following parameters were monitored: Nasal and oral airflow, and body position. Additionally, thoracic and abdominal movements were recorded by inductance plethysmography. Oxygen saturation (SpO2) and heart rate (ECG) was monitored using a pulse Oximeter.  The tracing was scored using 30 second epochs. Hypopneas were scored per AASM definition VIIID1.B (4% desaturation).   Description: The total recording time was 374.4 minutes. Sleep parameters are not recorded.  Respiratory monitoring demonstrated significant snoring across the night in all positions. There were a total of 14 apneas and hypopneas for a Respiratory Event Index of 2.2 apneas and hypopneas per hour of recording. The average duration of the respiratory events was 18.4 seconds with a maximum duration of 40.5 seconds. The respiratory events were associated with peripheral oxygen desaturations on the average to 95 %. The lowest oxygen desaturation associated with a respiratory event was 84 %. Additionally, the mean oxygen saturation was 95 %. The total duration of oxygen < 90% was 2.6 minutes and <80% was 0.0 minutes.   Cardiac monitoring- The average heart rate during the recording was 76.4 bpm.  Impression: This routine overnight portable polysomnogram demonstrated the presence of obstructive sleep apnea. Overall the  Respiratory Event Index was 2.2 apneas and hypopneas per hour of recording.   Recommendations:     Negative home studies do not necessarily rule out significant sleep apnea or other sleep disorders. If there is clinical suspicion for sleep apnea or another sleep disorder a fully attended, in laboratory study is recommended.  Allyne Gee, MD [facility] Diplomate ABMS Pulmonary Critical Care and Sleep Medicine Electronically reviewed and digitally signed   [place]  Portable Polysomnogram Report Part 2 Phone: [phone] Fax: [phone]    Study Date: 10/27/2020  Patient Name: Donald Mendoza, Weber Recording Device: Glee Arvin  Sex: M Height: 67.0 in.  D.O.B.: 01-Sep-1960 Weight: 158.0 lbs.  Age: 78 years B.M.I: 24.7 lb/in2   Times and Durations  Lights off clock time:  11:52:47 PM Total Recording Time (TRT): 374.4 minutes  Lights on clock time: 6:07:11 AM Time In Bed (TIB): 374.4 minutes   Summary  AHI 2.2 OAI 1.9 CAI 0.0 Lowest Desat 84  AHI is the number of apneas and hypopneas per hour. OAI is the number of obstructive apneas per hour. CAI is the number of central apneas per hour. Lowest Desat is the lowest blood oxygen level that lasted at least 2 seconds.  RESPIRATORY EVENTS   Index (#/hour) Total # of Events Mean duration  (sec) Max duration  (sec) # of Events by Position       Supine Prone Left Right Up  Central Apneas 0.0 0 0.0 0.0    0 0 0 0  Obstructive Apneas 1.9 12 16.2 23.0    1 0 11 0  Mixed Apneas 0.0 0 0.0 0.0    0 0 0 0  Hypopneas 0.3 2 32.0 40.5    1 0 1 0  Apneas + Hypopneas 2.2 14 18.4  40.5    2 0 12 0  Total 2.2 14 18.4 40.5    2 0 12 0  Time in Position    179.8 70.0 124.1 0.5  AHI in Position    0.7 0.0 5.8 0.0    Oximetry Summary   Dur. (min) % TIB  <90 % 2.6 0.7  <85 % 0.2 0.1  <80 % 0.0 0.0  <70 % 0.0 0.0  Total Dur (min) < 89 2.2 min  Average (%) 95  Total # of Desats 41  Desat Index (#/hour) 6.8  Desat Max (%) 6  Desat Max dur  (sec) 67.0  Lowest SpO2 % during sleep 84  Duration of Min SpO2 (sec) 17    Heart Rate Stats  Mean HR during sleep (BPM)  Highest HR during sleep 114  (BPM)  Highest HR during TIB  114 (BPM)    Snoring Summary  Total Snoring Episodes 71  Total Duration with Snoring 34.3 minutes  Mean Duration of Snoring 29.0 seconds  Percentage of Snoring 9.2 %

## 2020-11-03 NOTE — Procedures (Signed)
[place]  Portable Polysomnogram Report Part 1 Phone: [phone] Fax: [phone]  Patient Name: Donald Mendoza, Donald Mendoza Recording Device: Glee Arvin  D.O.B.: 08/19/1960 Acquisition Number: [id number]  Referring Physician: Jonetta Osgood, FNP-C Acquisition Date: 10/26/2020   History: The patient is a 60 years old male who was referred for evaluation of possible sleep apnea.  Medical History: GERD, cancer, glaucoma, hyperlipidemia.  Medications: Prilosec, vitamin B12, pravastatin.  PROCEDURE  The unattended portable polysomnogram was conducted on the night of 10/26/2020.  The following parameters were monitored: Nasal and oral airflow, and body position. Additionally, thoracic and abdominal movements were recorded by inductance plethysmography. Oxygen saturation (SpO2) and heart rate (ECG) was monitored using a pulse Oximeter.  The tracing was scored using 30 second epochs. Hypopneas were scored per AASM definition VIIID1.B (4% desaturation).    Description: The total recording time was 356.9 minutes. Sleep parameters are not recorded.  Respiratory monitoring demonstrated significant snoring across the night in all positions. Only 6 respiratory events were noted the entire recording. The mean oxygen saturation was 95 %. The total duration of oxygen < 90% was 0.2 minutes and <80% was 0.0 minutes.   Cardiac monitoring- The average heart rate during the recording was 74.3 bpm.  Impression: This routine overnight portable polysomnogram did not demonstrate obstructive sleep apnea with only 6 respiratory events noted.   Recommendations:     A negative home study does not necessarily rule out significant sleep apnea and a repeat study is recommended.  Allyne Gee MD [facility] Diplomate ABMS Pulmonary Critical care and Sleep Medicine Electronically reviewed and digitally signed   [place]  Portable Polysomnogram Report Part 2 Phone: [phone] Fax: [phone]    Study Date: 10/26/2020  Patient Name: [name], [name] Recording Device: Glee Arvin  Sex: M Height: 67.0 in.  D.O.B.: 1960/12/06 Weight: 158.0 lbs.  Age: 60 years B.M.I: 24.7 lb/in2   Times and Durations  Lights off clock time:  11:55:59 PM Total Recording Time (TRT): 356.9 minutes  Lights on clock time: 5:52:53 AM Time In Bed (TIB): 356.9 minutes   Summary  AHI 1.0 OAI 0.7 CAI 0.0 Lowest Desat 88  AHI is the number of apneas and hypopneas per hour. OAI is the number of obstructive apneas per hour. CAI is the number of central apneas per hour. Lowest Desat is the lowest blood oxygen level that lasted at least 2 seconds.  RESPIRATORY EVENTS   Index (#/hour) Total # of Events Mean duration  (sec) Max duration  (sec) # of Events by Position       Supine Prone Left Right Up  Central Apneas 0.0 0 0.0 0.0    0 0 0 0  Obstructive Apneas 0.7 4 16.0 24.0    1 3 0 0  Mixed Apneas 0.0 0 0.0 0.0    0 0 0 0  Hypopneas 0.3 2 14.8 16.0    2 0 0 0  Apneas + Hypopneas 1.0 6 15.6 24.0    3 3 0 0  Total 1.0 6 15.6 24.0    3 3 0 0  Time in Position    222.0 99.8 23.8 4.1  AHI in Position    0.8 1.8 0.0 0.0    Oximetry Summary   Dur. (min) % TIB  <90 % 0.2 0.1  <85 % 0.0 0.0  <80 % 0.0 0.0  <70 % 0.0 0.0  Total Dur (min) < 89 0.0 min  Average (%)  95  Total # of Desats 21  Desat Index (#/hour) 3.8  Desat Max (%) 8  Desat Max dur (sec) 46.0  Lowest SpO2 % during sleep 88  Duration of Min SpO2 (sec) 4    Heart Rate Stats  Mean HR during sleep (BPM)  Highest HR during sleep 113  (BPM)  Highest HR during TIB  113 (BPM)    Snoring Summary  Total Snoring Episodes 8  Total Duration with Snoring 1.1 minutes  Mean Duration of Snoring 8.2 seconds  Percentage of Snoring 0.3 %

## 2020-11-08 ENCOUNTER — Ambulatory Visit
Admission: RE | Admit: 2020-11-08 | Discharge: 2020-11-08 | Disposition: A | Discharge status: Home / Self Care | Payer: BC Managed Care – PPO | Source: Ambulatory Visit | Attending: Hospice and Palliative Medicine | Admitting: Hospice and Palliative Medicine

## 2020-11-08 ENCOUNTER — Other Ambulatory Visit: Payer: Self-pay

## 2020-11-08 ENCOUNTER — Inpatient Hospital Stay: Payer: BC Managed Care – PPO | Attending: Hospice and Palliative Medicine | Admitting: Hospice and Palliative Medicine

## 2020-11-08 DIAGNOSIS — Z122 Encounter for screening for malignant neoplasm of respiratory organs: Secondary | ICD-10-CM

## 2020-11-08 DIAGNOSIS — Z87891 Personal history of nicotine dependence: Secondary | ICD-10-CM | POA: Diagnosis not present

## 2020-11-08 NOTE — Progress Notes (Signed)
Virtual Visit via Telephon Note  I connected withNAME@ on 11/08/20 [email or web address] by telephone and verified that I am speaking with the correct person using two identifiers.   I discussed the limitations of evaluation and management by telemedicine and the availability of in person appointments. The patient expressed understanding and agreed to proceed.  Location: Patient: Work Provider: Clinic   In accordance with CMS guidelines, patient has met eligibility criteria including age, absence of signs or symptoms of lung cancer.  Social History   Tobacco Use   Smoking status: Former    Packs/day: 0.75    Years: 34.00    Pack years: 25.50    Types: Cigarettes    Quit date: 06/15/2018    Years since quitting: 2.4   Smokeless tobacco: Never  Vaping Use   Vaping Use: Never used  Substance Use Topics   Alcohol use: Yes    Comment: socialy    Drug use: No      A shared decision-making session was conducted prior to the performance of CT scan. This includes one or more decision aids, includes benefits and harms of screening, follow-up diagnostic testing, over-diagnosis, false positive rate, and total radiation exposure.   Counseling on the importance of adherence to annual lung cancer LDCT screening, impact of co-morbidities, and ability or willingness to undergo diagnosis and treatment is imperative for compliance of the program.   Counseling on the importance of continued smoking cessation for former smokers; the importance of smoking cessation for current smokers, and information about tobacco cessation interventions have been given to patient including [place] and [address] quit [place] programs.   Written order for lung cancer screening with LDCT has been given to the patient and any and all questions have been answered to the best of my abilities.    Yearly follow up will be coordinated by Burgess Estelle, Thoracic Navigator.  Time Total: 5 minutes  Visit consisted of  counseling and education dealing with complex health screening. Greater than 50%  of this time was spent counseling and coordinating care related to the above assessment and plan.  Signed by: Altha Harm, PhD, NP-C

## 2020-11-10 ENCOUNTER — Telehealth: Payer: Self-pay | Admitting: *Deleted

## 2020-11-10 NOTE — Telephone Encounter (Signed)
Please discuss with Dr Clayborn Bigness

## 2020-11-10 NOTE — Telephone Encounter (Signed)
Notified patient of LDCT lung cancer screening program results with recommendation for 12 month follow up imaging. Also notified of incidental findings noted below and is encouraged to discuss further with PCP who will receive a copy of this note and/or the CT report. Patient verbalizes understanding.    IMPRESSION: 1. Lung-RADS 2, benign appearance or behavior. Continue annual screening with low-dose chest CT without contrast in 12 months. 2. Emphysema (ICD10-J43.9) and Aortic Atherosclerosis (ICD10-170.0)

## 2020-11-15 ENCOUNTER — Encounter: Payer: Self-pay | Admitting: Nurse Practitioner

## 2020-11-15 ENCOUNTER — Ambulatory Visit (INDEPENDENT_AMBULATORY_CARE_PROVIDER_SITE_OTHER): Payer: BC Managed Care – PPO | Admitting: Nurse Practitioner

## 2020-11-15 ENCOUNTER — Other Ambulatory Visit: Payer: Self-pay

## 2020-11-15 VITALS — BP 116/78 | HR 90 | Temp 97.3°F | Resp 16 | Ht 67.0 in | Wt 159.4 lb

## 2020-11-15 DIAGNOSIS — I700 Atherosclerosis of aorta: Secondary | ICD-10-CM

## 2020-11-15 DIAGNOSIS — D696 Thrombocytopenia, unspecified: Secondary | ICD-10-CM

## 2020-11-15 DIAGNOSIS — G4733 Obstructive sleep apnea (adult) (pediatric): Secondary | ICD-10-CM | POA: Diagnosis not present

## 2020-11-15 DIAGNOSIS — R5383 Other fatigue: Secondary | ICD-10-CM

## 2020-11-15 DIAGNOSIS — R21 Rash and other nonspecific skin eruption: Secondary | ICD-10-CM

## 2020-11-15 DIAGNOSIS — H4010X0 Unspecified open-angle glaucoma, stage unspecified: Secondary | ICD-10-CM

## 2020-11-15 MED ORDER — LATANOPROST 0.005 % OP SOLN: 1.0000 [drp] | Freq: Every day | OPHTHALMIC | 2 refills | Status: DC

## 2020-11-15 NOTE — Progress Notes (Signed)
[facility] [place],  [address]  Internal MEDICINE  Office Visit Note  Patient Name: Donald Mendoza  [id number]  [id number]  Date of Service: 11/22/2020  Chief Complaint  Patient presents with   Follow-up    Pt is always cold, discuss meds, discuss results    Gastroesophageal Reflux   Hyperlipidemia   Quality Metric Gaps    Pneumovax, shingrix    HPI Donald Mendoza presents for a follow up visit for medication review, hyperlipidemia, GERD, and a rash on his penis. Requesting medication refills.  -Rash on penis, not itchy, not painful, red area, dry. Patient reports he has not been sexually active in months. The last time he was sexually active he reports that he has received oral sex. Patient would like to be tested to rule out sexually transmitted infections that could cause a rash or lesion.  Donald Mendoza also reports being very tired, fatigued and cold which has been going on for a couple of months.  He has a history of thrombocytopenia and aortic atherosclerosis and is due for screening to assess for progression. CBC needs to be ordered to assess for resolution of thrombocytopenia.    Current Medication: Outpatient Encounter Medications as of 11/15/2020  Medication Sig   pravastatin (PRAVACHOL) 40 MG tablet TAKE 1 TABLET(40 MG) BY MOUTH DAILY   vitamin B-12 (CYANOCOBALAMIN) 1000 MCG tablet Take 1 tablet (1,000 mcg total) by mouth daily.   [DISCONTINUED] latanoprost (XALATAN) 0.005 % ophthalmic solution Place 1 drop into both eyes at bedtime.   latanoprost (XALATAN) 0.005 % ophthalmic solution Place 1 drop into both eyes at bedtime.   [DISCONTINUED] omeprazole (PRILOSEC) 20 MG capsule Take 1 capsule (20 mg total) by mouth daily. (Patient not taking: Reported on 11/15/2020)   No facility-administered encounter medications on file as of 11/15/2020.    Surgical History: Past Surgical History:  Procedure Laterality Date   COLONOSCOPY     HERNIA REPAIR      10 years ago, [place]. umbilical hernia x 2   PELVIC LYMPH NODE DISSECTION Bilateral 05/27/2017   Procedure: PELVIC LYMPH NODE DISSECTION;  Surgeon: Hollice Espy, MD;  Location: [facility] ORS;  Service: Urology;  Laterality: Bilateral;   ROBOT ASSISTED LAPAROSCOPIC RADICAL PROSTATECTOMY N/A 05/27/2017   Procedure: ROBOTIC ASSISTED LAPAROSCOPIC RADICAL PROSTATECTOMY;  Surgeon: Hollice Espy, MD;  Location: [facility] ORS;  Service: Urology;  Laterality: N/A;    Medical History: Past Medical History:  Diagnosis Date   B12 deficiency 04/21/2017   Cancer ([place]) 2018   Prostate Cancer   Diverticulosis    GERD (gastroesophageal reflux disease)    Glaucoma    Hypercholesteremia     Family History: Family History  Problem Relation Age of Onset   Diabetes Mother    Breast cancer Mother    Arthritis Father    Breast cancer Sister     Social History   Socioeconomic History   Marital status: Single    Spouse name: Not on file   Number of children: Not on file   Years of education: Not on file   Highest education level: Not on file  Occupational History   Not on file  Tobacco Use   Smoking status: Former    Packs/day: 0.75    Years: 34.00    Pack years: 25.50    Types: Cigarettes    Quit date: 06/15/2018    Years since quitting: 2.4   Smokeless tobacco: Never  Vaping Use   Vaping Use: Never used  Substance  and Sexual Activity   Alcohol use: Yes    Comment: socialy    Drug use: No   Sexual activity: Yes  Other Topics Concern   Not on file  Social History Narrative   Not on file   Social Determinants of Health   Financial Resource Strain: Not on file  Food Insecurity: Not on file  Transportation Needs: Not on file  Physical Activity: Not on file  Stress: Not on file  Social Connections: Not on file  Intimate Partner Violence: Not on file      Review of Systems  Constitutional:  Positive for fatigue. Negative for appetite change, chills, fever and unexpected weight change.   HENT: Negative.    Respiratory: Negative.  Negative for cough, chest tightness, shortness of breath and wheezing.   Cardiovascular: Negative.  Negative for chest pain.  Genitourinary:  Positive for genital sores (a small area of rash or a sore on the glans penis). Negative for dysuria, frequency, penile discharge, penile pain, penile swelling, scrotal swelling, testicular pain and urgency.  Skin:  Positive for rash.  Neurological:  Negative for dizziness, light-headedness and headaches.  Psychiatric/Behavioral:  Negative for behavioral problems. The patient is not nervous/anxious.    Vital Signs: BP 116/78   Pulse 90   Temp (!) 97.3 F (36.3 C)   Resp 16   Ht 5' 7"  (1.702 m)   Wt 159 lb 6.4 oz (72.3 kg)   SpO2 98%   BMI 24.97 kg/m    Physical Exam Constitutional:      General: He is not in acute distress.    Appearance: Normal appearance. He is normal weight. He is not ill-appearing.  HENT:     Head: Normocephalic and atraumatic.  Cardiovascular:     Rate and Rhythm: Normal rate and regular rhythm.     Pulses: Normal pulses.     Heart sounds: Normal heart sounds. No murmur heard. Pulmonary:     Effort: Pulmonary effort is normal. No respiratory distress.     Breath sounds: Normal breath sounds. No wheezing.  Genitourinary:    Penis: Circumcised. Erythema and lesions (rash, red area on glans) present. No tenderness, discharge or swelling.   Skin:    General: Skin is warm and dry.     Capillary Refill: Capillary refill takes less than 2 seconds.  Neurological:     Mental Status: He is alert and oriented to person, place, and time.  Psychiatric:        Mood and Affect: Mood normal.        Behavior: Behavior normal.     Assessment/Plan: 1. Other fatigue Labs ordered to rule out anemia, electrolyte imbalances, impaired kidney function, and vitamin deficiencies.  - CBC with Differential/Platelet - CMP14+EGFR - B12 and Folate Panel  2. Rash of penis Labs ordered to  rule out HSV, syphilis and HIV.  - HSV(herpes simplex vrs) 1+2 ab-IgM - RPR - HIV antibody (with reflex)  3. OSA (obstructive sleep apnea) He wakes up feeling fatigued daily. Sleep study ordered at previous visit, not scheduled yet.   4. Thrombocytopenia (HCC) CBC ordered to assess for resolution.   5. Aortic atherosclerosis (HCC) No recent imaging to assess cardiac function and carotid stenosis. Imaging ordered.  - US Carotid Duplex Bilateral; Future - ECHOCARDIOGRAM COMPLETE; Future  6. Open-angle glaucoma, unspecified glaucoma stage, unspecified laterality, unspecified open-angle glaucoma type Has open-angle glaucoma, eye drops refill ordered.  - latanoprost (XALATAN) 0.005 % ophthalmic solution; Place 1 drop into both eyes  at bedtime.  Dispense: 2.5 mL; Refill: 2   General Counseling: Demitrius verbalizes understanding of the findings of todays visit and agrees with plan of treatment. I have discussed any further diagnostic evaluation that may be needed or ordered today. We also reviewed his medications today. he has been encouraged to call the office with any questions or concerns that should arise related to todays visit.    Orders Placed This Encounter  Procedures   US Carotid Duplex Bilateral   HSV(herpes simplex vrs) 1+2 ab-IgM   RPR   HIV antibody (with reflex)   CBC with Differential/Platelet   CMP14+EGFR   B12 and Folate Panel   ECHOCARDIOGRAM COMPLETE    Meds ordered this encounter  Medications   latanoprost (XALATAN) 0.005 % ophthalmic solution    Sig: Place 1 drop into both eyes at bedtime.    Dispense:  2.5 mL    Refill:  2    Return in about 3 months (around 02/15/2021) for F/U, med refill, Jamison Yuhasz PCP.   Total time spent:30 Minutes Time spent includes review of chart, medications, test results, and follow up plan with the patient.   [place] Controlled Substance Database was reviewed by me.  This patient was seen by Jonetta Osgood, FNP-C in collaboration  with Dr. Clayborn Bigness as a part of collaborative care agreement.   Messiah Ahr R. Valetta Fuller, MSN, FNP-C Internal medicine

## 2020-11-25 DIAGNOSIS — M25561 Pain in right knee: Secondary | ICD-10-CM | POA: Diagnosis not present

## 2020-12-28 DIAGNOSIS — R5383 Other fatigue: Secondary | ICD-10-CM | POA: Diagnosis not present

## 2020-12-28 DIAGNOSIS — R21 Rash and other nonspecific skin eruption: Secondary | ICD-10-CM | POA: Diagnosis not present

## 2020-12-31 LAB — CMP14+EGFR
ALT: 19 IU/L (ref 0–44)
AST: 19 IU/L (ref 0–40)
Albumin/Globulin Ratio: 1.9 (ref 1.2–2.2)
Albumin: 4.3 g/dL (ref 3.8–4.9)
Alkaline Phosphatase: 108 IU/L (ref 44–121)
BUN/Creatinine Ratio: 14 (ref 9–20)
BUN: 11 mg/dL (ref 6–24)
Bilirubin Total: 0.3 mg/dL (ref 0.0–1.2)
CO2: 20 mmol/L (ref 20–29)
Calcium: 9.2 mg/dL (ref 8.7–10.2)
Chloride: 103 mmol/L (ref 96–106)
Creatinine, Ser: 0.77 mg/dL (ref 0.76–1.27)
Globulin, Total: 2.3 g/dL (ref 1.5–4.5)
Glucose: 93 mg/dL (ref 65–99)
Potassium: 4 mmol/L (ref 3.5–5.2)
Sodium: 139 mmol/L (ref 134–144)
Total Protein: 6.6 g/dL (ref 6.0–8.5)
eGFR: 104 mL/min/{1.73_m2} (ref >59)

## 2020-12-31 LAB — CBC WITH DIFFERENTIAL/PLATELET
Basophils Absolute: 0 10*3/uL (ref 0.0–0.2)
Basos: 0 %
EOS (ABSOLUTE): 0.2 10*3/uL (ref 0.0–0.4)
Eos: 4 %
Hematocrit: 41.9 % (ref 37.5–51.0)
Hemoglobin: 14 g/dL (ref 13.0–17.7)
Immature Grans (Abs): 0 10*3/uL (ref 0.0–0.1)
Immature Granulocytes: 0 %
Lymphocytes Absolute: 1.6 10*3/uL (ref 0.7–3.1)
Lymphs: 35 %
MCH: 29.2 pg (ref 26.6–33.0)
MCHC: 33.4 g/dL (ref 31.5–35.7)
MCV: 88 fL (ref 79–97)
Monocytes Absolute: 0.3 10*3/uL (ref 0.1–0.9)
Monocytes: 7 %
Neutrophils Absolute: 2.5 10*3/uL (ref 1.4–7.0)
Neutrophils: 54 %
Platelets: 81 10*3/uL — CL (ref 150–450)
RBC: 4.79 x10E6/uL (ref 4.14–5.80)
RDW: 13.2 % (ref 11.6–15.4)
WBC: 4.6 10*3/uL (ref 3.4–10.8)

## 2020-12-31 LAB — RPR: RPR Ser Ql: NONREACTIVE

## 2020-12-31 LAB — HIV ANTIBODY (ROUTINE TESTING W REFLEX): HIV Screen 4th Generation wRfx: NONREACTIVE

## 2020-12-31 LAB — B12 AND FOLATE PANEL
Folate: 14.2 ng/mL (ref >3.0)
Vitamin B-12: 490 pg/mL (ref 232–1245)

## 2020-12-31 LAB — HSV(HERPES SIMPLEX VRS) I + II AB-IGM: HSVI/II Comb IgM: 1.02 Ratio — ABNORMAL HIGH (ref 0.00–0.90)

## 2021-01-04 ENCOUNTER — Other Ambulatory Visit: Payer: BC Managed Care – PPO

## 2021-01-12 DIAGNOSIS — H401123 Primary open-angle glaucoma, left eye, severe stage: Secondary | ICD-10-CM | POA: Diagnosis not present

## 2021-01-13 ENCOUNTER — Telehealth: Payer: Self-pay

## 2021-01-13 NOTE — Telephone Encounter (Signed)
Left vm to confirm 01/18/21 ultrasound appointment-Toni

## 2021-01-18 ENCOUNTER — Other Ambulatory Visit: Payer: Self-pay

## 2021-01-18 ENCOUNTER — Ambulatory Visit: Payer: BC Managed Care – PPO

## 2021-01-18 DIAGNOSIS — I6529 Occlusion and stenosis of unspecified carotid artery: Secondary | ICD-10-CM | POA: Diagnosis not present

## 2021-01-18 DIAGNOSIS — I700 Atherosclerosis of aorta: Secondary | ICD-10-CM

## 2021-02-01 ENCOUNTER — Telehealth: Payer: Self-pay

## 2021-02-01 ENCOUNTER — Other Ambulatory Visit: Payer: Self-pay

## 2021-02-01 ENCOUNTER — Ambulatory Visit: Payer: BC Managed Care – PPO

## 2021-02-01 DIAGNOSIS — I700 Atherosclerosis of aorta: Secondary | ICD-10-CM

## 2021-02-01 NOTE — Telephone Encounter (Signed)
Pt walked in to office and c/o of having his nose run for 2 yrs now and asking what he can do.  He has an appt on 02/09/21 and will discuss with provider then.  I advised pt can try OTC claritin, allegra or zyrtec to see if that can help.  I advised for pt to check with the pharmacist also to see what will be best for his symptoms.

## 2021-02-04 NOTE — Procedures (Signed)
[place], [place] [address]  DATE OF SERVICE: January 18, 2021  CAROTID DOPPLER INTERPRETATION:  Bilateral Carotid Ultrsasound and Color Doppler Examination was performed. The RIGHT CCA shows no significant plaque in the vessel. The LEFT CCA shows no significant plaque in the vessel. There was no significant intimal thickening noted in the RIGHT carotid artery. There was no significant intimal thickening in the LEFT carotid artery.  The RIGHT CCA shows peak systolic velocity of [id number] cm per second. The end diastolic velocity is 29 cm per second on the RIGHT side. The RIGHT ICA shows peak systolic velocity of 84 per second. RIGHT sided ICA end diastolic velocity is 35 cm per second. The RIGHT ECA shows a peak systolic velocity of 92 cm per second. The ICA/CCA ratio is calculated to be 0.75. This suggests less than 50% stenosis. The Vertebral Artery shows antegrade flow.  The LEFT CCA shows peak systolic velocity of [id number] cm per second. The end diastolic velocity is 30 cm per second on the LEFT side. The LEFT ICA shows peak systolic velocity of 82 per second. LEFT sided ICA end diastolic velocity is 40 cm per second. The LEFT ECA shows a peak systolic velocity of 60 cm per second. The ICA/CCA ratio is calculated to be 0.81. This suggests less than 50% stenosis. The Vertebral Artery shows antegrade flow.   Impression:    The RIGHT CAROTID shows less than 50% stenosis. The LEFT CAROTID shows less than 50% stenosis.  There is no significant plaque formation noted on the LEFT and no significant plaque on the RIGHT  side. Consider a repeat Carotid doppler if clinical situation and symptoms warrant in 6-12 months. Patient should be encouraged to change lifestyles such as smoking cessation, regular exercise and dietary modification. Use of statins in the right clinical setting and ASA is encouraged.  Allyne Gee, MD [facility] Pulmonary Critical Care Medicine

## 2021-02-09 ENCOUNTER — Other Ambulatory Visit: Payer: Self-pay

## 2021-02-09 ENCOUNTER — Encounter: Payer: Self-pay | Admitting: Nurse Practitioner

## 2021-02-09 ENCOUNTER — Ambulatory Visit (INDEPENDENT_AMBULATORY_CARE_PROVIDER_SITE_OTHER): Payer: BC Managed Care – PPO | Admitting: Nurse Practitioner

## 2021-02-09 VITALS — BP 108/70 | HR 80 | Temp 98.4°F | Resp 16 | Ht 67.0 in | Wt 151.2 lb

## 2021-02-09 DIAGNOSIS — M25571 Pain in right ankle and joints of right foot: Secondary | ICD-10-CM | POA: Diagnosis not present

## 2021-02-09 DIAGNOSIS — H4010X0 Unspecified open-angle glaucoma, stage unspecified: Secondary | ICD-10-CM | POA: Diagnosis not present

## 2021-02-09 DIAGNOSIS — M79604 Pain in right leg: Secondary | ICD-10-CM | POA: Diagnosis not present

## 2021-02-09 DIAGNOSIS — I700 Atherosclerosis of aorta: Secondary | ICD-10-CM

## 2021-02-09 DIAGNOSIS — M79605 Pain in left leg: Secondary | ICD-10-CM

## 2021-02-09 DIAGNOSIS — M25471 Effusion, right ankle: Secondary | ICD-10-CM

## 2021-02-09 DIAGNOSIS — E7800 Pure hypercholesterolemia, unspecified: Secondary | ICD-10-CM | POA: Diagnosis not present

## 2021-02-09 MED ORDER — LATANOPROST 0.005 % OP SOLN: 1.0000 [drp] | Freq: Every day | OPHTHALMIC | 2 refills | Status: DC

## 2021-02-09 MED ORDER — PRAVASTATIN SODIUM 40 MG PO TABS: ORAL_TABLET | ORAL | 2 refills | Status: DC

## 2021-02-09 NOTE — Progress Notes (Signed)
[name], [place] [address]  Internal MEDICINE  Office Visit Note  Patient Name: Donald Mendoza  [id number]  [id number]  Date of Service: 02/09/2021  Chief Complaint  Patient presents with   Follow-up    Right ankle swelling, noticed about 2 weeks ago, hurts at night and in the morning has difficulty walking, sleepy, fell asleep while driving, started 2 weeks ago, refills, review results    Hyperlipidemia    HPI Donald Mendoza presents for a follow up visit to discuss ultrasound results and he has significant right ankle swelling and pain. Both ankles hurt at night and he has difficulty walk in the morning. The right ankle is worse than the left. This has been going on for a couple of weeks now.  -bilateral carotid ultrasound done on 01/18/21 showed less than 50% stenosis bilaterally.  -echocardiogram was done on 02/01/21 and the results showed borderline right atrial dilation, slight left atrial dilation, mild mitral regurgitation, mild pulmonary arterial hypertension and impaired relaxation of the right ventricle during diastolic filling. LVEF is 64.29% which is normal.       Current Medication: Outpatient Encounter Medications as of 02/09/2021  Medication Sig   vitamin B-12 (CYANOCOBALAMIN) 1000 MCG tablet Take 1 tablet (1,000 mcg total) by mouth daily.   [DISCONTINUED] latanoprost (XALATAN) 0.005 % ophthalmic solution Place 1 drop into both eyes at bedtime.   [DISCONTINUED] pravastatin (PRAVACHOL) 40 MG tablet TAKE 1 TABLET(40 MG) BY MOUTH DAILY   latanoprost (XALATAN) 0.005 % ophthalmic solution Place 1 drop into both eyes at bedtime.   pravastatin (PRAVACHOL) 40 MG tablet TAKE 1 TABLET(40 MG) BY MOUTH DAILY   No facility-administered encounter medications on file as of 02/09/2021.    Surgical History: Past Surgical History:  Procedure Laterality Date   COLONOSCOPY     HERNIA REPAIR     10 years ago, [place]. umbilical hernia x 2   PELVIC LYMPH  NODE DISSECTION Bilateral 05/27/2017   Procedure: PELVIC LYMPH NODE DISSECTION;  Surgeon: Hollice Espy, MD;  Location: [facility] ORS;  Service: Urology;  Laterality: Bilateral;   ROBOT ASSISTED LAPAROSCOPIC RADICAL PROSTATECTOMY N/A 05/27/2017   Procedure: ROBOTIC ASSISTED LAPAROSCOPIC RADICAL PROSTATECTOMY;  Surgeon: Hollice Espy, MD;  Location: [facility] ORS;  Service: Urology;  Laterality: N/A;    Medical History: Past Medical History:  Diagnosis Date   B12 deficiency 04/21/2017   Cancer ([place]) 2018   Prostate Cancer   Diverticulosis    GERD (gastroesophageal reflux disease)    Glaucoma    Hypercholesteremia     Family History: Family History  Problem Relation Age of Onset   Diabetes Mother    Breast cancer Mother    Arthritis Father    Breast cancer Sister     Social History   Socioeconomic History   Marital status: Single    Spouse name: Not on file   Number of children: Not on file   Years of education: Not on file   Highest education level: Not on file  Occupational History   Not on file  Tobacco Use   Smoking status: Former    Packs/day: 0.75    Years: 34.00    Pack years: 25.50    Types: Cigarettes    Quit date: 06/15/2018    Years since quitting: 2.6   Smokeless tobacco: Never  Vaping Use   Vaping Use: Never used  Substance and Sexual Activity   Alcohol use: Yes    Comment: socialy    Drug  use: No   Sexual activity: Yes  Other Topics Concern   Not on file  Social History Narrative   Not on file   Social Determinants of Health   Financial Resource Strain: Not on file  Food Insecurity: Not on file  Transportation Needs: Not on file  Physical Activity: Not on file  Stress: Not on file  Social Connections: Not on file  Intimate Partner Violence: Not on file      Review of Systems  Constitutional:  Negative for chills, fatigue and unexpected weight change.  HENT:  Negative for congestion, rhinorrhea, sneezing and sore throat.   Eyes:  Negative for  redness.  Respiratory:  Negative for cough, chest tightness and shortness of breath.   Cardiovascular:  Negative for chest pain and palpitations.  Gastrointestinal:  Negative for abdominal pain, constipation, diarrhea, nausea and vomiting.  Genitourinary:  Negative for dysuria and frequency.  Musculoskeletal:  Positive for arthralgias and joint swelling (right ankle). Negative for back pain and neck pain.  Skin:  Negative for rash.  Neurological: Negative.  Negative for tremors and numbness.  Hematological:  Negative for adenopathy. Does not bruise/bleed easily.  Psychiatric/Behavioral:  Negative for behavioral problems (Depression), sleep disturbance and suicidal ideas. The patient is not nervous/anxious.    Vital Signs: BP 108/70   Pulse 80   Temp 98.4 F (36.9 C)   Resp 16   Ht 5\' 7"  (1.702 m)   Wt 151 lb 3.2 oz (68.6 kg)   SpO2 98%   BMI 23.68 kg/m    Physical Exam Constitutional:      General: He is not in acute distress.    Appearance: Normal appearance. He is normal weight. He is not ill-appearing.  HENT:     Head: Normocephalic and atraumatic.  Eyes:     Extraocular Movements: Extraocular movements intact.     Pupils: Pupils are equal, round, and reactive to light.  Cardiovascular:     Rate and Rhythm: Normal rate and regular rhythm.     Pulses: Normal pulses.     Heart sounds: Normal heart sounds. No murmur heard. Pulmonary:     Effort: Pulmonary effort is normal. No respiratory distress.     Breath sounds: Normal breath sounds. No wheezing.  Skin:    General: Skin is warm and dry.     Capillary Refill: Capillary refill takes less than 2 seconds.  Neurological:     Mental Status: He is alert and oriented to person, place, and time.     Cranial Nerves: No cranial nerve deficit.     Coordination: Coordination normal.     Gait: Gait normal.  Psychiatric:        Mood and Affect: Mood normal.        Behavior: Behavior normal.       Assessment/Plan: 1.  Aortic atherosclerosis (HCC) Taking pravastatin, echocardiogram done, results discussed, carotid ultrasound done, results discussed. Repeat echo in 3 years for MR.  - pravastatin (PRAVACHOL) 40 MG tablet; TAKE 1 TABLET(40 MG) BY MOUTH DAILY  Dispense: 30 tablet; Refill: 2  2. Bilateral leg pain ABI was normal - POCT ABI Screening Pilot No Charge  3. Pain and swelling of right ankle Significant swelling of right ankle, rule out fracture or other structural abnormality - DG Ankle Complete Right; Future  4. Open-angle glaucoma, unspecified glaucoma stage, unspecified laterality, unspecified open-angle glaucoma type Chronic problem,  - latanoprost (XALATAN) 0.005 % ophthalmic solution; Place 1 drop into both eyes at bedtime.  Dispense:  2.5 mL; Refill: 2   General Counseling: Ermin verbalizes understanding of the findings of todays visit and agrees with plan of treatment. I have discussed any further diagnostic evaluation that may be needed or ordered today. We also reviewed his medications today. he has been encouraged to call the office with any questions or concerns that should arise related to todays visit.    Orders Placed This Encounter  Procedures   DG Ankle Complete Right   POCT ABI Screening Pilot No Charge    Meds ordered this encounter  Medications   pravastatin (PRAVACHOL) 40 MG tablet    Sig: TAKE 1 TABLET(40 MG) BY MOUTH DAILY    Dispense:  30 tablet    Refill:  2   latanoprost (XALATAN) 0.005 % ophthalmic solution    Sig: Place 1 drop into both eyes at bedtime.    Dispense:  2.5 mL    Refill:  2    Return in about 6 months (around 08/09/2021) for F/U, med refill, Garrett Bowring PCP.   Total time spent:30 Minutes Time spent includes review of chart, medications, test results, and follow up plan with the patient.   [place] Controlled Substance Database was reviewed by me.  This patient was seen by Jonetta Osgood, FNP-C in collaboration with Dr. Clayborn Bigness as a part of  collaborative care agreement.   Kaelene Elliston R. Valetta Fuller, MSN, FNP-C Internal medicine

## 2021-02-13 DIAGNOSIS — M76821 Posterior tibial tendinitis, right leg: Secondary | ICD-10-CM | POA: Diagnosis not present

## 2021-02-14 NOTE — Progress Notes (Signed)
02/15/21 2:56 PM   Kristoph Fredrich Birks 1960/05/31 [id number]  Referring provider:  Ronnell Freshwater, NP [address] [place],  [place] [address] Chief Complaint  Patient presents with   Elevated PSA     HPI: Donald Mendoza is a 60 y.o.male with a personal history of prostate cancer, ED after radical prostatectomy, and stress incontinence, who presents today for follow-up with PSA.   He is s/p radical robotic prostatectomy on 05/27/2017. He underwent a full nerve sparing procedure on the left and partial nerve sparing on the right.  Lymph node dissection was also performed.  His surgical pathology is consistent with Gleason 4+3 prostate cancer, large volume of Gleason 4 component greater than 60% of the right nodule.  No extracapsular extension, seminal vesicle invasion, bladder neck invasion.  Margins were negative.  Lymph nodes negative but the left-sided lymph node dissection was somewhat limited due to presence of mesh.  PSA in 05/2016 was 5.2 preoperatively and as of 09/05/2020 has remained undetectable. PSa pending today.  He is doing well with no new urinary symptoms.  Mild ED, able to achieve erections spontaneously but sometimes needs generic Viagra.  Requests refills today.  PSA trend  Component Prostate Specific Ag, Serum  Latest Ref Rng & Units ng/mL  02/22/2017 5.2 (H)  06/25/2017 <0.1  12/18/2017 <0.1  06/26/2018 <0.1  02/03/2019 <0.1  08/10/2019 <0.1  02/10/2020 <0.1  09/05/2020 <0.1     PMH: Past Medical History:  Diagnosis Date   B12 deficiency 04/21/2017   Cancer [facility]) 2018   Prostate Cancer   Diverticulosis    GERD (gastroesophageal reflux disease)    Glaucoma    Hypercholesteremia     Surgical History: Past Surgical History:  Procedure Laterality Date   COLONOSCOPY     HERNIA REPAIR     10 years ago, [place]. umbilical hernia x 2   PELVIC LYMPH NODE DISSECTION Bilateral 05/27/2017   Procedure: PELVIC LYMPH NODE DISSECTION;  Surgeon: Hollice Espy, MD;  Location: [facility] ORS;  Service: Urology;  Laterality: Bilateral;   ROBOT ASSISTED LAPAROSCOPIC RADICAL PROSTATECTOMY N/A 05/27/2017   Procedure: ROBOTIC ASSISTED LAPAROSCOPIC RADICAL PROSTATECTOMY;  Surgeon: Hollice Espy, MD;  Location: [facility] OR[facility];  Service: Urology;  Laterality: N/A;    Home Medications:  Allergies as of 02/15/2021       Reactions   Sulfa Antibiotics Hives        Medication List        Accurate as of February 15, 2021  2:56 PM. If you have any questions, ask your nurse or doctor.          latanoprost 0.005 % ophthalmic solution Commonly known as: XALATAN Place 1 drop into both eyes at bedtime.   meloxicam 15 MG tablet Commonly known as: MOBIC Take 15 mg by mouth daily.   pravastatin 40 MG tablet Commonly known as: PRAVACHOL TAKE 1 TABLET(40 MG) BY MOUTH DAILY   sildenafil 20 MG tablet Commonly known as: REVATIO Take 1 tablet (20 mg total) by mouth 3 (three) times daily. Started by: Hollice Espy, MD   vitamin B-12 1000 MCG tablet Commonly known as: CYANOCOBALAMIN Take 1 tablet (1,000 mcg total) by mouth daily.        Allergies:  Allergies  Allergen Reactions   Sulfa Antibiotics Hives    Family History: Family History  Problem Relation Age of Onset   Diabetes Mother    Breast cancer Mother    Arthritis Father    Breast  cancer Sister     Social History:  reports that he quit smoking about 2 years ago. His smoking use included cigarettes. He has a 25.50 pack-year smoking history. He has never used smokeless tobacco. He reports current alcohol use. He reports that he does not use drugs.   Physical Exam: BP 133/85   Pulse 88   Ht 5\' 7"  (1.702 m)   Wt 151 lb (68.5 kg)   BMI 23.65 kg/m   Constitutional:  Alert and oriented, No acute distress. HEENT: [place] AT, moist mucus membranes.  Trachea midline, no masses. Cardiovascular: No clubbing, cyanosis, or edema. Respiratory: Normal respiratory effort, no increased work of  breathing. Skin: No rashes, bruises or suspicious lesions. Neurologic: Grossly intact, no focal deficits, moving all 4 extremities. Psychiatric: Normal mood and affect.  Laboratory Data:  Lab Results  Component Value Date   CREATININE 0.77 12/28/2020    Lab Results  Component Value Date   HGBA1C 6.1 (H) 08/02/2020    Assessment & Plan:   Prostate cancer  -PSA remain undetectable  - Monitor annually, PSA; Future   ED after radical prostatectomy  - Continue sildenafil; refill sent today    Return in about 1 year (around 02/15/2022).  I,Kailey Littlejohn,acting as a Education administrator for Hollice Espy, MD.,have documented all relevant documentation on the behalf of Hollice Espy, MD,as directed by  Hollice Espy, MD while in the presence of Hollice Espy, MD.  I have reviewed the above documentation for accuracy and completeness, and I agree with the above.   Hollice Espy, MD  [facility] [address] [place], [place] [address] [phone]

## 2021-02-15 ENCOUNTER — Encounter: Payer: Self-pay | Admitting: Urology

## 2021-02-15 ENCOUNTER — Other Ambulatory Visit: Payer: Self-pay

## 2021-02-15 ENCOUNTER — Ambulatory Visit (INDEPENDENT_AMBULATORY_CARE_PROVIDER_SITE_OTHER): Payer: BC Managed Care – PPO | Admitting: Urology

## 2021-02-15 VITALS — BP 133/85 | HR 88 | Ht 67.0 in | Wt 151.0 lb

## 2021-02-15 DIAGNOSIS — C61 Malignant neoplasm of prostate: Secondary | ICD-10-CM | POA: Diagnosis not present

## 2021-02-15 MED ORDER — SILDENAFIL CITRATE 20 MG PO TABS: 20.0000 mg | ORAL_TABLET | Freq: Three times a day (TID) | ORAL | 3 refills | Status: DC

## 2021-02-16 LAB — PSA: Prostate Specific Ag, Serum: <0.1 ng/mL (ref 0.0–4.0)

## 2021-02-22 DIAGNOSIS — M76821 Posterior tibial tendinitis, right leg: Secondary | ICD-10-CM | POA: Diagnosis not present

## 2021-03-03 ENCOUNTER — Inpatient Hospital Stay: Payer: BC Managed Care – PPO | Attending: Oncology

## 2021-03-03 ENCOUNTER — Other Ambulatory Visit: Payer: Self-pay

## 2021-03-03 DIAGNOSIS — Z87891 Personal history of nicotine dependence: Secondary | ICD-10-CM | POA: Insufficient documentation

## 2021-03-03 DIAGNOSIS — C61 Malignant neoplasm of prostate: Secondary | ICD-10-CM

## 2021-03-03 DIAGNOSIS — D696 Thrombocytopenia, unspecified: Secondary | ICD-10-CM | POA: Diagnosis not present

## 2021-03-03 DIAGNOSIS — Z8546 Personal history of malignant neoplasm of prostate: Secondary | ICD-10-CM | POA: Diagnosis not present

## 2021-03-03 DIAGNOSIS — Z79899 Other long term (current) drug therapy: Secondary | ICD-10-CM | POA: Diagnosis not present

## 2021-03-03 DIAGNOSIS — E538 Deficiency of other specified B group vitamins: Secondary | ICD-10-CM | POA: Diagnosis not present

## 2021-03-03 LAB — COMPREHENSIVE METABOLIC PANEL
ALT: 20 U/L (ref 0–44)
AST: 21 U/L (ref 15–41)
Albumin: 4.1 g/dL (ref 3.5–5.0)
Alkaline Phosphatase: 111 U/L (ref 38–126)
Anion gap: 7 (ref 5–15)
BUN: 14 mg/dL (ref 6–20)
CO2: 28 mmol/L (ref 22–32)
Calcium: 9 mg/dL (ref 8.9–10.3)
Chloride: 102 mmol/L (ref 98–111)
Creatinine, Ser: 0.86 mg/dL (ref 0.61–1.24)
GFR, Estimated: >60 mL/min (ref >60)
Glucose, Bld: 142 mg/dL — ABNORMAL HIGH (ref 70–99)
Potassium: 3.8 mmol/L (ref 3.5–5.1)
Sodium: 137 mmol/L (ref 135–145)
Total Bilirubin: 0.3 mg/dL (ref 0.3–1.2)
Total Protein: 7 g/dL (ref 6.5–8.1)

## 2021-03-03 LAB — CBC WITH DIFFERENTIAL/PLATELET
Abs Immature Granulocytes: 0.03 10*3/uL (ref 0.00–0.07)
Basophils Absolute: 0 10*3/uL (ref 0.0–0.1)
Basophils Relative: 0 %
Eosinophils Absolute: 0.3 10*3/uL (ref 0.0–0.5)
Eosinophils Relative: 5 %
HCT: 41.7 % (ref 39.0–52.0)
Hemoglobin: 14.1 g/dL (ref 13.0–17.0)
Immature Granulocytes: 1 %
Lymphocytes Relative: 33 %
Lymphs Abs: 1.7 10*3/uL (ref 0.7–4.0)
MCH: 29.5 pg (ref 26.0–34.0)
MCHC: 33.8 g/dL (ref 30.0–36.0)
MCV: 87.2 fL (ref 80.0–100.0)
Monocytes Absolute: 0.3 10*3/uL (ref 0.1–1.0)
Monocytes Relative: 5 %
Neutro Abs: 2.7 10*3/uL (ref 1.7–7.7)
Neutrophils Relative %: 56 %
Platelets: 115 10*3/uL — ABNORMAL LOW (ref 150–400)
RBC: 4.78 MIL/uL (ref 4.22–5.81)
RDW: 13.7 % (ref 11.5–15.5)
Smear Review: NORMAL
WBC: 4.9 10*3/uL (ref 4.0–10.5)
nRBC: 0 % (ref 0.0–0.2)

## 2021-03-03 LAB — PSA: Prostatic Specific Antigen: <0.01 ng/mL (ref 0.00–4.00)

## 2021-03-03 LAB — VITAMIN B12: Vitamin B-12: 262 pg/mL (ref 180–914)

## 2021-03-06 ENCOUNTER — Inpatient Hospital Stay: Payer: BC Managed Care – PPO | Admitting: Oncology

## 2021-03-23 ENCOUNTER — Other Ambulatory Visit: Payer: Self-pay

## 2021-03-23 ENCOUNTER — Encounter: Payer: Self-pay | Admitting: Oncology

## 2021-03-23 ENCOUNTER — Inpatient Hospital Stay: Payer: BC Managed Care – PPO | Attending: Oncology | Admitting: Oncology

## 2021-03-23 VITALS — BP 113/68 | HR 71 | Temp 97.9°F | Wt 158.0 lb

## 2021-03-23 DIAGNOSIS — Z809 Family history of malignant neoplasm, unspecified: Secondary | ICD-10-CM | POA: Diagnosis not present

## 2021-03-23 DIAGNOSIS — Z1211 Encounter for screening for malignant neoplasm of colon: Secondary | ICD-10-CM

## 2021-03-23 DIAGNOSIS — R1319 Other dysphagia: Secondary | ICD-10-CM | POA: Insufficient documentation

## 2021-03-23 DIAGNOSIS — D693 Immune thrombocytopenic purpura: Secondary | ICD-10-CM | POA: Diagnosis not present

## 2021-03-23 DIAGNOSIS — C61 Malignant neoplasm of prostate: Secondary | ICD-10-CM | POA: Diagnosis not present

## 2021-03-23 DIAGNOSIS — Z9079 Acquired absence of other genital organ(s): Secondary | ICD-10-CM | POA: Insufficient documentation

## 2021-03-23 DIAGNOSIS — Z79899 Other long term (current) drug therapy: Secondary | ICD-10-CM | POA: Insufficient documentation

## 2021-03-23 DIAGNOSIS — Z8546 Personal history of malignant neoplasm of prostate: Secondary | ICD-10-CM | POA: Insufficient documentation

## 2021-03-23 DIAGNOSIS — Z803 Family history of malignant neoplasm of breast: Secondary | ICD-10-CM | POA: Insufficient documentation

## 2021-03-23 DIAGNOSIS — Z87891 Personal history of nicotine dependence: Secondary | ICD-10-CM | POA: Insufficient documentation

## 2021-03-23 DIAGNOSIS — E538 Deficiency of other specified B group vitamins: Secondary | ICD-10-CM | POA: Diagnosis not present

## 2021-03-23 MED ORDER — VITAMIN B-12 1000 MCG PO TABS: 1000.0000 ug | ORAL_TABLET | Freq: Every day | ORAL | 1 refills | Status: DC

## 2021-03-23 NOTE — Progress Notes (Signed)
Hematology/Oncology  Follow up note [facility] Telephone:([phone] Fax:([phone]) [phone]   Patient Care Team: Lavera Guise, MD as PCP - General (Internal Medicine) Christie Nottingham, PA as Referring Physician (Physician Assistant) Christene Lye, MD (General Surgery) Earlie Server, MD as Consulting Physician (Oncology)  PURPOSE OF CONSULTATION:  Follow up of management of thrombocytopenia.  HISTORY OF PRESENTING ILLNESS:  Donald Mendoza is a  60 y.o.  male with PMH listed below who was referred to me for evaluation of thrombocytopenia. Patient was recently diagnosed with intermediate risk prostate cancer in the follows up with Dr. Erlene Quan. Initially he was diagnosed with a low-risk prostate cancer Gleason score 3+3 in March 2018 when his PSA was 4. PSA continued to rise to 5.2 and patient had a repeat prostate biopsy revealing Gleason score 4+3. Patient was scheduled for radical prostatectomy however was found to have thrombocytopenia. Referred to [place] to evaluation for the low platelet count. Patient reports that he has been told that his platelet has been low in the past. He never received any treatment.  # S/p RALP with BPLND 05/27/2017, pathology showed Gleason 4+3, pT2 N0, NEGATIVE FOR EXTRAPROSTATIC EXTENSION, SEMINAL VESICLE INVASION, AND BLADDER NECK INVASION. - MARGINS NEGATIVE FOR CARCINOMA.  # Prostate cancer pT2N0, PSA <0.1, no adverse features, , no adjuvant treatment was offered.  # 11/15/2017 bone marrow biopsy showed normocellular bone marrow for age with trilineage hematopoiesis.  Bone marrow showed megakaryocytes with predominantly normal morphology.  Suggesting peripheral destruction, sequestration or consumption of platelets. Bone marrow cytogenetics were normal. Discussed with patient about his bone marrow biopsy results.  His chronic thrombocytopenia most likely secondary to ITP, possibly due to underlying autoimmune process.    INTERVAL  HISTORY 60 y.o. male with history of prostate cancer s/p prostectomy, ITP presents for follow-up.  Patient reports doing well.  Denies any bleeding events.  He follows up with urology for history of prostate cancer. Noticed a growth of her rectal area.  No black or bloody stool. Denies any unintentional weight loss, nausea vomiting diarrhea fever or chills. Patient drinks a few times per week. Dysphagia symptom has improved.  Review of Systems  Constitutional:  Negative for chills, fever, malaise/fatigue and weight loss.  HENT:  Negative for sore throat.   Eyes:  Negative for redness.  Respiratory:  Negative for cough, shortness of breath and wheezing.   Cardiovascular:  Negative for chest pain, palpitations and leg swelling.  Gastrointestinal:  Negative for abdominal pain, blood in stool, nausea and vomiting.       Dysphagia  Genitourinary:  Negative for dysuria.  Musculoskeletal:  Negative for myalgias.  Skin:  Negative for rash.  Neurological:  Negative for dizziness, tingling and tremors.  Endo/Heme/Allergies:  Does not bruise/bleed easily.  Psychiatric/Behavioral:  Negative for hallucinations.    MEDICAL HISTORY:  Past Medical History:  Diagnosis Date   B12 deficiency 04/21/2017   Cancer [facility]) 2018   Prostate Cancer   Diverticulosis    GERD (gastroesophageal reflux disease)    Glaucoma    Hypercholesteremia     SURGICAL HISTORY: Past Surgical History:  Procedure Laterality Date   COLONOSCOPY     HERNIA REPAIR     10 years ago, [facility]. umbilical hernia x 2   PELVIC LYMPH NODE DISSECTION Bilateral 05/27/2017   Procedure: PELVIC LYMPH NODE DISSECTION;  Surgeon: Hollice Espy, MD;  Location: [facility] ORS;  Service: Urology;  Laterality: Bilateral;   ROBOT ASSISTED LAPAROSCOPIC RADICAL PROSTATECTOMY N/A 05/27/2017  Procedure: ROBOTIC ASSISTED LAPAROSCOPIC RADICAL PROSTATECTOMY;  Surgeon: Hollice Espy, MD;  Location: [facility] ORS;  Service: Urology;  Laterality: N/A;    SOCIAL  HISTORY: Social History   Socioeconomic History   Marital status: Single    Spouse name: Not on file   Number of children: Not on file   Years of education: Not on file   Highest education level: Not on file  Occupational History   Not on file  Tobacco Use   Smoking status: Former    Packs/day: 0.75    Years: 34.00    Pack years: 25.50    Types: Cigarettes    Quit date: 06/15/2018    Years since quitting: 2.7   Smokeless tobacco: Never  Vaping Use   Vaping Use: Never used  Substance and Sexual Activity   Alcohol use: Yes    Comment: socialy    Drug use: No   Sexual activity: Yes  Other Topics Concern   Not on file  Social History Narrative   Not on file   Social Determinants of Health   Financial Resource Strain: Not on file  Food Insecurity: Not on file  Transportation Needs: Not on file  Physical Activity: Not on file  Stress: Not on file  Social Connections: Not on file  Intimate Partner Violence: Not on file    FAMILY HISTORY: Family History  Problem Relation Age of Onset   Diabetes Mother    Breast cancer Mother    Arthritis Father    Breast cancer Sister     ALLERGIES:  is allergic to sulfa antibiotics.  MEDICATIONS:  Current Outpatient Medications  Medication Sig Dispense Refill   latanoprost (XALATAN) 0.005 % ophthalmic solution Place 1 drop into both eyes at bedtime. 2.5 mL 2   meloxicam (MOBIC) 15 MG tablet Take 15 mg by mouth daily.     pravastatin (PRAVACHOL) 40 MG tablet TAKE 1 TABLET(40 MG) BY MOUTH DAILY 30 tablet 2   sildenafil (REVATIO) 20 MG tablet Take 1 tablet (20 mg total) by mouth 3 (three) times daily. 60 tablet 3   vitamin B-12 (CYANOCOBALAMIN) 1000 MCG tablet Take 1 tablet (1,000 mcg total) by mouth daily. 90 tablet 1   No current facility-administered medications for this visit.     PHYSICAL EXAMINATION: ECOG PERFORMANCE STATUS: 0 - Asymptomatic Vitals:   03/23/21 1432  BP: 113/68  Pulse: 71  Temp: 97.9 F (36.6 C)    Filed Weights   03/23/21 1432  Weight: 158 lb (71.7 kg)    Physical Exam Constitutional:      General: He is not in acute distress.    Appearance: He is not diaphoretic.  HENT:     Head: Normocephalic and atraumatic.     Mouth/Throat:     Pharynx: No oropharyngeal exudate.  Eyes:     General: No scleral icterus.    Pupils: Pupils are equal, round, and reactive to light.  Cardiovascular:     Rate and Rhythm: Normal rate and regular rhythm.     Heart sounds: No murmur heard. Pulmonary:     Effort: Pulmonary effort is normal. No respiratory distress.     Breath sounds: No rales.  Chest:     Chest wall: No tenderness.  Abdominal:     General: There is no distension.     Palpations: Abdomen is soft.     Tenderness: There is no abdominal tenderness.  Musculoskeletal:        General: Normal range of motion.  Cervical back: Normal range of motion and neck supple.  Skin:    General: Skin is warm and dry.     Findings: No erythema.  Neurological:     Mental Status: He is alert and oriented to person, place, and time.     Cranial Nerves: No cranial nerve deficit.     Motor: No abnormal muscle tone.     Coordination: Coordination normal.  Psychiatric:        Mood and Affect: Affect normal.     LABORATORY DATA:  I have reviewed the data as listed Lab Results  Component Value Date   WBC 4.9 03/03/2021   HGB 14.1 03/03/2021   HCT 41.7 03/03/2021   MCV 87.2 03/03/2021   PLT 115 (L) 03/03/2021   Recent Labs    07/08/20 1344 08/02/20 0841 09/02/20 1455 12/28/20 1101 03/03/21 1419  NA 137   < > 138 139 137  K 3.9   < > 3.7 4.0 3.8  CL 101   < > 106 103 102  CO2 29   < > _0 GLUCOSE 107*   < > 95 93 142*  BUN 10   < > _1 CREATININE 0.83   < > 0.88 0.77 0.86  CALCIUM 9.0   < > 9.0 9.2 9.0  GFRNONAA >60  --  >60  --  >60  PROT 7.0   < > 6.9 6.6 7.0  ALBUMIN 3.9   < > 4.5 4.3 4.1  AST 18   < > _2 ALT 21   < > _3 ALKPHOS 85   < > 93  108 111  BILITOT 0.4   < > 0.3 0.3 0.3   < > = values in this interval not displayed.       ASSESSMENT & PLAN:  1. Personal history of tobacco use, presenting hazards to health   2. Prostate cancer ([place])   3. Encounter for screening colonoscopy   4. Family history of cancer   5. Other dysphagia     # Chronic thrombocytopenia:  ITP,  Labs are reviewed and discussed with patient. Platelet count has improved.  Continue observation.  # Prostate Cancer pT2(m) pN0 PSA 5.2 gleason score 4+3, grade group 3--Stage IIC, unfavorable intermediate risk group. status post prostatectomy BPLND .   PSA < 0.01 he follows up with urology Dr. Erlene Quan annually.  # History of vitamin B12 deficiency, B12 level is 262, this is likely due to chronic alcohol use.  Continue B12 1000 MCG daily. Discussed about alcohol cessation.  #History of dysphagia.  Also reporting growth of her rectal/anal area.  Patient has not had any recent colonoscopy done.  I will refer patient to establish care with gastroenterology.  #Family history of breast cancer and personal history of prostate cancer.  Discussed with patient about genetic testing and he is in agreement.  Refer to Dietitian.  Return of visit: 6 months.  We spent sufficient time to discuss many aspect of care, questions were answered to patient's satisfaction.  Earlie Server, MD, PhD 03/23/2021

## 2021-03-28 DIAGNOSIS — M76821 Posterior tibial tendinitis, right leg: Secondary | ICD-10-CM | POA: Diagnosis not present

## 2021-03-29 DIAGNOSIS — M76821 Posterior tibial tendinitis, right leg: Secondary | ICD-10-CM | POA: Diagnosis not present

## 2021-04-18 DIAGNOSIS — M2141 Flat foot [pes planus] (acquired), right foot: Secondary | ICD-10-CM | POA: Diagnosis not present

## 2021-04-18 DIAGNOSIS — M21071 Valgus deformity, not elsewhere classified, right ankle: Secondary | ICD-10-CM | POA: Diagnosis not present

## 2021-04-18 DIAGNOSIS — M19071 Primary osteoarthritis, right ankle and foot: Secondary | ICD-10-CM | POA: Diagnosis not present

## 2021-04-18 DIAGNOSIS — M76821 Posterior tibial tendinitis, right leg: Secondary | ICD-10-CM | POA: Diagnosis not present

## 2021-04-19 ENCOUNTER — Inpatient Hospital Stay: Payer: BC Managed Care – PPO | Admitting: Licensed Clinical Social Worker

## 2021-04-19 ENCOUNTER — Inpatient Hospital Stay: Payer: BC Managed Care – PPO

## 2021-04-27 ENCOUNTER — Other Ambulatory Visit: Payer: Self-pay

## 2021-04-27 DIAGNOSIS — I700 Atherosclerosis of aorta: Secondary | ICD-10-CM

## 2021-04-27 MED ORDER — PRAVASTATIN SODIUM 40 MG PO TABS: ORAL_TABLET | ORAL | 5 refills | Status: DC

## 2021-05-24 DIAGNOSIS — Z8546 Personal history of malignant neoplasm of prostate: Secondary | ICD-10-CM | POA: Diagnosis not present

## 2021-05-24 DIAGNOSIS — M21071 Valgus deformity, not elsewhere classified, right ankle: Secondary | ICD-10-CM | POA: Diagnosis not present

## 2021-05-24 DIAGNOSIS — M19071 Primary osteoarthritis, right ankle and foot: Secondary | ICD-10-CM | POA: Diagnosis not present

## 2021-05-24 DIAGNOSIS — M25571 Pain in right ankle and joints of right foot: Secondary | ICD-10-CM | POA: Diagnosis not present

## 2021-05-24 DIAGNOSIS — M6701 Short Achilles tendon (acquired), right ankle: Secondary | ICD-10-CM | POA: Diagnosis not present

## 2021-05-24 DIAGNOSIS — M216X1 Other acquired deformities of right foot: Secondary | ICD-10-CM | POA: Diagnosis not present

## 2021-05-24 DIAGNOSIS — G8918 Other acute postprocedural pain: Secondary | ICD-10-CM | POA: Diagnosis not present

## 2021-05-24 DIAGNOSIS — M76821 Posterior tibial tendinitis, right leg: Secondary | ICD-10-CM | POA: Diagnosis not present

## 2021-05-24 DIAGNOSIS — M2141 Flat foot [pes planus] (acquired), right foot: Secondary | ICD-10-CM | POA: Diagnosis not present

## 2021-05-24 HISTORY — PX: FOOT SURGERY: SHX648

## 2021-05-29 ENCOUNTER — Other Ambulatory Visit: Payer: Self-pay | Admitting: *Deleted

## 2021-05-29 MED ORDER — VITAMIN B-12 1000 MCG PO TABS: 1000.0000 ug | ORAL_TABLET | Freq: Every day | ORAL | 1 refills | Status: DC

## 2021-06-13 DIAGNOSIS — M2141 Flat foot [pes planus] (acquired), right foot: Secondary | ICD-10-CM | POA: Diagnosis not present

## 2021-06-13 DIAGNOSIS — M21071 Valgus deformity, not elsewhere classified, right ankle: Secondary | ICD-10-CM | POA: Diagnosis not present

## 2021-06-13 DIAGNOSIS — G8918 Other acute postprocedural pain: Secondary | ICD-10-CM | POA: Diagnosis not present

## 2021-06-27 DIAGNOSIS — M21961 Unspecified acquired deformity of right lower leg: Secondary | ICD-10-CM | POA: Diagnosis not present

## 2021-07-10 DIAGNOSIS — M21071 Valgus deformity, not elsewhere classified, right ankle: Secondary | ICD-10-CM | POA: Diagnosis not present

## 2021-07-10 DIAGNOSIS — M2141 Flat foot [pes planus] (acquired), right foot: Secondary | ICD-10-CM | POA: Diagnosis not present

## 2021-07-14 DIAGNOSIS — H401123 Primary open-angle glaucoma, left eye, severe stage: Secondary | ICD-10-CM | POA: Diagnosis not present

## 2021-07-20 DIAGNOSIS — H401123 Primary open-angle glaucoma, left eye, severe stage: Secondary | ICD-10-CM | POA: Diagnosis not present

## 2021-08-07 ENCOUNTER — Encounter: Payer: Self-pay | Admitting: Oncology

## 2021-08-14 ENCOUNTER — Encounter: Payer: BC Managed Care – PPO | Admitting: Physician Assistant

## 2021-08-31 DIAGNOSIS — M25671 Stiffness of right ankle, not elsewhere classified: Secondary | ICD-10-CM | POA: Diagnosis not present

## 2021-08-31 DIAGNOSIS — R2689 Other abnormalities of gait and mobility: Secondary | ICD-10-CM | POA: Diagnosis not present

## 2021-09-11 ENCOUNTER — Telehealth: Payer: Self-pay

## 2021-09-11 NOTE — Telephone Encounter (Signed)
Left vm to confirm 09/14/21 appointment-Toni ?

## 2021-09-13 ENCOUNTER — Encounter: Payer: Self-pay | Admitting: Oncology

## 2021-09-14 ENCOUNTER — Encounter: Payer: BC Managed Care – PPO | Admitting: Physician Assistant

## 2021-09-14 DIAGNOSIS — M19071 Primary osteoarthritis, right ankle and foot: Secondary | ICD-10-CM | POA: Diagnosis not present

## 2021-09-14 DIAGNOSIS — M25671 Stiffness of right ankle, not elsewhere classified: Secondary | ICD-10-CM | POA: Diagnosis not present

## 2021-09-20 ENCOUNTER — Encounter: Payer: Self-pay | Admitting: Oncology

## 2021-09-20 ENCOUNTER — Inpatient Hospital Stay: Payer: BC Managed Care – PPO | Attending: Oncology

## 2021-09-20 DIAGNOSIS — Z8546 Personal history of malignant neoplasm of prostate: Secondary | ICD-10-CM | POA: Diagnosis not present

## 2021-09-20 DIAGNOSIS — D693 Immune thrombocytopenic purpura: Secondary | ICD-10-CM | POA: Diagnosis not present

## 2021-09-20 DIAGNOSIS — M25671 Stiffness of right ankle, not elsewhere classified: Secondary | ICD-10-CM | POA: Diagnosis not present

## 2021-09-20 DIAGNOSIS — Z803 Family history of malignant neoplasm of breast: Secondary | ICD-10-CM | POA: Insufficient documentation

## 2021-09-20 DIAGNOSIS — Z79899 Other long term (current) drug therapy: Secondary | ICD-10-CM | POA: Insufficient documentation

## 2021-09-20 DIAGNOSIS — E538 Deficiency of other specified B group vitamins: Secondary | ICD-10-CM | POA: Diagnosis not present

## 2021-09-20 DIAGNOSIS — Z87891 Personal history of nicotine dependence: Secondary | ICD-10-CM | POA: Insufficient documentation

## 2021-09-20 DIAGNOSIS — C61 Malignant neoplasm of prostate: Secondary | ICD-10-CM

## 2021-09-20 DIAGNOSIS — R1319 Other dysphagia: Secondary | ICD-10-CM | POA: Diagnosis not present

## 2021-09-20 DIAGNOSIS — R2689 Other abnormalities of gait and mobility: Secondary | ICD-10-CM | POA: Diagnosis not present

## 2021-09-20 DIAGNOSIS — Z9079 Acquired absence of other genital organ(s): Secondary | ICD-10-CM | POA: Insufficient documentation

## 2021-09-20 LAB — CBC WITH DIFFERENTIAL/PLATELET
Abs Immature Granulocytes: 0.02 10*3/uL (ref 0.00–0.07)
Basophils Absolute: 0 10*3/uL (ref 0.0–0.1)
Basophils Relative: 0 %
Eosinophils Absolute: 0.3 10*3/uL (ref 0.0–0.5)
Eosinophils Relative: 5 %
HCT: 42.4 % (ref 39.0–52.0)
Hemoglobin: 14 g/dL (ref 13.0–17.0)
Immature Granulocytes: 0 %
Lymphocytes Relative: 26 %
Lymphs Abs: 1.7 10*3/uL (ref 0.7–4.0)
MCH: 28.9 pg (ref 26.0–34.0)
MCHC: 33 g/dL (ref 30.0–36.0)
MCV: 87.6 fL (ref 80.0–100.0)
Monocytes Absolute: 0.5 10*3/uL (ref 0.1–1.0)
Monocytes Relative: 7 %
Neutro Abs: 4.2 10*3/uL (ref 1.7–7.7)
Neutrophils Relative %: 62 %
Platelets: 108 10*3/uL — ABNORMAL LOW (ref 150–400)
RBC: 4.84 MIL/uL (ref 4.22–5.81)
RDW: 14.3 % (ref 11.5–15.5)
Smear Review: NORMAL
WBC: 6.7 10*3/uL (ref 4.0–10.5)
nRBC: 0 % (ref 0.0–0.2)

## 2021-09-20 LAB — COMPREHENSIVE METABOLIC PANEL
ALT: 20 U/L (ref 0–44)
AST: 19 U/L (ref 15–41)
Albumin: 4.2 g/dL (ref 3.5–5.0)
Alkaline Phosphatase: 98 U/L (ref 38–126)
Anion gap: 5 (ref 5–15)
BUN: 10 mg/dL (ref 6–20)
CO2: 27 mmol/L (ref 22–32)
Calcium: 9.3 mg/dL (ref 8.9–10.3)
Chloride: 104 mmol/L (ref 98–111)
Creatinine, Ser: 0.89 mg/dL (ref 0.61–1.24)
GFR, Estimated: >60 mL/min (ref >60)
Glucose, Bld: 140 mg/dL — ABNORMAL HIGH (ref 70–99)
Potassium: 3.6 mmol/L (ref 3.5–5.1)
Sodium: 136 mmol/L (ref 135–145)
Total Bilirubin: 0.7 mg/dL (ref 0.3–1.2)
Total Protein: 7.6 g/dL (ref 6.5–8.1)

## 2021-09-20 LAB — VITAMIN B12: Vitamin B-12: 425 pg/mL (ref 180–914)

## 2021-09-20 LAB — PSA: Prostatic Specific Antigen: <0.01 ng/mL (ref 0.00–4.00)

## 2021-09-21 ENCOUNTER — Telehealth: Payer: Self-pay | Admitting: *Deleted

## 2021-09-21 NOTE — Telephone Encounter (Signed)
Patient called today and left a message.  Patient wanted to know what labs he had done yesterday.  I called him back and he had a CBC with differential, met c. , PSA, and a B12 level.  Patient said he only needed to know about the PSA because he had another doctor's office that wanted to go ahead and do psa but he does not need to repeat since he had that done yesterday ?

## 2021-09-22 ENCOUNTER — Inpatient Hospital Stay (HOSPITAL_BASED_OUTPATIENT_CLINIC_OR_DEPARTMENT_OTHER): Payer: BC Managed Care – PPO | Admitting: Oncology

## 2021-09-22 ENCOUNTER — Encounter: Payer: Self-pay | Admitting: Oncology

## 2021-09-22 VITALS — BP 113/79 | HR 62 | Temp 97.5°F | Resp 18 | Ht 67.0 in | Wt 162.1 lb

## 2021-09-22 DIAGNOSIS — R1319 Other dysphagia: Secondary | ICD-10-CM | POA: Diagnosis not present

## 2021-09-22 DIAGNOSIS — Z87891 Personal history of nicotine dependence: Secondary | ICD-10-CM | POA: Diagnosis not present

## 2021-09-22 DIAGNOSIS — D696 Thrombocytopenia, unspecified: Secondary | ICD-10-CM | POA: Diagnosis not present

## 2021-09-22 DIAGNOSIS — C61 Malignant neoplasm of prostate: Secondary | ICD-10-CM

## 2021-09-22 DIAGNOSIS — Z79899 Other long term (current) drug therapy: Secondary | ICD-10-CM | POA: Diagnosis not present

## 2021-09-22 DIAGNOSIS — Z8546 Personal history of malignant neoplasm of prostate: Secondary | ICD-10-CM | POA: Diagnosis not present

## 2021-09-22 DIAGNOSIS — Z9079 Acquired absence of other genital organ(s): Secondary | ICD-10-CM | POA: Diagnosis not present

## 2021-09-22 DIAGNOSIS — Z803 Family history of malignant neoplasm of breast: Secondary | ICD-10-CM | POA: Diagnosis not present

## 2021-09-22 DIAGNOSIS — D693 Immune thrombocytopenic purpura: Secondary | ICD-10-CM | POA: Diagnosis not present

## 2021-09-22 DIAGNOSIS — E538 Deficiency of other specified B group vitamins: Secondary | ICD-10-CM | POA: Diagnosis not present

## 2021-09-22 NOTE — Progress Notes (Signed)
Pt c/o pain and swelling in right foot ongoing since foot surgery. Pt just went back to work on 5/1.  ?

## 2021-09-22 NOTE — Progress Notes (Signed)
?Hematology/Oncology Progress note ?[phone] [phone] ?  ? ? ? ?Patient Care Team: ?Jonetta Osgood, NP as PCP - General (Nurse Practitioner) ?Christie Nottingham, PA as Referring Physician (Physician Assistant) ?Christene Lye, MD (General Surgery) ?Earlie Server, MD as Consulting Physician (Oncology) ? ?PURPOSE OF CONSULTATION:  ?Follow up of management of thrombocytopenia. ? ?HISTORY OF PRESENTING ILLNESS:  ?Donald Mendoza is a  61 y.o.  male with PMH listed below who was referred to me for evaluation of thrombocytopenia. Patient was recently diagnosed with intermediate risk prostate cancer in the follows up with Dr. Erlene Quan. Initially he was diagnosed with a low-risk prostate cancer Gleason score 3+3 in March 2018 when his PSA was 4. PSA continued to rise to 5.2 and patient had a repeat prostate biopsy revealing Gleason score 4+3. Patient was scheduled for radical prostatectomy however was found to have thrombocytopenia. Referred to [place] to evaluation for the low platelet count. Patient reports that he has been told that his platelet has been low in the past. He never received any treatment. ? ?# S/p RALP with BPLND 05/27/2017, pathology showed Gleason 4+3, pT2 N0, NEGATIVE FOR EXTRAPROSTATIC EXTENSION, SEMINAL VESICLE INVASION, AND BLADDER NECK INVASION. - MARGINS NEGATIVE FOR CARCINOMA.  ?# Prostate cancer pT2N0, PSA <0.1, no adverse features, , no adjuvant treatment was offered. ? ?# 11/15/2017 bone marrow biopsy showed normocellular bone marrow for age with trilineage hematopoiesis.  Bone marrow showed megakaryocytes with predominantly normal morphology.  Suggesting peripheral destruction, sequestration or consumption of platelets. ?Bone marrow cytogenetics were normal. ?Discussed with patient about his bone marrow biopsy results.  His chronic thrombocytopenia most likely secondary to ITP, possibly due to underlying autoimmune process.   ? ?INTERVAL HISTORY ?61 y.o. male with history  of prostate cancer s/p prostectomy, ITP presents for follow-up. ? ?+ dysphagia symptoms are worse. He has not established care. + heart burn ?No bleeding events.  ?Recent foot surgery, + pain and focal swelling.  ? ? ?Review of Systems  ?Constitutional:  Negative for chills, fever, malaise/fatigue and weight loss.  ?HENT:  Negative for sore throat.   ?Eyes:  Negative for redness.  ?Respiratory:  Negative for cough, shortness of breath and wheezing.   ?Cardiovascular:  Negative for chest pain, palpitations and leg swelling.  ?Gastrointestinal:  Negative for abdominal pain, blood in stool, nausea and vomiting.  ?     Dysphagia  ?Genitourinary:  Negative for dysuria.  ?Musculoskeletal:  Negative for myalgias.  ?Skin:  Negative for rash.  ?Neurological:  Negative for dizziness, tingling and tremors.  ?Endo/Heme/Allergies:  Does not bruise/bleed easily.  ?Psychiatric/Behavioral:  Negative for hallucinations.   ? ?MEDICAL HISTORY:  ?Past Medical History:  ?Diagnosis Date  ? B12 deficiency 04/21/2017  ? Cancer [facility]) 2018  ? Prostate Cancer  ? Diverticulosis   ? GERD (gastroesophageal reflux disease)   ? Glaucoma   ? Hypercholesteremia   ? ? ?SURGICAL HISTORY: ?Past Surgical History:  ?Procedure Laterality Date  ? COLONOSCOPY    ? HERNIA REPAIR    ? 10 years ago, [facility]. umbilical hernia x 2  ? PELVIC LYMPH NODE DISSECTION Bilateral 05/27/2017  ? Procedure: PELVIC LYMPH NODE DISSECTION;  Surgeon: Hollice Espy, MD;  Location: [facility] ORS;  Service: Urology;  Laterality: Bilateral;  ? ROBOT ASSISTED LAPAROSCOPIC RADICAL PROSTATECTOMY N/A 05/27/2017  ? Procedure: ROBOTIC ASSISTED LAPAROSCOPIC RADICAL PROSTATECTOMY;  Surgeon: Hollice Espy, MD;  Location: [facility] ORS;  Service: Urology;  Laterality: N/A;  ? ? ?SOCIAL HISTORY: ?Social History  ? ?Socioeconomic History  ?  Marital status: Single  ?  Spouse name: Not on file  ? Number of children: Not on file  ? Years of education: Not on file  ? Highest education level: Not on file   ?Occupational History  ? Not on file  ?Tobacco Use  ? Smoking status: Former  ?  Packs/day: 0.75  ?  Years: 34.00  ?  Pack years: 25.50  ?  Types: Cigarettes  ?  Quit date: 06/15/2018  ?  Years since quitting: 3.2  ? Smokeless tobacco: Never  ?Vaping Use  ? Vaping Use: Never used  ?Substance and Sexual Activity  ? Alcohol use: Yes  ?  Comment: socialy   ? Drug use: No  ? Sexual activity: Yes  ?Other Topics Concern  ? Not on file  ?Social History Narrative  ? Not on file  ? ?Social Determinants of Health  ? ?Financial Resource Strain: Not on file  ?Food Insecurity: Not on file  ?Transportation Needs: Not on file  ?Physical Activity: Not on file  ?Stress: Not on file  ?Social Connections: Not on file  ?Intimate Partner Violence: Not on file  ? ? ?FAMILY HISTORY: ?Family History  ?Problem Relation Age of Onset  ? Diabetes Mother   ? Breast cancer Mother   ? Arthritis Father   ? Breast cancer Sister   ? ? ?ALLERGIES:  is allergic to sulfa antibiotics. ? ?MEDICATIONS:  ?Current Outpatient Medications  ?Medication Sig Dispense Refill  ? latanoprost (XALATAN) 0.005 % ophthalmic solution Place 1 drop into both eyes at bedtime. 2.5 mL 2  ? pravastatin (PRAVACHOL) 40 MG tablet TAKE 1 TABLET(40 MG) BY MOUTH DAILY 30 tablet 5  ? vitamin B-12 (CYANOCOBALAMIN) 1000 MCG tablet Take 1 tablet (1,000 mcg total) by mouth daily. 90 tablet 1  ? meloxicam (MOBIC) 15 MG tablet Take 15 mg by mouth daily. (Patient not taking: Reported on 09/22/2021)    ? sildenafil (REVATIO) 20 MG tablet Take 1 tablet (20 mg total) by mouth 3 (three) times daily. (Patient not taking: Reported on 09/22/2021) 60 tablet 3  ? ?No current facility-administered medications for this visit.  ? ? ? ?PHYSICAL EXAMINATION: ?ECOG PERFORMANCE STATUS: 0 - Asymptomatic ?Vitals:  ? 09/22/21 1204  ?BP: 113/79  ?Pulse: 62  ?Resp: 18  ?Temp: (!) 97.5 ?F (36.4 ?C)  ?SpO2: 99%  ? ?Filed Weights  ? 09/22/21 1204  ?Weight: 162 lb 1.6 oz (73.5 kg)  ? ? ?Physical Exam ?Constitutional:    ?   General: He is not in acute distress. ?   Appearance: He is not diaphoretic.  ?HENT:  ?   Head: Normocephalic and atraumatic.  ?   Mouth/Throat:  ?   Pharynx: No oropharyngeal exudate.  ?Eyes:  ?   General: No scleral icterus. ?   Pupils: Pupils are equal, round, and reactive to light.  ?Cardiovascular:  ?   Rate and Rhythm: Normal rate and regular rhythm.  ?   Heart sounds: No murmur heard. ?Pulmonary:  ?   Effort: Pulmonary effort is normal. No respiratory distress.  ?   Breath sounds: No rales.  ?Chest:  ?   Chest wall: No tenderness.  ?Abdominal:  ?   General: There is no distension.  ?   Palpations: Abdomen is soft.  ?   Tenderness: There is no abdominal tenderness.  ?Musculoskeletal:     ?   General: Normal range of motion.  ?   Cervical back: Normal range of motion and neck supple.  ?Skin: ?  General: Skin is warm and dry.  ?   Findings: No erythema.  ?Neurological:  ?   Mental Status: He is alert and oriented to person, place, and time.  ?   Cranial Nerves: No cranial nerve deficit.  ?   Motor: No abnormal muscle tone.  ?   Coordination: Coordination normal.  ?Psychiatric:     ?   Mood and Affect: Affect normal.  ? ? ? ?LABORATORY DATA:  ?I have reviewed the data as listed ?Lab Results  ?Component Value Date  ? WBC 6.7 09/20/2021  ? HGB 14.0 09/20/2021  ? HCT 42.4 09/20/2021  ? MCV 87.6 09/20/2021  ? PLT 108 (L) 09/20/2021  ? ?Recent Labs  ?  12/28/20 ?1101 03/03/21 ?1419 09/20/21 ?1123  ?NA 139 137 136  ?K 4.0 3.8 3.6  ?CL 103 102 104  ?CO2 _0 ?GLUCOSE 93 142* 140*  ?BUN _1 ?CREATININE 0.77 0.86 0.89  ?CALCIUM 9.2 9.0 9.3  ?GFRNONAA  --  >60 >60  ?PROT 6.6 7.0 7.6  ?ALBUMIN 4.3 4.1 4.2  ?AST _2 ?ALT _3 ?ALKPHOS 108 111 98  ?BILITOT 0.3 0.3 0.7  ? ? ? ? ? ?ASSESSMENT & PLAN:  ?1. Thrombocytopenia ([place])   ?2. Prostate cancer ([place])   ?3. Personal history of tobacco use, presenting hazards to health   ?4. Other dysphagia   ?  ?# Chronic thrombocytopenia:  ITP,  ?Labs are  reviewed and discussed with patient. ?Continue observation  ? ?# Prostate Cancer pT2(m) pN0 PSA 5.2 gleason score 4+3, grade group 3--Stage IIC, unfavorable intermediate risk group. status post prostatectomy BPL

## 2021-10-04 DIAGNOSIS — R2689 Other abnormalities of gait and mobility: Secondary | ICD-10-CM | POA: Diagnosis not present

## 2021-10-04 DIAGNOSIS — M25671 Stiffness of right ankle, not elsewhere classified: Secondary | ICD-10-CM | POA: Diagnosis not present

## 2021-10-12 ENCOUNTER — Encounter: Payer: Self-pay | Admitting: Nurse Practitioner

## 2021-10-12 ENCOUNTER — Encounter: Payer: Self-pay | Admitting: Oncology

## 2021-10-12 ENCOUNTER — Ambulatory Visit (INDEPENDENT_AMBULATORY_CARE_PROVIDER_SITE_OTHER): Payer: BC Managed Care – PPO | Admitting: Nurse Practitioner

## 2021-10-12 VITALS — BP 130/88 | HR 95 | Temp 98.9°F | Resp 16 | Ht 67.0 in | Wt 162.0 lb

## 2021-10-12 DIAGNOSIS — H4010X0 Unspecified open-angle glaucoma, stage unspecified: Secondary | ICD-10-CM | POA: Diagnosis not present

## 2021-10-12 DIAGNOSIS — R1314 Dysphagia, pharyngoesophageal phase: Secondary | ICD-10-CM | POA: Diagnosis not present

## 2021-10-12 DIAGNOSIS — R7303 Prediabetes: Secondary | ICD-10-CM | POA: Diagnosis not present

## 2021-10-12 DIAGNOSIS — I700 Atherosclerosis of aorta: Secondary | ICD-10-CM

## 2021-10-12 DIAGNOSIS — R300 Dysuria: Secondary | ICD-10-CM

## 2021-10-12 DIAGNOSIS — E559 Vitamin D deficiency, unspecified: Secondary | ICD-10-CM

## 2021-10-12 DIAGNOSIS — Z0001 Encounter for general adult medical examination with abnormal findings: Secondary | ICD-10-CM | POA: Diagnosis not present

## 2021-10-12 DIAGNOSIS — E782 Mixed hyperlipidemia: Secondary | ICD-10-CM

## 2021-10-12 MED ORDER — LATANOPROST 0.005 % OP SOLN: 1.0000 [drp] | Freq: Every day | OPHTHALMIC | 2 refills | Status: DC

## 2021-10-12 MED ORDER — PRAVASTATIN SODIUM 40 MG PO TABS: ORAL_TABLET | ORAL | 5 refills | Status: DC

## 2021-10-12 NOTE — Progress Notes (Signed)
[facility] [place], [place] [address]  Internal MEDICINE  Office Visit Note  Patient Name: Donald Mendoza  [id number]  [id number]  Date of Service: 10/12/2021  Chief Complaint  Patient presents with   Annual Exam   Cough    Unable to ear because it gets so bad, difficulty swallowing, feels like food gets stuck, has had cough for at least 3 months   Sinusitis    HPI Donald Mendoza presents for an annual well visit and physical exam.  He is a well-appearing 61 year old male with long OSA, GERD, diabetes, thrombocytopenia, vitamin D deficiency and history of prostate cancer.  His last A1c was 6.1 in March, he is due to have his A1c level repeated in June.  His blood pressure and other vital signs are stable and within normal limits.  He had a routine colonoscopy for colorectal cancer screening in 2015 and he will be due to repeat that in 2025.  He has a few routine labs that he is due to have repeated.  He also follows up with a specialist for his history of prostate cancer and elevated PSA.  He does have a few medications he needs refills of. His only concern today is a cough with difficulty swallowing where the food gets stuck in his throat and this has been going on for few months now and it makes it hard to hear sometimes.  He has had testing done his esophagus before and he does have a kind of dysphagia and he was referred to GI last year but he never received a call to schedule per patient report so the referral ended up expiring but he would like to have a new referral so he can be seen and further evaluated.       Current Medication: Outpatient Encounter Medications as of 10/12/2021  Medication Sig   vitamin B-12 (CYANOCOBALAMIN) 1000 MCG tablet Take 1 tablet (1,000 mcg total) by mouth daily.   [DISCONTINUED] latanoprost (XALATAN) 0.005 % ophthalmic solution Place 1 drop into both eyes at bedtime.   [DISCONTINUED] pravastatin (PRAVACHOL) 40 MG tablet TAKE 1  TABLET(40 MG) BY MOUTH DAILY   latanoprost (XALATAN) 0.005 % ophthalmic solution Place 1 drop into both eyes at bedtime.   pravastatin (PRAVACHOL) 40 MG tablet TAKE 1 TABLET(40 MG) BY MOUTH DAILY   [DISCONTINUED] meloxicam (MOBIC) 15 MG tablet Take 15 mg by mouth daily. (Patient not taking: Reported on 09/22/2021)   [DISCONTINUED] sildenafil (REVATIO) 20 MG tablet Take 1 tablet (20 mg total) by mouth 3 (three) times daily. (Patient not taking: Reported on 09/22/2021)   No facility-administered encounter medications on file as of 10/12/2021.    Surgical History: Past Surgical History:  Procedure Laterality Date   COLONOSCOPY     FOOT SURGERY Right 05/24/2021   HERNIA REPAIR     10 years ago, [facility]. umbilical hernia x 2   PELVIC LYMPH NODE DISSECTION Bilateral 05/27/2017   Procedure: PELVIC LYMPH NODE DISSECTION;  Surgeon: Hollice Espy, MD;  Location: [facility] ORS;  Service: Urology;  Laterality: Bilateral;   ROBOT ASSISTED LAPAROSCOPIC RADICAL PROSTATECTOMY N/A 05/27/2017   Procedure: ROBOTIC ASSISTED LAPAROSCOPIC RADICAL PROSTATECTOMY;  Surgeon: Hollice Espy, MD;  Location: [facility] ORS;  Service: Urology;  Laterality: N/A;    Medical History: Past Medical History:  Diagnosis Date   B12 deficiency 04/21/2017   Cancer ([place]) 2018   Prostate Cancer   Diverticulosis    GERD (gastroesophageal reflux disease)    Glaucoma    Hypercholesteremia  Family History: Family History  Problem Relation Age of Onset   Diabetes Mother    Breast cancer Mother    Arthritis Father    Breast cancer Sister     Social History   Socioeconomic History   Marital status: Single    Spouse name: Not on file   Number of children: Not on file   Years of education: Not on file   Highest education level: Not on file  Occupational History   Not on file  Tobacco Use   Smoking status: Former    Packs/day: 0.75    Years: 34.00    Pack years: 25.50    Types: Cigarettes    Quit date: 06/15/2018    Years  since quitting: 3.3   Smokeless tobacco: Never  Vaping Use   Vaping Use: Never used  Substance and Sexual Activity   Alcohol use: Yes    Comment: socialy    Drug use: No   Sexual activity: Yes  Other Topics Concern   Not on file  Social History Narrative   Not on file   Social Determinants of Health   Financial Resource Strain: Not on file  Food Insecurity: Not on file  Transportation Needs: Not on file  Physical Activity: Not on file  Stress: Not on file  Social Connections: Not on file  Intimate Partner Violence: Not on file      Review of Systems  Constitutional:  Negative for activity change, appetite change, chills, fatigue, fever and unexpected weight change.  HENT: Negative.  Negative for congestion, ear pain, rhinorrhea, sore throat and trouble swallowing.   Eyes: Negative.   Respiratory:  Positive for cough. Negative for chest tightness, shortness of breath and wheezing.   Cardiovascular: Negative.  Negative for chest pain.  Gastrointestinal: Negative.  Negative for abdominal pain, blood in stool, constipation, diarrhea, nausea and vomiting.  Endocrine: Negative.   Genitourinary: Negative.  Negative for difficulty urinating, dysuria, frequency, hematuria and urgency.  Musculoskeletal: Negative.  Negative for arthralgias, back pain, joint swelling, myalgias and neck pain.  Skin: Negative.  Negative for rash and wound.  Allergic/Immunologic: Negative.  Negative for immunocompromised state.  Neurological: Negative.  Negative for dizziness, seizures, numbness and headaches.  Hematological: Negative.   Psychiatric/Behavioral: Negative.  Negative for behavioral problems, self-injury and suicidal ideas. The patient is not nervous/anxious.    Vital Signs: BP 130/88   Pulse 95   Temp 98.9 F (37.2 C)   Resp 16   Ht '5\' 7"'$  (1.702 m)   Wt 162 lb (73.5 kg)   SpO2 99%   BMI 25.37 kg/m    Physical Exam Vitals reviewed.  Constitutional:      General: He is awake.  He is not in acute distress.    Appearance: Normal appearance. He is well-developed and normal weight. He is not ill-appearing or diaphoretic.  HENT:     Head: Normocephalic and atraumatic.     Right Ear: Tympanic membrane, ear canal and external ear normal.     Left Ear: Tympanic membrane, ear canal and external ear normal.     Nose: Nose normal. No congestion or rhinorrhea.     Mouth/Throat:     Lips: Pink.     Mouth: Mucous membranes are moist.     Pharynx: Oropharynx is clear. Uvula midline. No oropharyngeal exudate or posterior oropharyngeal erythema.  Eyes:     General: Lids are normal. Vision grossly intact. Gaze aligned appropriately. No scleral icterus.  Right eye: No discharge.        Left eye: No discharge.     Extraocular Movements: Extraocular movements intact.     Conjunctiva/sclera: Conjunctivae normal.     Pupils: Pupils are equal, round, and reactive to light.     Funduscopic exam:    Right eye: Red reflex present.        Left eye: Red reflex present. Neck:     Thyroid: No thyromegaly.     Vascular: No JVD.     Trachea: Trachea and phonation normal. No tracheal deviation.  Cardiovascular:     Rate and Rhythm: Normal rate and regular rhythm.     Heart sounds: Normal heart sounds, S1 normal and S2 normal. No murmur heard.    No friction rub. No gallop.  Pulmonary:     Effort: Pulmonary effort is normal. No accessory muscle usage or respiratory distress.     Breath sounds: Normal breath sounds and air entry. No stridor. No wheezing or rales.  Chest:     Chest wall: No tenderness.  Abdominal:     General: Bowel sounds are normal. There is no distension.     Palpations: Abdomen is soft. There is no mass.     Tenderness: There is no abdominal tenderness. There is no guarding or rebound.  Musculoskeletal:        General: No tenderness or deformity. Normal range of motion.     Cervical back: Normal range of motion and neck supple.     Right lower leg: No edema.      Left lower leg: No edema.  Lymphadenopathy:     Cervical: No cervical adenopathy.  Skin:    General: Skin is warm and dry.     Coloration: Skin is not pale.     Findings: No erythema or rash.  Neurological:     Mental Status: He is alert.     Cranial Nerves: No cranial nerve deficit.     Motor: No abnormal muscle tone.     Coordination: Coordination normal.     Deep Tendon Reflexes: Reflexes are normal and symmetric.  Psychiatric:        Behavior: Behavior normal. Behavior is cooperative.        Thought Content: Thought content normal.        Judgment: Judgment normal.        Assessment/Plan: 1. Encounter for routine adult health examination with abnormal findings Age-appropriate preventive screenings and vaccinations discussed, annual physical exam completed. Routine labs for health maintenance ordered, see below. PHM updated.   2. Pharyngoesophageal dysphagia Referred to gastroenterology - Ambulatory referral to Gastroenterology  3. Prediabetes Repeat A1c ordered - Hgb A1C w/o eAG  4. Aortic atherosclerosis (HCC) Stable, refills ordered - pravastatin (PRAVACHOL) 40 MG tablet; TAKE 1 TABLET(40 MG) BY MOUTH DAILY  Dispense: 30 tablet; Refill: 5  5. Open-angle glaucoma, unspecified glaucoma stage, unspecified laterality, unspecified open-angle glaucoma type Refills ordered - latanoprost (XALATAN) 0.005 % ophthalmic solution; Place 1 drop into both eyes at bedtime.  Dispense: 2.5 mL; Refill: 2  6. Mixed hyperlipidemia Routine labs ordered - Lipid Profile  7. Vitamin D deficiency Routine lab ordered  - Vitamin D (25 hydroxy)  8. Dysuria Routine urinalysis done - UA/M w/rflx Culture, Routine      General Counseling: Warwick verbalizes understanding of the findings of todays visit and agrees with plan of treatment. I have discussed any further diagnostic evaluation that may be needed or ordered today. We also reviewed his  medications today. he has been  encouraged to call the office with any questions or concerns that should arise related to todays visit.    Orders Placed This Encounter  Procedures   UA/M w/rflx Culture, Routine   Lipid Profile   Hgb A1C w/o eAG   Vitamin D (25 hydroxy)   Ambulatory referral to Gastroenterology    Meds ordered this encounter  Medications   pravastatin (PRAVACHOL) 40 MG tablet    Sig: TAKE 1 TABLET(40 MG) BY MOUTH DAILY    Dispense:  30 tablet    Refill:  5   latanoprost (XALATAN) 0.005 % ophthalmic solution    Sig: Place 1 drop into both eyes at bedtime.    Dispense:  2.5 mL    Refill:  2    Return in about 3 months (around 01/12/2022) for F/U, Recheck A1C, Antonae Zbikowski PCP.   Total time spent:30 Minutes Time spent includes review of chart, medications, test results, and follow up plan with the patient.   [place] Controlled Substance Database was reviewed by me.  This patient was seen by Jonetta Osgood, FNP-C in collaboration with Dr. Clayborn Bigness as a part of collaborative care agreement.  Damare Serano R. Valetta Fuller, MSN, FNP-C Internal medicine

## 2021-10-13 LAB — UA/M W/RFLX CULTURE, ROUTINE
Bilirubin, UA: NEGATIVE
Glucose, UA: NEGATIVE
Leukocytes,UA: NEGATIVE
Nitrite, UA: NEGATIVE
RBC, UA: NEGATIVE
Specific Gravity, UA: 1.028 (ref 1.005–1.030)
Urobilinogen, Ur: 1 mg/dL (ref 0.2–1.0)
pH, UA: 6.5 (ref 5.0–7.5)

## 2021-10-13 LAB — MICROSCOPIC EXAMINATION
Bacteria, UA: NONE SEEN
Casts: NONE SEEN /lpf
Epithelial Cells (non renal): NONE SEEN /hpf (ref 0–10)
RBC, Urine: NONE SEEN /hpf (ref 0–2)
WBC, UA: NONE SEEN /hpf (ref 0–5)

## 2021-10-26 DIAGNOSIS — M19071 Primary osteoarthritis, right ankle and foot: Secondary | ICD-10-CM | POA: Diagnosis not present

## 2021-10-27 DIAGNOSIS — M25671 Stiffness of right ankle, not elsewhere classified: Secondary | ICD-10-CM | POA: Diagnosis not present

## 2021-10-27 DIAGNOSIS — R2689 Other abnormalities of gait and mobility: Secondary | ICD-10-CM | POA: Diagnosis not present

## 2021-11-15 DIAGNOSIS — R7303 Prediabetes: Secondary | ICD-10-CM | POA: Diagnosis not present

## 2021-11-15 DIAGNOSIS — E782 Mixed hyperlipidemia: Secondary | ICD-10-CM | POA: Diagnosis not present

## 2021-11-15 DIAGNOSIS — E559 Vitamin D deficiency, unspecified: Secondary | ICD-10-CM | POA: Diagnosis not present

## 2021-11-16 ENCOUNTER — Other Ambulatory Visit: Payer: Self-pay

## 2021-11-16 ENCOUNTER — Telehealth: Payer: Self-pay

## 2021-11-16 LAB — VITAMIN D 25 HYDROXY (VIT D DEFICIENCY, FRACTURES): Vit D, 25-Hydroxy: 14.7 ng/mL — ABNORMAL LOW (ref 30.0–100.0)

## 2021-11-16 LAB — LIPID PANEL
Chol/HDL Ratio: 3.4 ratio (ref 0.0–5.0)
Cholesterol, Total: 102 mg/dL (ref 100–199)
HDL: 30 mg/dL — ABNORMAL LOW (ref >39)
LDL Chol Calc (NIH): 58 mg/dL (ref 0–99)
Triglycerides: 65 mg/dL (ref 0–149)
VLDL Cholesterol Cal: 14 mg/dL (ref 5–40)

## 2021-11-16 LAB — HGB A1C W/O EAG: Hgb A1c MFr Bld: 6.8 % — ABNORMAL HIGH (ref 4.8–5.6)

## 2021-11-16 MED ORDER — ERGOCALCIFEROL 1.25 MG (50000 UT) PO CAPS: 50000.0000 [IU] | ORAL_CAPSULE | ORAL | 1 refills | Status: DC

## 2021-11-16 NOTE — Telephone Encounter (Signed)
-----   Message from Jonetta Osgood, NP sent at 11/16/2021  8:10 AM EDT ----- Please call the patient and let him know his results: --Cholesterol levels continue to improve and this helps to decrease the risk of cardiovascular disease.  The only abnormal cholesterol level is a low HDL of 30 but otherwise the rest of his cholesterol levels are within normal limits. --His A1c is elevated at 6.8 which is considered diabetic.  May need to schedule a follow-up visit to discuss diabetes, diet changes and lifestyle modifications as well as possibly discussing medications.  If he is agreeable, please transfer to Vivien Rota or Loma Sousa to schedule. --Vitamin D level is significantly low at 14.7, please send prescription for vitamin D 50,000 unit capsule, take 1 capsule by mouth once weekly, #14 caps, 1 refill

## 2021-11-16 NOTE — Telephone Encounter (Signed)
Spoke to pt, provided results, sent vitamin D to pharmacy, transferred to [facility] to schedule appt

## 2021-11-16 NOTE — Progress Notes (Signed)
Please call the patient and let him know his results: --Cholesterol levels continue to improve and this helps to decrease the risk of cardiovascular disease.  The only abnormal cholesterol level is a low HDL of 30 but otherwise the rest of his cholesterol levels are within normal limits. --His A1c is elevated at 6.8 which is considered diabetic.  May need to schedule a follow-up visit to discuss diabetes, diet changes and lifestyle modifications as well as possibly discussing medications.  If he is agreeable, please transfer to Vivien Rota or Loma Sousa to schedule. --Vitamin D level is significantly low at 14.7, please send prescription for vitamin D 50,000 unit capsule, take 1 capsule by mouth once weekly, #14 caps, 1 refill

## 2021-11-20 ENCOUNTER — Ambulatory Visit: Payer: BC Managed Care – PPO | Admitting: Nurse Practitioner

## 2021-11-20 ENCOUNTER — Encounter: Payer: Self-pay | Admitting: Nurse Practitioner

## 2021-11-20 VITALS — BP 115/78 | HR 94 | Temp 98.9°F | Resp 16 | Ht 67.0 in | Wt 155.8 lb

## 2021-11-20 DIAGNOSIS — R5383 Other fatigue: Secondary | ICD-10-CM

## 2021-11-20 DIAGNOSIS — E1165 Type 2 diabetes mellitus with hyperglycemia: Secondary | ICD-10-CM | POA: Diagnosis not present

## 2021-11-20 DIAGNOSIS — I700 Atherosclerosis of aorta: Secondary | ICD-10-CM | POA: Diagnosis not present

## 2021-11-20 DIAGNOSIS — E559 Vitamin D deficiency, unspecified: Secondary | ICD-10-CM | POA: Diagnosis not present

## 2021-11-20 NOTE — Progress Notes (Unsigned)
[facility] [place], [place] [address]  Internal MEDICINE  Office Visit Note  Patient Name: Donald Mendoza  [id number]  [id number]  Date of Service: 11/20/2021  Chief Complaint  Patient presents with   Follow-up    Lab results   Diabetes   Gastroesophageal Reflux   Hyperlipidemia    HPI Donald Mendoza presents for a follow-up visit for  Diet modifications Vitamin d    Current Medication: Outpatient Encounter Medications as of 11/20/2021  Medication Sig   ergocalciferol (VITAMIN D2) 1.25 MG (50000 UT) capsule Take 1 capsule (50,000 Units total) by mouth once a week.   latanoprost (XALATAN) 0.005 % ophthalmic solution Place 1 drop into both eyes at bedtime.   pravastatin (PRAVACHOL) 40 MG tablet TAKE 1 TABLET(40 MG) BY MOUTH DAILY   vitamin B-12 (CYANOCOBALAMIN) 1000 MCG tablet Take 1 tablet (1,000 mcg total) by mouth daily.   No facility-administered encounter medications on file as of 11/20/2021.    Surgical History: Past Surgical History:  Procedure Laterality Date   COLONOSCOPY     FOOT SURGERY Right 05/24/2021   HERNIA REPAIR     10 years ago, [facility]. umbilical hernia x 2   PELVIC LYMPH NODE DISSECTION Bilateral 05/27/2017   Procedure: PELVIC LYMPH NODE DISSECTION;  Surgeon: Hollice Espy, MD;  Location: [facility] ORS;  Service: Urology;  Laterality: Bilateral;   ROBOT ASSISTED LAPAROSCOPIC RADICAL PROSTATECTOMY N/A 05/27/2017   Procedure: ROBOTIC ASSISTED LAPAROSCOPIC RADICAL PROSTATECTOMY;  Surgeon: Hollice Espy, MD;  Location: [facility] ORS;  Service: Urology;  Laterality: N/A;    Medical History: Past Medical History:  Diagnosis Date   B12 deficiency 04/21/2017   Cancer ([place]) 2018   Prostate Cancer   Diverticulosis    GERD (gastroesophageal reflux disease)    Glaucoma    Hypercholesteremia     Family History: Family History  Problem Relation Age of Onset   Diabetes Mother    Breast cancer Mother    Arthritis Father    Breast cancer  Sister     Social History   Socioeconomic History   Marital status: Single    Spouse name: Not on file   Number of children: Not on file   Years of education: Not on file   Highest education level: Not on file  Occupational History   Not on file  Tobacco Use   Smoking status: Former    Packs/day: 0.75    Years: 34.00    Total pack years: 25.50    Types: Cigarettes    Quit date: 06/15/2018    Years since quitting: 3.4   Smokeless tobacco: Never  Vaping Use   Vaping Use: Never used  Substance and Sexual Activity   Alcohol use: Yes    Comment: socialy    Drug use: No   Sexual activity: Yes  Other Topics Concern   Not on file  Social History Narrative   Not on file   Social Determinants of Health   Financial Resource Strain: Not on file  Food Insecurity: Not on file  Transportation Needs: Not on file  Physical Activity: Not on file  Stress: Not on file  Social Connections: Not on file  Intimate Partner Violence: Not on file      Review of Systems  Vital Signs: BP 115/78   Pulse 94   Temp 98.9 F (37.2 C)   Resp 16   Ht '5\' 7"'$  (1.702 m)   Wt 155 lb 12.8 oz (70.7 kg)   SpO2 98%  BMI 24.40 kg/m    Physical Exam     Assessment/Plan:   General Counseling: Abass verbalizes understanding of the findings of todays visit and agrees with plan of treatment. I have discussed any further diagnostic evaluation that may be needed or ordered today. We also reviewed his medications today. he has been encouraged to call the office with any questions or concerns that should arise related to todays visit.    No orders of the defined types were placed in this encounter.   No orders of the defined types were placed in this encounter.   Return in about 3 months (around 02/20/2022) for F/U, Recheck A1C, Daulton Harbaugh PCP.   Total time spent:*** Minutes Time spent includes review of chart, medications, test results, and follow up plan with the patient.   [place]  Controlled Substance Database was reviewed by me.  This patient was seen by Jonetta Osgood, FNP-C in collaboration with Dr. Clayborn Bigness as a part of collaborative care agreement.   Amaziah Ghosh R. Valetta Fuller, MSN, FNP-C Internal medicine

## 2021-11-21 ENCOUNTER — Encounter: Payer: Self-pay | Admitting: Nurse Practitioner

## 2021-12-01 ENCOUNTER — Encounter: Payer: Self-pay | Admitting: Nurse Practitioner

## 2021-12-04 ENCOUNTER — Encounter: Payer: Self-pay | Admitting: Gastroenterology

## 2021-12-04 ENCOUNTER — Ambulatory Visit (INDEPENDENT_AMBULATORY_CARE_PROVIDER_SITE_OTHER): Payer: BC Managed Care – PPO | Admitting: Gastroenterology

## 2021-12-04 VITALS — BP 117/71 | HR 86 | Temp 98.1°F | Ht 67.0 in | Wt 154.0 lb

## 2021-12-04 DIAGNOSIS — Z1211 Encounter for screening for malignant neoplasm of colon: Secondary | ICD-10-CM | POA: Diagnosis not present

## 2021-12-04 DIAGNOSIS — R1319 Other dysphagia: Secondary | ICD-10-CM | POA: Diagnosis not present

## 2021-12-04 MED ORDER — OMEPRAZOLE 40 MG PO CPDR: 40.0000 mg | DELAYED_RELEASE_CAPSULE | Freq: Every day | ORAL | 1 refills | Status: DC

## 2021-12-04 MED ORDER — PEG 3350-KCL-NA BICARB-NACL 420 G PO SOLR: 4000.0000 mL | Freq: Once | ORAL | 0 refills | Status: AC

## 2021-12-04 NOTE — Progress Notes (Signed)
[name] MD, MRCP(U.K) [address]  [place]  [place], [place] [address]  Main: [phone]  Fax: [phone]   Gastroenterology Consultation  Referring Provider:     Jonetta Osgood, NP Primary Care Physician:  Jonetta Osgood, NP Primary Gastroenterologist:  Dr. [name]  Reason for Consultation:     Dysphagia        HPI:   Donald Mendoza is a 61 y.o. y/o male referred for consultation & management  by Jonetta Osgood, NP.     Referred to see me for dysphagia.  Ongoing for a few months.  Points to the center of his chest where he says the food gets stuck.  Affect solids more than liquids.  No unintentional weight loss.  No prior endoscopy.  Occasionally has heartburn.  Denies any allergies.  Cannot recall when her last colonoscopy but it was some years back when he had his prostate cancer diagnosis.  No family history of colon cancer or polyps.  No rectal bleeding or change in bowel habits.  Also due for colon cancer screening colonoscopy.  Hemoglobin 14 g in May 2023  Past Medical History:  Diagnosis Date   B12 deficiency 04/21/2017   Cancer ([place]) 2018   Prostate Cancer   Diverticulosis    GERD (gastroesophageal reflux disease)    Glaucoma    Hypercholesteremia     Past Surgical History:  Procedure Laterality Date   COLONOSCOPY     FOOT SURGERY Right 05/24/2021   HERNIA REPAIR     10 years ago, [facility]. umbilical hernia x 2   PELVIC LYMPH NODE DISSECTION Bilateral 05/27/2017   Procedure: PELVIC LYMPH NODE DISSECTION;  Surgeon: Hollice Espy, MD;  Location: [facility] ORS;  Service: Urology;  Laterality: Bilateral;   ROBOT ASSISTED LAPAROSCOPIC RADICAL PROSTATECTOMY N/A 05/27/2017   Procedure: ROBOTIC ASSISTED LAPAROSCOPIC RADICAL PROSTATECTOMY;  Surgeon: Hollice Espy, MD;  Location: [facility] ORS;  Service: Urology;  Laterality: N/A;    Prior to Admission medications   Medication Sig Start Date End Date Taking? Authorizing Provider  ergocalciferol  (VITAMIN D2) 1.25 MG (50000 UT) capsule Take 1 capsule (50,000 Units total) by mouth once a week. 11/16/21   Jonetta Osgood, NP  latanoprost (XALATAN) 0.005 % ophthalmic solution Place 1 drop into both eyes at bedtime. 10/12/21   Jonetta Osgood, NP  pravastatin (PRAVACHOL) 40 MG tablet TAKE 1 TABLET(40 MG) BY MOUTH DAILY 10/12/21   Jonetta Osgood, NP  vitamin B-12 (CYANOCOBALAMIN) 1000 MCG tablet Take 1 tablet (1,000 mcg total) by mouth daily. 05/29/21   Earlie Server, MD    Family History  Problem Relation Age of Onset   Diabetes Mother    Breast cancer Mother    Arthritis Father    Breast cancer Sister      Social History   Tobacco Use   Smoking status: Former    Packs/day: 0.75    Years: 34.00    Total pack years: 25.50    Types: Cigarettes    Quit date: 06/15/2018    Years since quitting: 3.4   Smokeless tobacco: Never  Vaping Use   Vaping Use: Never used  Substance Use Topics   Alcohol use: Yes    Comment: socialy    Drug use: No    Allergies as of 12/04/2021 - Review Complete 12/04/2021  Allergen Reaction Noted   Sulfa antibiotics Hives 04/15/2015    Review of Systems:    All systems reviewed and negative except where noted in HPI.  Physical Exam:  BP 117/71   Pulse 86   Temp 98.1 F (36.7 C) (Oral)   Ht '5\' 7"'$  (1.702 m)   Wt 154 lb (69.9 kg)   BMI 24.12 kg/m  No LMP for male patient. Psych:  Alert and cooperative. Normal mood and affect. General:   Alert,  Well-developed, well-nourished, pleasant and cooperative in NAD Head:  Normocephalic and atraumatic. Eyes:  Sclera clear, no icterus.   Conjunctiva pink. Neurologic:  Alert and oriented x3;  grossly normal neurologically. Psych:  Alert and cooperative. Normal mood and affect.  Imaging Studies: No results found.  Assessment and Plan:   Donald Mendoza is a 61 y.o. y/o male has been referred for dysphagia and colon cancer screening   Plan 1.  EGD and colonoscopy 2.  Commence on Prilosec 40 mg  to empirically treat any reflux associated dysphagia for 6 weeks   I have discussed alternative options, risks & benefits,  which include, but are not limited to, bleeding, infection, perforation,respiratory complication & drug reaction.  The patient agrees with this plan & written consent will be obtained.     Follow up  as needed Dr [name] MD,MRCP(U.K)

## 2021-12-04 NOTE — Addendum Note (Signed)
Addended by: Lurlean Nanny on: 12/04/2021 04:17 PM   Modules accepted: Orders

## 2021-12-04 NOTE — Addendum Note (Signed)
Addended by: Lurlean Nanny on: 12/04/2021 04:49 PM   Modules accepted: Orders

## 2021-12-11 ENCOUNTER — Other Ambulatory Visit: Payer: Self-pay | Admitting: *Deleted

## 2021-12-11 MED ORDER — VITAMIN B-12 1000 MCG PO TABS: 1000.0000 ug | ORAL_TABLET | Freq: Every day | ORAL | 1 refills | Status: DC

## 2021-12-12 ENCOUNTER — Telehealth: Payer: Self-pay

## 2021-12-12 NOTE — Addendum Note (Signed)
Addended by: Vanetta Mulders on: 12/12/2021 11:12 AM   Modules accepted: Orders

## 2021-12-12 NOTE — Telephone Encounter (Signed)
Patient lvm requesting to reschedule his procedures due to a death in the family.  He has been rescheduled to 01/16/22.  Magda Paganini in Endo has been notified of date change.  Thanks, [place], [place]

## 2022-01-12 ENCOUNTER — Telehealth: Payer: Self-pay | Admitting: Acute Care

## 2022-01-12 NOTE — Telephone Encounter (Signed)
Attempted to reach pt to schedule annual LDCT-LVMM

## 2022-01-16 ENCOUNTER — Ambulatory Visit: Payer: BC Managed Care – PPO | Admitting: Anesthesiology

## 2022-01-16 ENCOUNTER — Ambulatory Visit
Admission: RE | Admit: 2022-01-16 | Discharge: 2022-01-16 | Disposition: A | Discharge status: Home / Self Care | Payer: BC Managed Care – PPO | Attending: Gastroenterology | Admitting: Gastroenterology

## 2022-01-16 ENCOUNTER — Encounter
Admission: RE | Disposition: A | Discharge status: Home / Self Care | Payer: Self-pay | Source: Home / Self Care | Attending: Gastroenterology

## 2022-01-16 DIAGNOSIS — E7800 Pure hypercholesterolemia, unspecified: Secondary | ICD-10-CM | POA: Diagnosis not present

## 2022-01-16 DIAGNOSIS — R1319 Other dysphagia: Secondary | ICD-10-CM

## 2022-01-16 DIAGNOSIS — G4730 Sleep apnea, unspecified: Secondary | ICD-10-CM | POA: Insufficient documentation

## 2022-01-16 DIAGNOSIS — K219 Gastro-esophageal reflux disease without esophagitis: Secondary | ICD-10-CM | POA: Diagnosis not present

## 2022-01-16 DIAGNOSIS — R1310 Dysphagia, unspecified: Secondary | ICD-10-CM | POA: Diagnosis not present

## 2022-01-16 DIAGNOSIS — E119 Type 2 diabetes mellitus without complications: Secondary | ICD-10-CM | POA: Diagnosis not present

## 2022-01-16 DIAGNOSIS — K5730 Diverticulosis of large intestine without perforation or abscess without bleeding: Secondary | ICD-10-CM | POA: Insufficient documentation

## 2022-01-16 DIAGNOSIS — Z87891 Personal history of nicotine dependence: Secondary | ICD-10-CM | POA: Diagnosis not present

## 2022-01-16 DIAGNOSIS — K222 Esophageal obstruction: Secondary | ICD-10-CM | POA: Diagnosis not present

## 2022-01-16 DIAGNOSIS — Z8546 Personal history of malignant neoplasm of prostate: Secondary | ICD-10-CM | POA: Insufficient documentation

## 2022-01-16 DIAGNOSIS — Z1211 Encounter for screening for malignant neoplasm of colon: Secondary | ICD-10-CM

## 2022-01-16 DIAGNOSIS — R1314 Dysphagia, pharyngoesophageal phase: Secondary | ICD-10-CM

## 2022-01-16 DIAGNOSIS — G4733 Obstructive sleep apnea (adult) (pediatric): Secondary | ICD-10-CM | POA: Diagnosis not present

## 2022-01-16 HISTORY — PX: COLONOSCOPY WITH PROPOFOL: SHX5780

## 2022-01-16 HISTORY — PX: ESOPHAGOGASTRODUODENOSCOPY: SHX5428

## 2022-01-16 SURGERY — COLONOSCOPY WITH PROPOFOL
Anesthesia: General

## 2022-01-16 MED ORDER — GLYCOPYRROLATE 0.2 MG/ML IJ SOLN
INTRAMUSCULAR | Status: DC | PRN
  Administered 2022-01-16: .2 mg via INTRAVENOUS

## 2022-01-16 MED ORDER — PHENYLEPHRINE HCL (PRESSORS) 10 MG/ML IV SOLN
INTRAVENOUS | Status: DC | PRN
  Administered 2022-01-16: 80 ug via INTRAVENOUS

## 2022-01-16 MED ORDER — LIDOCAINE HCL (CARDIAC) PF 100 MG/5ML IV SOSY
PREFILLED_SYRINGE | INTRAVENOUS | Status: DC | PRN
  Administered 2022-01-16: 60 mg via INTRAVENOUS

## 2022-01-16 MED ORDER — PROPOFOL 500 MG/50ML IV EMUL
INTRAVENOUS | Status: DC | PRN
  Administered 2022-01-16: 140 ug/kg/min via INTRAVENOUS

## 2022-01-16 MED ORDER — SODIUM CHLORIDE 0.9 % IV SOLN
INTRAVENOUS | Status: DC
  Administered 2022-01-16: 20 mL/h via INTRAVENOUS

## 2022-01-16 MED ORDER — PROPOFOL 10 MG/ML IV BOLUS
INTRAVENOUS | Status: DC | PRN
  Administered 2022-01-16: 70 mg via INTRAVENOUS

## 2022-01-16 MED ORDER — DEXMEDETOMIDINE (PRECEDEX) IN NS 20 MCG/5ML (4 MCG/ML) IV SYRINGE
PREFILLED_SYRINGE | INTRAVENOUS | Status: DC | PRN
  Administered 2022-01-16: 8 ug via INTRAVENOUS

## 2022-01-16 NOTE — Anesthesia Preprocedure Evaluation (Signed)
Anesthesia Evaluation  Patient identified by MRN, date of birth, ID band Patient awake    Reviewed: Allergy & Precautions, NPO status , Patient's Chart, lab work & pertinent test results  History of Anesthesia Complications Negative for: history of anesthetic complications  Airway Mallampati: III  TM Distance: >3 FB Neck ROM: full    Dental  (+) Chipped, Poor Dentition, Missing   Pulmonary neg shortness of breath, sleep apnea , former smoker,    Pulmonary exam normal        Cardiovascular Exercise Tolerance: Good (-) angina(-) Past MI and (-) DOE Normal cardiovascular exam     Neuro/Psych negative neurological ROS  negative psych ROS   GI/Hepatic Neg liver ROS, GERD  Controlled,  Endo/Other  diabetes, Type 2  Renal/GU negative Renal ROS  negative genitourinary   Musculoskeletal   Abdominal   Peds  Hematology negative hematology ROS (+)   Anesthesia Other Findings Past Medical History: 04/21/2017: B12 deficiency 2018: Cancer ([place])     Comment:  Prostate Cancer No date: Diverticulosis No date: GERD (gastroesophageal reflux disease) No date: Glaucoma No date: Hypercholesteremia  Past Surgical History: No date: COLONOSCOPY 05/24/2021: FOOT SURGERY; Right No date: HERNIA REPAIR     Comment:  10 years ago, [place]. umbilical hernia x 2 85/46/2703: PELVIC LYMPH NODE DISSECTION; Bilateral     Comment:  Procedure: PELVIC LYMPH NODE DISSECTION;  Surgeon:               Hollice Espy, MD;  Location: [facility] ORS;  Service:               Urology;  Laterality: Bilateral; 05/27/2017: ROBOT ASSISTED LAPAROSCOPIC RADICAL PROSTATECTOMY; N/A     Comment:  Procedure: ROBOTIC ASSISTED LAPAROSCOPIC RADICAL               PROSTATECTOMY;  Surgeon: Hollice Espy, MD;  Location:               [facility] ORS;  Service: Urology;  Laterality: N/A;  BMI    Body Mass Index: 24.28 kg/m      Reproductive/Obstetrics negative OB ROS                              Anesthesia Physical Anesthesia Plan  ASA: 3  Anesthesia Plan: General   Post-op Pain Management:    Induction: Intravenous  PONV Risk Score and Plan: Propofol infusion and TIVA  Airway Management Planned: Natural Airway and Nasal Cannula  Additional Equipment:   Intra-op Plan:   Post-operative Plan:   Informed Consent: I have reviewed the patients History and Physical, chart, labs and discussed the procedure including the risks, benefits and alternatives for the proposed anesthesia with the patient or authorized representative who has indicated his/her understanding and acceptance.     Dental Advisory Given  Plan Discussed with: Anesthesiologist, CRNA and Surgeon  Anesthesia Plan Comments: (Patient consented for risks of anesthesia including but not limited to:  - adverse reactions to medications - risk of airway placement if required - damage to eyes, teeth, lips or other oral mucosa - nerve damage due to positioning  - sore throat or hoarseness - Damage to heart, brain, nerves, lungs, other parts of body or loss of life  Patient voiced understanding.)        Anesthesia Quick Evaluation

## 2022-01-16 NOTE — Op Note (Signed)
[facility] Gastroenterology Patient Name: Donald Mendoza Procedure Date: 01/16/2022 8:33 AM MRN: [id number] Account #: [id number] Date of Birth: 02/07/1961 Admit Type: Outpatient Age: 61 Room: [facility] ENDO ROOM 2 Gender: Male Note Status: Finalized Instrument Name: Upper Endoscope [id number] Procedure:             Upper GI endoscopy Indications:           Dysphagia Providers:             [name] MD, MD Referring MD:          Jonetta Osgood (Referring MD) Medicines:             Monitored Anesthesia Care Complications:         No immediate complications. Procedure:             Pre-Anesthesia Assessment:                        - Prior to the procedure, a History and Physical was                         performed, and patient medications, allergies and                         sensitivities were reviewed. The patient's tolerance                         of previous anesthesia was reviewed.                        - The risks and benefits of the procedure and the                         sedation options and risks were discussed with the                         patient. All questions were answered and informed                         consent was obtained.                        - ASA Grade Assessment: II - A patient with mild                         systemic disease.                        After obtaining informed consent, the endoscope was                         passed under direct vision. Throughout the procedure,                         the patient's blood pressure, pulse, and oxygen                         saturations were monitored continuously. The                         Endosonoscope was introduced through the  mouth, and                         advanced to the third part of duodenum. The upper GI                         endoscopy was accomplished with ease. The patient                         tolerated the procedure well. Findings:      One benign-appearing,  intrinsic moderate stenosis was found in the lower       third of the esophagus. This stenosis measured 1.5 cm (inner diameter).       The stenosis was traversed. A TTS dilator was passed through the scope.       Dilation with a 15-16.5-18 mm balloon dilator was performed to 18 mm.       The dilation site was examined and showed moderate mucosal disruption.      The cardia and gastric fundus were normal on retroflexion.      The stomach was normal.      The examined duodenum was normal. Impression:            - Benign-appearing esophageal stenosis. Dilated.                        - Normal stomach.                        - Normal examined duodenum.                        - No specimens collected. Recommendation:        - Perform a colonoscopy today. Procedure Code(s):     --- Professional ---                        [phone], Esophagogastroduodenoscopy, flexible,                         transoral; with transendoscopic balloon dilation of                         esophagus (less than 30 mm diameter) Diagnosis Code(s):     --- Professional ---                        K22.2, Esophageal obstruction                        R13.10, Dysphagia, unspecified CPT copyright 2019 American Medical Association. All rights reserved. The codes documented in this report are preliminary and upon coder review may  be revised to meet current compliance requirements. [name], MD [name] MD, MD 01/16/2022 8:50:20 AM This report has been signed electronically. Number of Addenda: 0 Note Initiated On: 01/16/2022 8:33 AM Estimated Blood Loss:  Estimated blood loss: none.      [facility]

## 2022-01-16 NOTE — Op Note (Signed)
[facility] Gastroenterology Patient Name: Donald Mendoza Procedure Date: 01/16/2022 8:32 AM MRN: [id number] Account #: [id number] Date of Birth: [birth year] Admit Type: Outpatient Age: 61 Room: [facility] ENDO ROOM 2 Gender: Male Note Status: Finalized Instrument Name: Jasper Riling [id number] Procedure:             Colonoscopy Indications:           Screening for colorectal malignant neoplasm Providers:             [name] MD, MD Referring MD:          Jonetta Osgood (Referring MD) Medicines:             Monitored Anesthesia Care Complications:         No immediate complications. Procedure:             Pre-Anesthesia Assessment:                        - Prior to the procedure, a History and Physical was                         performed, and patient medications, allergies and                         sensitivities were reviewed. The patient's tolerance                         of previous anesthesia was reviewed.                        - The risks and benefits of the procedure and the                         sedation options and risks were discussed with the                         patient. All questions were answered and informed                         consent was obtained.                        - ASA Grade Assessment: II - A patient with mild                         systemic disease.                        After obtaining informed consent, the colonoscope was                         passed under direct vision. Throughout the procedure,                         the patient's blood pressure, pulse, and oxygen                         saturations were monitored continuously. The                         Colonoscope was introduced through  the anus and                         advanced to the the cecum, identified by the                         appendiceal orifice. The colonoscopy was performed                         with ease. The patient tolerated the procedure well.                          The quality of the bowel preparation was excellent. Findings:      The perianal and digital rectal examinations were normal.      Multiple small-mouthed diverticula were found in the sigmoid colon.      The exam was otherwise without abnormality on direct and retroflexion       views. Impression:            - Diverticulosis in the sigmoid colon.                        - The examination was otherwise normal on direct and                         retroflexion views.                        - No specimens collected. Recommendation:        - Discharge patient to home (with escort).                        - Resume previous diet.                        - Continue present medications.                        - Repeat colonoscopy in 10 years for screening                         purposes. Procedure Code(s):     --- Professional ---                        [phone], Colonoscopy, flexible; diagnostic, including                         collection of specimen(s) by brushing or washing, when                         performed (separate procedure) Diagnosis Code(s):     --- Professional ---                        Z12.11, Encounter for screening for malignant neoplasm                         of colon                        K57.30, Diverticulosis of large intestine without  perforation or abscess without bleeding CPT copyright 2019 American Medical Association. All rights reserved. The codes documented in this report are preliminary and upon coder review may  be revised to meet current compliance requirements. [name], MD [name] MD, MD 01/16/2022 9:05:37 AM This report has been signed electronically. Number of Addenda: 0 Note Initiated On: 01/16/2022 8:32 AM Scope Withdrawal Time: 0 hours 6 minutes 39 seconds  Total Procedure Duration: 0 hours 10 minutes 13 seconds  Estimated Blood Loss:  Estimated blood loss: none.      [facility]

## 2022-01-16 NOTE — H&P (Signed)
Donald Bellows, MD [address] [place], [place], [place], [address] [address], [place], [place], [place], [address] Phone: [phone]  Fax: [phone]  Primary Care Physician:  Donald Osgood, NP   Pre-Procedure History & Physical: HPI:  Donald Mendoza is a 61 y.o. male is here for an endoscopy and colonoscopy    Past Medical History:  Diagnosis Date   B12 deficiency 04/21/2017   Cancer ([place]) 2018   Prostate Cancer   Diverticulosis    GERD (gastroesophageal reflux disease)    Glaucoma    Hypercholesteremia     Past Surgical History:  Procedure Laterality Date   COLONOSCOPY     FOOT SURGERY Right 05/24/2021   HERNIA REPAIR     10 years ago, [facility]. umbilical hernia x 2   PELVIC LYMPH NODE DISSECTION Bilateral 05/27/2017   Procedure: PELVIC LYMPH NODE DISSECTION;  Surgeon: Hollice Espy, MD;  Location: [facility] ORS;  Service: Urology;  Laterality: Bilateral;   ROBOT ASSISTED LAPAROSCOPIC RADICAL PROSTATECTOMY N/A 05/27/2017   Procedure: ROBOTIC ASSISTED LAPAROSCOPIC RADICAL PROSTATECTOMY;  Surgeon: Hollice Espy, MD;  Location: [facility] ORS;  Service: Urology;  Laterality: N/A;    Prior to Admission medications   Medication Sig Start Date End Date Taking? Authorizing Provider  ergocalciferol (VITAMIN D2) 1.25 MG (50000 UT) capsule Take 1 capsule (50,000 Units total) by mouth once a week. 11/16/21  Yes Donald Mendoza, Alyssa, NP  latanoprost (XALATAN) 0.005 % ophthalmic solution Place 1 drop into both eyes at bedtime. 10/12/21  Yes Donald Mendoza, Donald Flock, NP  omeprazole (PRILOSEC) 40 MG capsule Take 1 capsule (40 mg total) by mouth daily. 12/04/21  Yes Donald Bellows, MD  pravastatin (PRAVACHOL) 40 MG tablet TAKE 1 TABLET(40 MG) BY MOUTH DAILY 10/12/21  Yes Donald Mendoza, Alyssa, NP  vitamin B-12 (CYANOCOBALAMIN) 1000 MCG tablet Take 1 tablet (1,000 mcg total) by mouth daily. 12/11/21  Yes Donald Server, MD    Allergies as of 12/05/2021 - Review Complete 12/04/2021  Allergen Reaction  Noted   Sulfa antibiotics Hives 04/15/2015    Family History  Problem Relation Age of Onset   Diabetes Mother    Breast cancer Mother    Arthritis Father    Breast cancer Sister     Social History   Socioeconomic History   Marital status: Single    Spouse name: Not on file   Number of children: Not on file   Years of education: Not on file   Highest education level: Not on file  Occupational History   Not on file  Tobacco Use   Smoking status: Former    Packs/day: 0.75    Years: 34.00    Total pack years: 25.50    Types: Cigarettes    Quit date: 06/15/2018    Years since quitting: 3.5   Smokeless tobacco: Never  Vaping Use   Vaping Use: Never used  Substance and Sexual Activity   Alcohol use: Yes    Comment: socialy    Drug use: No   Sexual activity: Yes  Other Topics Concern   Not on file  Social History Narrative   Not on file   Social Determinants of Health   Financial Resource Strain: Not on file  Food Insecurity: Not on file  Transportation Needs: Not on file  Physical Activity: Not on file  Stress: Not on file  Social Connections: Not on file  Intimate Partner Violence: Not on file    Review of Systems: See HPI, otherwise negative ROS  Physical  Exam: BP 113/73   Pulse 63   Temp (!) 96.9 F (36.1 C) (Temporal)   Resp 20   Ht '5\' 7"'$  (1.702 m)   Wt 70.3 kg   SpO2 100%   BMI 24.28 kg/m  General:   Alert,  pleasant and cooperative in NAD Head:  Normocephalic and atraumatic. Neck:  Supple; no masses or thyromegaly. Lungs:  Clear throughout to auscultation, normal respiratory effort.    Heart:  +S1, +S2, Regular rate and rhythm, No edema. Abdomen:  Soft, nontender and nondistended. Normal bowel sounds, without guarding, and without rebound.   Neurologic:  Alert and  oriented x4;  grossly normal neurologically.  Impression/Plan: Donald Mendoza is here for an endoscopy and colonoscopy  to be performed for  evaluation of dysphagia and colon  cancer screening     Risks, benefits, limitations, and alternatives regarding endoscopy have been reviewed with the patient.  Questions have been answered.  All parties agreeable.   Donald Bellows, MD  01/16/2022, 8:28 AM

## 2022-01-16 NOTE — Anesthesia Postprocedure Evaluation (Signed)
Anesthesia Post Note  Patient: Donald Mendoza  Procedure(s) Performed: COLONOSCOPY WITH PROPOFOL ESOPHAGOGASTRODUODENOSCOPY (EGD)  Patient location during evaluation: Endoscopy Anesthesia Type: General Level of consciousness: awake and alert Pain management: pain level controlled Vital Signs Assessment: post-procedure vital signs reviewed and stable Respiratory status: spontaneous breathing, nonlabored ventilation, respiratory function stable and patient connected to nasal cannula oxygen Cardiovascular status: blood pressure returned to baseline and stable Postop Assessment: no apparent nausea or vomiting Anesthetic complications: no   No notable events documented.   Last Vitals:  Vitals:   01/16/22 0926 01/16/22 0936  BP: 106/76 117/89  Pulse: 83 82  Resp:  18  Temp:    SpO2: 100% 100%    Last Pain:  Vitals:   01/16/22 0936  TempSrc:   PainSc: 0-No pain                 Precious Haws Dior Dominik

## 2022-01-16 NOTE — Transfer of Care (Signed)
Immediate Anesthesia Transfer of Care Note  Patient: Donald Mendoza  Procedure(s) Performed: COLONOSCOPY WITH PROPOFOL ESOPHAGOGASTRODUODENOSCOPY (EGD)  Patient Location: PACU and Endoscopy Unit  Anesthesia Type:General  Level of Consciousness: drowsy  Airway & Oxygen Therapy: Patient Spontanous Breathing  Post-op Assessment: Report given to RN  Post vital signs: stable  Last Vitals:  Vitals Value Taken Time  BP 87/62 01/16/22 0906  Temp    Pulse 97 01/16/22 0906  Resp 15 01/16/22 0906  SpO2 100 % 01/16/22 0906  Vitals shown include unvalidated device data.  Last Pain:  Vitals:   01/16/22 0718  TempSrc: Temporal  PainSc: 0-No pain         Complications: No notable events documented.

## 2022-01-17 ENCOUNTER — Encounter: Payer: Self-pay | Admitting: Gastroenterology

## 2022-02-08 ENCOUNTER — Encounter: Payer: Self-pay | Admitting: Oncology

## 2022-02-15 ENCOUNTER — Other Ambulatory Visit: Payer: BC Managed Care – PPO

## 2022-02-15 DIAGNOSIS — H401123 Primary open-angle glaucoma, left eye, severe stage: Secondary | ICD-10-CM | POA: Diagnosis not present

## 2022-02-15 DIAGNOSIS — C61 Malignant neoplasm of prostate: Secondary | ICD-10-CM

## 2022-02-16 LAB — PSA: Prostate Specific Ag, Serum: <0.1 ng/mL (ref 0.0–4.0)

## 2022-02-19 ENCOUNTER — Encounter: Payer: Self-pay | Admitting: Nurse Practitioner

## 2022-02-19 ENCOUNTER — Ambulatory Visit: Payer: BC Managed Care – PPO | Admitting: Nurse Practitioner

## 2022-02-19 VITALS — BP 116/75 | HR 80 | Temp 98.3°F | Resp 16 | Ht 67.0 in | Wt 153.2 lb

## 2022-02-19 DIAGNOSIS — K219 Gastro-esophageal reflux disease without esophagitis: Secondary | ICD-10-CM | POA: Diagnosis not present

## 2022-02-19 DIAGNOSIS — E1165 Type 2 diabetes mellitus with hyperglycemia: Secondary | ICD-10-CM

## 2022-02-19 DIAGNOSIS — I700 Atherosclerosis of aorta: Secondary | ICD-10-CM

## 2022-02-19 LAB — POCT GLYCOSYLATED HEMOGLOBIN (HGB A1C): Hemoglobin A1C: 6 % — AB (ref 4.0–5.6)

## 2022-02-19 NOTE — Progress Notes (Signed)
[name] [place], [place] [address]  Internal MEDICINE  Office Visit Note  Patient Name: Donald Mendoza  [id number]  [id number]  Date of Service: 02/19/2022  Chief Complaint  Patient presents with   Follow-up   Hyperlipidemia   Gastroesophageal Reflux    HPI Donald Mendoza presents for a follow up visit for diabetes, GERD, and hyperlipidemia.  Diabetes -- A1C improved to 6.0. after his last visit, he has decreased his portion sizes, stopped making homemade biscuits, cut out sodas and sweets and started walking every day. He has a work friend who is diabetic and is helping and mentoring him. Has also started eating more salads.  GERD -- acid reflux controlled with current medication Hyperlipidemia -- takes pravastatin, last lipid panel was controlled.       Current Medication: Outpatient Encounter Medications as of 02/19/2022  Medication Sig   ergocalciferol (VITAMIN D2) 1.25 MG (50000 UT) capsule Take 1 capsule (50,000 Units total) by mouth once a week.   latanoprost (XALATAN) 0.005 % ophthalmic solution Place 1 drop into both eyes at bedtime.   omeprazole (PRILOSEC) 40 MG capsule Take 1 capsule (40 mg total) by mouth daily.   pravastatin (PRAVACHOL) 40 MG tablet TAKE 1 TABLET(40 MG) BY MOUTH DAILY   vitamin B-12 (CYANOCOBALAMIN) 1000 MCG tablet Take 1 tablet (1,000 mcg total) by mouth daily.   No facility-administered encounter medications on file as of 02/19/2022.    Surgical History: Past Surgical History:  Procedure Laterality Date   COLONOSCOPY     COLONOSCOPY WITH PROPOFOL N/A 01/16/2022   Procedure: COLONOSCOPY WITH PROPOFOL;  Surgeon: [name], MD;  Location: [facility] ENDOSCOPY;  Service: Gastroenterology;  Laterality: N/A;   ESOPHAGOGASTRODUODENOSCOPY N/A 01/16/2022   Procedure: ESOPHAGOGASTRODUODENOSCOPY (EGD);  Surgeon: [name], MD;  Location: [facility] ENDOSCOPY;  Service: Gastroenterology;  Laterality: N/A;   FOOT SURGERY Right 05/24/2021    HERNIA REPAIR     10 years ago, [facility]. umbilical hernia x 2   PELVIC LYMPH NODE DISSECTION Bilateral 05/27/2017   Procedure: PELVIC LYMPH NODE DISSECTION;  Surgeon: Hollice Espy, MD;  Location: [facility] ORS;  Service: Urology;  Laterality: Bilateral;   ROBOT ASSISTED LAPAROSCOPIC RADICAL PROSTATECTOMY N/A 05/27/2017   Procedure: ROBOTIC ASSISTED LAPAROSCOPIC RADICAL PROSTATECTOMY;  Surgeon: Hollice Espy, MD;  Location: [facility] ORS;  Service: Urology;  Laterality: N/A;    Medical History: Past Medical History:  Diagnosis Date   B12 deficiency 04/21/2017   Cancer ([place]) 2018   Prostate Cancer   Diverticulosis    GERD (gastroesophageal reflux disease)    Glaucoma    Hypercholesteremia     Family History: Family History  Problem Relation Age of Onset   Diabetes Mother    Breast cancer Mother    Arthritis Father    Breast cancer Sister     Social History   Socioeconomic History   Marital status: Single    Spouse name: Not on file   Number of children: Not on file   Years of education: Not on file   Highest education level: Not on file  Occupational History   Not on file  Tobacco Use   Smoking status: Former    Packs/day: 0.75    Years: 34.00    Total pack years: 25.50    Types: Cigarettes    Quit date: 06/15/2018    Years since quitting: 3.6   Smokeless tobacco: Never  Vaping Use   Vaping Use: Never used  Substance and Sexual Activity   Alcohol use:  Yes    Comment: socialy    Drug use: No   Sexual activity: Yes  Other Topics Concern   Not on file  Social History Narrative   Not on file   Social Determinants of Health   Financial Resource Strain: Not on file  Food Insecurity: Not on file  Transportation Needs: Not on file  Physical Activity: Not on file  Stress: Not on file  Social Connections: Not on file  Intimate Partner Violence: Not on file      Review of Systems  Constitutional:  Positive for fatigue. Negative for chills and unexpected weight  change.  HENT:  Negative for congestion, rhinorrhea, sneezing and sore throat.   Eyes:  Negative for redness.  Respiratory:  Negative for cough, chest tightness and shortness of breath.   Cardiovascular:  Negative for chest pain and palpitations.  Gastrointestinal:  Negative for abdominal pain, constipation, diarrhea, nausea and vomiting.  Endocrine: Negative for polydipsia, polyphagia and polyuria.  Genitourinary:  Negative for dysuria and frequency.  Musculoskeletal:  Negative for arthralgias, back pain, joint swelling and neck pain.  Skin:  Negative for rash.  Neurological: Negative.  Negative for tremors and numbness.  Hematological:  Negative for adenopathy. Does not bruise/bleed easily.  Psychiatric/Behavioral:  Negative for behavioral problems (Depression), sleep disturbance and suicidal ideas. The patient is not nervous/anxious.     Vital Signs: BP 116/75   Pulse 80   Temp 98.3 F (36.8 C)   Resp 16   Ht '5\' 7"'$  (1.702 m)   Wt 153 lb 3.2 oz (69.5 kg)   SpO2 98%   BMI 23.99 kg/m    Physical Exam Vitals reviewed.  Constitutional:      General: He is not in acute distress.    Appearance: Normal appearance. He is normal weight. He is not ill-appearing.  HENT:     Head: Normocephalic and atraumatic.  Cardiovascular:     Rate and Rhythm: Normal rate and regular rhythm.  Pulmonary:     Effort: Pulmonary effort is normal. No respiratory distress.  Neurological:     Mental Status: He is alert and oriented to person, place, and time.  Psychiatric:        Mood and Affect: Mood normal.        Behavior: Behavior normal.        Assessment/Plan: 1. Type 2 diabetes mellitus with hyperglycemia, without long-term current use of insulin (HCC) A1c improved, 6.0 today, continue diet and lifestyle modifications as discussed. Will repeat A1c in 3 months. Urine specimen obtained for urine microalbumin lab, monitor yearly.  - POCT glycosylated hemoglobin (Hb A1C) - Urine  Microalbumin w/creat. ratio  2. Aortic atherosclerosis (HCC) Continue pravastatin as prescribed  3. Gastroesophageal reflux disease without esophagitis Stable, continue omeprazole as prescribed.   General Counseling: Hamad verbalizes understanding of the findings of todays visit and agrees with plan of treatment. I have discussed any further diagnostic evaluation that may be needed or ordered today. We also reviewed his medications today. he has been encouraged to call the office with any questions or concerns that should arise related to todays visit.    Orders Placed This Encounter  Procedures   Urine Microalbumin w/creat. ratio   POCT glycosylated hemoglobin (Hb A1C)    No orders of the defined types were placed in this encounter.   Return in about 3 months (around 05/22/2022) for F/U, Recheck A1C, Caeli Linehan PCP.   Total time spent:30 Minutes Time spent includes review of chart, medications, test  results, and follow up plan with the patient.   [place] Controlled Substance Database was reviewed by me.  This patient was seen by Jonetta Osgood, FNP-C in collaboration with Dr. Clayborn Bigness as a part of collaborative care agreement.   Zareth Rippetoe R. Valetta Fuller, MSN, FNP-C Internal medicine

## 2022-02-20 ENCOUNTER — Encounter: Payer: Self-pay | Admitting: Urology

## 2022-02-20 ENCOUNTER — Ambulatory Visit (INDEPENDENT_AMBULATORY_CARE_PROVIDER_SITE_OTHER): Payer: BC Managed Care – PPO | Admitting: Urology

## 2022-02-20 ENCOUNTER — Encounter: Payer: Self-pay | Admitting: Nurse Practitioner

## 2022-02-20 VITALS — BP 111/70 | HR 78 | Ht 67.0 in | Wt 153.0 lb

## 2022-02-20 DIAGNOSIS — C61 Malignant neoplasm of prostate: Secondary | ICD-10-CM

## 2022-02-20 DIAGNOSIS — R32 Unspecified urinary incontinence: Secondary | ICD-10-CM

## 2022-02-20 DIAGNOSIS — N5231 Erectile dysfunction following radical prostatectomy: Secondary | ICD-10-CM | POA: Diagnosis not present

## 2022-02-20 DIAGNOSIS — Z8546 Personal history of malignant neoplasm of prostate: Secondary | ICD-10-CM

## 2022-02-20 LAB — MICROALBUMIN / CREATININE URINE RATIO
Creatinine, Urine: 255.8 mg/dL
Microalb/Creat Ratio: 12 mg/g creat (ref 0–29)
Microalbumin, Urine: 30.7 ug/mL

## 2022-02-20 NOTE — Progress Notes (Signed)
02/20/2022 1:40 PM   Donald Mendoza 1961-01-15 [id number]  Referring provider: Lavera Guise, MD [address] [place],  [place] [address]  Chief Complaint  Patient presents with   Prostate Cancer    HPI: 30 y male with prostate cancer who returns today for routine follow-up.  He is s/p radical robotic prostatectomy on 05/27/2017. He underwent a full nerve sparing procedure on the left and partial nerve sparing on the right.  Lymph node dissection was also performed.   His surgical pathology is consistent with Gleason 4+3 prostate cancer, large volume of Gleason 4 component greater than 60% of the right nodule.  No extracapsular extension, seminal vesicle invasion, bladder neck invasion.  Margins were negative.  Lymph nodes negative but the left-sided lymph node dissection was somewhat limited due to presence of mesh.  Mild ED, uses Vigara with success.  Initially, he did not have to use this medication but in the recent past, started to need it.  He reports that he only takes 1- 20 mg tablet, occasionally 3 but this is less effective than previously.    He also mentions today that sometimes he will start urinating a little bit before he can get to the bathroom.  He denies any constant leakage.  This happens mostly when he is been drinking beers.  PSA remains undetectable   PMH: Past Medical History:  Diagnosis Date   B12 deficiency 04/21/2017   Cancer ([place]) 2018   Prostate Cancer   Diverticulosis    GERD (gastroesophageal reflux disease)    Glaucoma    Hypercholesteremia     Surgical History: Past Surgical History:  Procedure Laterality Date   COLONOSCOPY     COLONOSCOPY WITH PROPOFOL N/A 01/16/2022   Procedure: COLONOSCOPY WITH PROPOFOL;  Surgeon: [name], MD;  Location: [facility] ENDOSCOPY;  Service: Gastroenterology;  Laterality: N/A;   ESOPHAGOGASTRODUODENOSCOPY N/A 01/16/2022   Procedure: ESOPHAGOGASTRODUODENOSCOPY (EGD);  Surgeon: [name], MD;  Location: [facility]  ENDOSCOPY;  Service: Gastroenterology;  Laterality: N/A;   FOOT SURGERY Right 05/24/2021   HERNIA REPAIR     10 years ago, [facility]. umbilical hernia x 2   PELVIC LYMPH NODE DISSECTION Bilateral 05/27/2017   Procedure: PELVIC LYMPH NODE DISSECTION;  Surgeon: Hollice Espy, MD;  Location: [facility] ORS;  Service: Urology;  Laterality: Bilateral;   ROBOT ASSISTED LAPAROSCOPIC RADICAL PROSTATECTOMY N/A 05/27/2017   Procedure: ROBOTIC ASSISTED LAPAROSCOPIC RADICAL PROSTATECTOMY;  Surgeon: Hollice Espy, MD;  Location: [facility] ORS;  Service: Urology;  Laterality: N/A;    Home Medications:  Allergies as of 02/20/2022       Reactions   Sulfa Antibiotics Hives        Medication List        Accurate as of February 20, 2022  1:40 PM. If you have any questions, ask your nurse or doctor.          STOP taking these medications    omeprazole 40 MG capsule Commonly known as: PRILOSEC Stopped by: Hollice Espy, MD       TAKE these medications    cyanocobalamin 1000 MCG tablet Commonly known as: VITAMIN B12 Take 1 tablet (1,000 mcg total) by mouth daily.   ergocalciferol 1.25 MG (50000 UT) capsule Commonly known as: VITAMIN D2 Take 1 capsule (50,000 Units total) by mouth once a week.   latanoprost 0.005 % ophthalmic solution Commonly known as: XALATAN Place 1 drop into both eyes at bedtime.   pravastatin 40 MG tablet Commonly known as: PRAVACHOL TAKE 1 TABLET(40  MG) BY MOUTH DAILY   sildenafil 50 MG tablet Commonly known as: VIAGRA Take 50 mg by mouth daily as needed for erectile dysfunction.        Allergies:  Allergies  Allergen Reactions   Sulfa Antibiotics Hives    Family History: Family History  Problem Relation Age of Onset   Diabetes Mother    Breast cancer Mother    Arthritis Father    Breast cancer Sister     Social History:  reports that he quit smoking about 3 years ago. His smoking use included cigarettes. He has a 25.50 pack-year smoking history. He  has never used smokeless tobacco. He reports current alcohol use. He reports that he does not use drugs.   Physical Exam: BP 111/70   Pulse 78   Ht '5\' 7"'$  (1.702 m)   Wt 153 lb (69.4 kg)   BMI 23.96 kg/m   Constitutional:  Alert and oriented, No acute distress. HEENT: [place] AT, moist mucus membranes.  Trachea midline, no masses. Neurologic: Grossly intact, no focal deficits, moving all 4 extremities. Psychiatric: Normal mood and affect.  Laboratory Data: Lab Results  Component Value Date   WBC 6.7 09/20/2021   HGB 14.0 09/20/2021   HCT 42.4 09/20/2021   MCV 87.6 09/20/2021   PLT 108 (L) 09/20/2021    Lab Results  Component Value Date   CREATININE 0.89 09/20/2021     Lab Results  Component Value Date   HGBA1C 6.0 (A) 02/19/2022    Assessment & Plan:    1. Prostate cancer ([place]) NED PSA in 1 year  - PSA; Future  2. Erectile dysfunction after radical prostatectomy Okay to increase dose up to 100 mg, take at least 1 hour prior to sexual activity ideally on empty stomach  If this fails to be effective move forward, we can discuss alternatives - PSA; Future  3. Urinary incontinence, unspecified type Difficult to ascertain based on his history whether this is urge incontinence or stress incontinence, exacerbated by alcohol intake  I have recommended that he resume his pelvic floor exercises to strengthen the pelvic floor and cut back on alcoholic beverages, does not have issues outside of this 1 situation   Return in about 1 year (around 02/21/2023) for PSA prior.  Hollice Espy, MD  [facility] [address], [place], [place] [address] [phone]

## 2022-05-22 ENCOUNTER — Other Ambulatory Visit: Payer: Self-pay | Admitting: Nurse Practitioner

## 2022-05-22 DIAGNOSIS — I700 Atherosclerosis of aorta: Secondary | ICD-10-CM

## 2022-05-26 ENCOUNTER — Other Ambulatory Visit: Payer: Self-pay | Admitting: Nurse Practitioner

## 2022-05-26 DIAGNOSIS — H4010X0 Unspecified open-angle glaucoma, stage unspecified: Secondary | ICD-10-CM

## 2022-05-28 NOTE — Telephone Encounter (Signed)
Ok to send eye drops

## 2022-07-17 ENCOUNTER — Encounter: Payer: Self-pay | Admitting: Nurse Practitioner

## 2022-07-17 ENCOUNTER — Ambulatory Visit (INDEPENDENT_AMBULATORY_CARE_PROVIDER_SITE_OTHER): Payer: BC Managed Care – PPO | Admitting: Nurse Practitioner

## 2022-07-17 VITALS — BP 111/71 | HR 85 | Temp 98.3°F | Resp 16 | Ht 67.0 in | Wt 156.2 lb

## 2022-07-17 DIAGNOSIS — M7701 Medial epicondylitis, right elbow: Secondary | ICD-10-CM | POA: Diagnosis not present

## 2022-07-17 MED ORDER — METHOCARBAMOL 500 MG PO TABS: 500.0000 mg | ORAL_TABLET | Freq: Three times a day (TID) | ORAL | 1 refills | Status: DC | PRN

## 2022-07-17 NOTE — Progress Notes (Signed)
[facility] [place], [place] [address]  Internal MEDICINE  Office Visit Note  Patient Name: Donald Mendoza  [id number]  [id number]  Date of Service: 07/17/2022  Chief Complaint  Patient presents with   Acute Visit    Right arm pain      HPI Mattis presents for an acute sick visit for right elbow Right elbow hurting after doing a different job at work carrying more than 50lbs.  Decreased grip strength in his right hand.     Current Medication:  Outpatient Encounter Medications as of 07/17/2022  Medication Sig   ergocalciferol (VITAMIN D2) 1.25 MG (50000 UT) capsule Take 1 capsule (50,000 Units total) by mouth once a week.   latanoprost (XALATAN) 0.005 % ophthalmic solution INSTILL 1 DROP IN BOTH EYES AT BEDTIME   methocarbamol (ROBAXIN) 500 MG tablet Take 1 tablet (500 mg total) by mouth every 8 (eight) hours as needed for muscle spasms.   pravastatin (PRAVACHOL) 40 MG tablet TAKE 1 TABLET(40 MG) BY MOUTH DAILY   sildenafil (VIAGRA) 50 MG tablet Take 50 mg by mouth daily as needed for erectile dysfunction.   vitamin B-12 (CYANOCOBALAMIN) 1000 MCG tablet Take 1 tablet (1,000 mcg total) by mouth daily.   No facility-administered encounter medications on file as of 07/17/2022.      Medical History: Past Medical History:  Diagnosis Date   B12 deficiency 04/21/2017   Cancer ([place]) 2018   Prostate Cancer   Diverticulosis    GERD (gastroesophageal reflux disease)    Glaucoma    Hypercholesteremia      Vital Signs: BP 111/71   Pulse 85   Temp 98.3 F (36.8 C)   Resp 16   Ht 5\' 7"  (1.702 m)   Wt 156 lb 3.2 oz (70.9 kg)   SpO2 99%   BMI 24.46 kg/m    Review of Systems  Constitutional:  Negative for chills, fatigue and unexpected weight change.  HENT:  Negative for congestion, rhinorrhea, sneezing and sore throat.   Eyes:  Negative for redness.  Respiratory:  Negative for cough, chest tightness and shortness of breath.    Cardiovascular:  Negative for chest pain and palpitations.  Gastrointestinal:  Negative for abdominal pain, constipation, diarrhea, nausea and vomiting.  Genitourinary:  Negative for dysuria and frequency.  Musculoskeletal:  Positive for arthralgias. Negative for back pain, joint swelling and neck pain.       Right elbow pain and decreased grip strength in right hand   Skin:  Negative for rash.  Neurological: Negative.  Negative for tremors and numbness.  Hematological:  Negative for adenopathy. Does not bruise/bleed easily.  Psychiatric/Behavioral:  Negative for behavioral problems (Depression), sleep disturbance and suicidal ideas. The patient is not nervous/anxious.     Physical Exam Vitals reviewed.  Constitutional:      General: He is not in acute distress.    Appearance: Normal appearance. He is normal weight. He is not ill-appearing.  HENT:     Head: Normocephalic and atraumatic.  Cardiovascular:     Rate and Rhythm: Normal rate and regular rhythm.  Pulmonary:     Effort: Pulmonary effort is normal. No respiratory distress.  Musculoskeletal:     Right elbow: Decreased range of motion. Tenderness present in medial epicondyle.     Right hand: Decreased range of motion. Decreased strength.  Neurological:     Mental Status: He is alert and oriented to person, place, and time.  Psychiatric:  Mood and Affect: Mood normal.        Behavior: Behavior normal.       Assessment/Plan: 1. Medial epicondylitis of right elbow Muscle relaxer ordered. Information and stretches/exercises provided as well.  - methocarbamol (ROBAXIN) 500 MG tablet; Take 1 tablet (500 mg total) by mouth every 8 (eight) hours as needed for muscle spasms.  Dispense: 90 tablet; Refill: 1   General Counseling: Ol[place] verbalizes understanding of the findings of todays visit and agrees with plan of treatment. I have discussed any further diagnostic evaluation that may be needed or ordered today. We also  reviewed his medications today. he has been encouraged to call the office with any questions or concerns that should arise related to todays visit.    Counseling:    No orders of the defined types were placed in this encounter.   Meds ordered this encounter  Medications   methocarbamol (ROBAXIN) 500 MG tablet    Sig: Take 1 tablet (500 mg total) by mouth every 8 (eight) hours as needed for muscle spasms.    Dispense:  90 tablet    Refill:  1    Fill new script today    Return if symptoms worsen or fail to improve.  [place] Controlled Substance Database was reviewed by me for overdose risk score (ORS)  Time spent:30 Minutes Time spent with patient included reviewing progress notes, labs, imaging studies, and discussing plan for follow up.   This patient was seen by Jonetta Osgood, FNP-C in collaboration with Dr. Clayborn Bigness as a part of collaborative care agreement.  Moroni Nester R. Valetta Fuller, MSN, FNP-C Internal Medicine

## 2022-08-04 ENCOUNTER — Encounter: Payer: Self-pay | Admitting: Nurse Practitioner

## 2022-08-10 DIAGNOSIS — H401111 Primary open-angle glaucoma, right eye, mild stage: Secondary | ICD-10-CM | POA: Diagnosis not present

## 2022-08-10 DIAGNOSIS — H401123 Primary open-angle glaucoma, left eye, severe stage: Secondary | ICD-10-CM | POA: Diagnosis not present

## 2022-08-16 DIAGNOSIS — H401123 Primary open-angle glaucoma, left eye, severe stage: Secondary | ICD-10-CM | POA: Diagnosis not present

## 2022-08-16 DIAGNOSIS — H401111 Primary open-angle glaucoma, right eye, mild stage: Secondary | ICD-10-CM | POA: Diagnosis not present

## 2022-08-16 DIAGNOSIS — H2513 Age-related nuclear cataract, bilateral: Secondary | ICD-10-CM | POA: Diagnosis not present

## 2022-08-22 ENCOUNTER — Ambulatory Visit: Payer: BC Managed Care – PPO | Admitting: Nurse Practitioner

## 2022-08-22 ENCOUNTER — Encounter: Payer: Self-pay | Admitting: Nurse Practitioner

## 2022-08-22 VITALS — BP 113/72 | HR 76 | Temp 98.6°F | Resp 16 | Ht 67.0 in | Wt 156.4 lb

## 2022-08-22 DIAGNOSIS — E538 Deficiency of other specified B group vitamins: Secondary | ICD-10-CM

## 2022-08-22 DIAGNOSIS — I700 Atherosclerosis of aorta: Secondary | ICD-10-CM

## 2022-08-22 DIAGNOSIS — K219 Gastro-esophageal reflux disease without esophagitis: Secondary | ICD-10-CM

## 2022-08-22 DIAGNOSIS — R918 Other nonspecific abnormal finding of lung field: Secondary | ICD-10-CM | POA: Diagnosis not present

## 2022-08-22 DIAGNOSIS — E1165 Type 2 diabetes mellitus with hyperglycemia: Secondary | ICD-10-CM

## 2022-08-22 DIAGNOSIS — E559 Vitamin D deficiency, unspecified: Secondary | ICD-10-CM

## 2022-08-22 LAB — POCT GLYCOSYLATED HEMOGLOBIN (HGB A1C): Hemoglobin A1C: 6.5 % — AB (ref 4.0–5.6)

## 2022-08-22 NOTE — Progress Notes (Signed)
[facility] [address] [place], [place] [address]  Internal MEDICINE  Office Visit Note  Patient Name: Donald Mendoza  [id number]  [id number]  Date of Service: 08/22/2022  Chief Complaint  Patient presents with   Gastroesophageal Reflux   Hypertension   Follow-up    HPI Donald Mendoza presents for a follow-up visit for diabetes and routine screenings.  Diabetes -- last A1c was 6.0 in October. A1c is 6.5 today. Reversed all of the changes Donald Mendoza made to his diet after his A1c dropped to 6.0 so then his A1c jumped back up. States that once the level dropped down, Donald Mendoza thought Donald Mendoza did not need to restrict his eating and the diabetes was gone.  Overdue for carotid US and lung cancer screening -- ok for lung screening, wants to wait until later for carotid ultrasound. Quit smoking in 2020. 25 pack years. Upcoming annual visit, due for routine labs .    Current Medication: Outpatient Encounter Medications as of 08/22/2022  Medication Sig   ergocalciferol (VITAMIN D2) 1.25 MG (50000 UT) capsule Take 1 capsule (50,000 Units total) by mouth once a week.   latanoprost (XALATAN) 0.005 % ophthalmic solution INSTILL 1 DROP IN BOTH EYES AT BEDTIME   methocarbamol (ROBAXIN) 500 MG tablet Take 1 tablet (500 mg total) by mouth every 8 (eight) hours as needed for muscle spasms.   pravastatin (PRAVACHOL) 40 MG tablet TAKE 1 TABLET(40 MG) BY MOUTH DAILY   sildenafil (VIAGRA) 50 MG tablet Take 50 mg by mouth daily as needed for erectile dysfunction.   [DISCONTINUED] vitamin B-12 (CYANOCOBALAMIN) 1000 MCG tablet Take 1 tablet (1,000 mcg total) by mouth daily.   No facility-administered encounter medications on file as of 08/22/2022.    Surgical History: Past Surgical History:  Procedure Laterality Date   COLONOSCOPY     COLONOSCOPY WITH PROPOFOL N/A 01/16/2022   Procedure: COLONOSCOPY WITH PROPOFOL;  Surgeon: Wyline Mood, MD;  Location: [facility] ENDOSCOPY;  Service: Gastroenterology;  Laterality: N/A;    ESOPHAGOGASTRODUODENOSCOPY N/A 01/16/2022   Procedure: ESOPHAGOGASTRODUODENOSCOPY (EGD);  Surgeon: Wyline Mood, MD;  Location: [facility] ENDOSCOPY;  Service: Gastroenterology;  Laterality: N/A;   FOOT SURGERY Right 05/24/2021   HERNIA REPAIR     10 years ago, [facility]. umbilical hernia x 2   PELVIC LYMPH NODE DISSECTION Bilateral 05/27/2017   Procedure: PELVIC LYMPH NODE DISSECTION;  Surgeon: Vanna Scotland, MD;  Location: [facility] ORS;  Service: Urology;  Laterality: Bilateral;   ROBOT ASSISTED LAPAROSCOPIC RADICAL PROSTATECTOMY N/A 05/27/2017   Procedure: ROBOTIC ASSISTED LAPAROSCOPIC RADICAL PROSTATECTOMY;  Surgeon: Vanna Scotland, MD;  Location: [facility] ORS;  Service: Urology;  Laterality: N/A;    Medical History: Past Medical History:  Diagnosis Date   B12 deficiency 04/21/2017   Cancer 2018   Prostate Cancer   Diverticulosis    GERD (gastroesophageal reflux disease)    Glaucoma    Hypercholesteremia     Family History: Family History  Problem Relation Age of Onset   Diabetes Mother    Breast cancer Mother    Arthritis Father    Breast cancer Sister     Social History   Socioeconomic History   Marital status: Single    Spouse name: Not on file   Number of children: Not on file   Years of education: Not on file   Highest education level: Not on file  Occupational History   Not on file  Tobacco Use   Smoking status: Former    Packs/day: 0.75    Years: 34.00  Additional pack years: 0.00    Total pack years: 25.50    Types: Cigarettes    Quit date: 06/15/2018    Years since quitting: 4.2   Smokeless tobacco: Never  Vaping Use   Vaping Use: Never used  Substance and Sexual Activity   Alcohol use: Yes    Comment: socialy    Drug use: No   Sexual activity: Yes  Other Topics Concern   Not on file  Social History Narrative   Not on file   Social Determinants of Health   Financial Resource Strain: Not on file  Food Insecurity: Not on file  Transportation Needs: Not  on file  Physical Activity: Not on file  Stress: Not on file  Social Connections: Not on file  Intimate Partner Violence: Not on file      Review of Systems  Constitutional:  Positive for fatigue. Negative for chills and unexpected weight change.  HENT:  Negative for congestion, rhinorrhea, sneezing and sore throat.   Eyes:  Negative for redness.  Respiratory:  Negative for cough, chest tightness and shortness of breath.   Cardiovascular:  Negative for chest pain and palpitations.  Gastrointestinal:  Negative for abdominal pain, constipation, diarrhea, nausea and vomiting.  Endocrine: Negative for polydipsia, polyphagia and polyuria.  Genitourinary:  Negative for dysuria and frequency.  Musculoskeletal:  Negative for arthralgias, back pain, joint swelling and neck pain.  Skin:  Negative for rash.  Neurological: Negative.  Negative for tremors and numbness.  Hematological:  Negative for adenopathy. Does not bruise/bleed easily.  Psychiatric/Behavioral:  Negative for behavioral problems (Depression), sleep disturbance and suicidal ideas. The patient is not nervous/anxious.     Vital Signs: BP 113/72   Pulse 76   Temp 98.6 F (37 C)   Resp 16   Ht  (1.702 m)   Wt 156 lb 6.4 oz (70.9 kg)   SpO2 99%   BMI 24.50 kg/m    Physical Exam Vitals reviewed.  Constitutional:      General: Donald Mendoza is not in acute distress.    Appearance: Normal appearance. Donald Mendoza is normal weight. Donald Mendoza is not ill-appearing.  HENT:     Head: Normocephalic and atraumatic.  Cardiovascular:     Rate and Rhythm: Normal rate and regular rhythm.  Pulmonary:     Effort: Pulmonary effort is normal. No respiratory distress.  Neurological:     Mental Status: Donald Mendoza is alert and oriented to person, place, and time.  Psychiatric:        Mood and Affect: Mood normal.        Behavior: Behavior normal.        Assessment/Plan: 1. Type 2 diabetes mellitus with hyperglycemia, without long-term current use of  insulin A1c increased 6.5, routine labs ordered, will discuss labs at his physical in may. - POCT glycosylated hemoglobin (Hb A1C) - CMP14+EGFR - Lipid Profile - Hgb A1C w/o eAG - B12 and Folate Panel - Vitamin D (25 hydroxy)  2. Aortic atherosclerosis Routine labs ordered - Lipid Profile - Hgb A1C w/o eAG - B12 and Folate Panel - Vitamin D (25 hydroxy)  3. Multiple lung nodules on CT Low dose CT chest ordered for lung cancer screening - CT CHEST LUNG CA SCREEN LOW DOSE W/O CM; Future - Lipid Profile - Hgb A1C w/o eAG - B12 and Folate Panel - Vitamin D (25 hydroxy)  4. B12 deficiency Routine lab ordered - B12 and Folate Panel  5. Vitamin D deficiency Routine lab ordered  -  Vitamin D (25 hydroxy)   General Counseling: Donald Mendoza verbalizes understanding of the findings of todays visit and agrees with plan of treatment. I have discussed any further diagnostic evaluation that may be needed or ordered today. We also reviewed his medications today. Donald Mendoza has been encouraged to call the office with any questions or concerns that should arise related to todays visit.    Orders Placed This Encounter  Procedures   CT CHEST LUNG CA SCREEN LOW DOSE W/O CM   CMP14+EGFR   Lipid Profile   Hgb A1C w/o eAG   B12 and Folate Panel   Vitamin D (25 hydroxy)   POCT glycosylated hemoglobin (Hb A1C)    No orders of the defined types were placed in this encounter.   Return for previously scheduled, CPE, Kathlen Sakurai PCP upcoming in may.   Total time spent:30 Minutes Time spent includes review of chart, medications, test results, and follow up plan with the patient.   [place] Controlled Substance Database was reviewed by me.  This patient was seen by Sallyanne Kuster, FNP-C in collaboration with Dr. Beverely Risen as a part of collaborative care agreement.   Chad Tiznado R. Tedd Sias, MSN, FNP-C Internal medicine

## 2022-08-27 ENCOUNTER — Other Ambulatory Visit: Payer: Self-pay | Admitting: *Deleted

## 2022-08-27 MED ORDER — VITAMIN B-12 1000 MCG PO TABS: 1000.0000 ug | ORAL_TABLET | Freq: Every day | ORAL | 1 refills | Status: DC

## 2022-09-08 ENCOUNTER — Encounter: Payer: Self-pay | Admitting: Nurse Practitioner

## 2022-09-20 ENCOUNTER — Other Ambulatory Visit: Payer: Self-pay | Admitting: Nurse Practitioner

## 2022-09-20 DIAGNOSIS — H4010X0 Unspecified open-angle glaucoma, stage unspecified: Secondary | ICD-10-CM

## 2022-09-21 ENCOUNTER — Ambulatory Visit
Admission: RE | Admit: 2022-09-21 | Discharge: 2022-09-21 | Disposition: A | Discharge status: Home / Self Care | Payer: BC Managed Care – PPO | Source: Ambulatory Visit | Attending: Nurse Practitioner | Admitting: Nurse Practitioner

## 2022-09-21 DIAGNOSIS — Z87891 Personal history of nicotine dependence: Secondary | ICD-10-CM | POA: Diagnosis not present

## 2022-09-21 DIAGNOSIS — E1165 Type 2 diabetes mellitus with hyperglycemia: Secondary | ICD-10-CM | POA: Diagnosis not present

## 2022-09-21 DIAGNOSIS — K219 Gastro-esophageal reflux disease without esophagitis: Secondary | ICD-10-CM | POA: Diagnosis not present

## 2022-09-21 DIAGNOSIS — R918 Other nonspecific abnormal finding of lung field: Secondary | ICD-10-CM

## 2022-09-21 DIAGNOSIS — Z122 Encounter for screening for malignant neoplasm of respiratory organs: Secondary | ICD-10-CM | POA: Diagnosis not present

## 2022-09-21 DIAGNOSIS — I700 Atherosclerosis of aorta: Secondary | ICD-10-CM | POA: Diagnosis not present

## 2022-09-21 DIAGNOSIS — E559 Vitamin D deficiency, unspecified: Secondary | ICD-10-CM | POA: Diagnosis not present

## 2022-09-21 DIAGNOSIS — E538 Deficiency of other specified B group vitamins: Secondary | ICD-10-CM | POA: Diagnosis not present

## 2022-09-22 LAB — LIPID PANEL
Chol/HDL Ratio: 3.3 ratio (ref 0.0–5.0)
Cholesterol, Total: 117 mg/dL (ref 100–199)
HDL: 36 mg/dL — ABNORMAL LOW (ref >39)
LDL Chol Calc (NIH): 67 mg/dL (ref 0–99)
Triglycerides: 64 mg/dL (ref 0–149)
VLDL Cholesterol Cal: 14 mg/dL (ref 5–40)

## 2022-09-22 LAB — CMP14+EGFR
ALT: 21 IU/L (ref 0–44)
AST: 21 IU/L (ref 0–40)
Albumin/Globulin Ratio: 1.7 (ref 1.2–2.2)
Albumin: 4.3 g/dL (ref 3.8–4.9)
Alkaline Phosphatase: 112 IU/L (ref 44–121)
BUN/Creatinine Ratio: 13 (ref 10–24)
BUN: 11 mg/dL (ref 8–27)
Bilirubin Total: 0.4 mg/dL (ref 0.0–1.2)
CO2: 24 mmol/L (ref 20–29)
Calcium: 9.5 mg/dL (ref 8.6–10.2)
Chloride: 103 mmol/L (ref 96–106)
Creatinine, Ser: 0.84 mg/dL (ref 0.76–1.27)
Globulin, Total: 2.5 g/dL (ref 1.5–4.5)
Glucose: 96 mg/dL (ref 70–99)
Potassium: 4.4 mmol/L (ref 3.5–5.2)
Sodium: 140 mmol/L (ref 134–144)
Total Protein: 6.8 g/dL (ref 6.0–8.5)
eGFR: 100 mL/min/{1.73_m2} (ref >59)

## 2022-09-22 LAB — VITAMIN D 25 HYDROXY (VIT D DEFICIENCY, FRACTURES): Vit D, 25-Hydroxy: 17.6 ng/mL — ABNORMAL LOW (ref 30.0–100.0)

## 2022-09-22 LAB — B12 AND FOLATE PANEL
Folate: 9.4 ng/mL (ref >3.0)
Vitamin B-12: 552 pg/mL (ref 232–1245)

## 2022-09-22 LAB — HGB A1C W/O EAG: Hgb A1c MFr Bld: 6.6 % — ABNORMAL HIGH (ref 4.8–5.6)

## 2022-09-22 NOTE — Progress Notes (Signed)
We will discuss results at his upcoming office visit.

## 2022-09-24 ENCOUNTER — Other Ambulatory Visit: Payer: Self-pay

## 2022-09-24 DIAGNOSIS — C61 Malignant neoplasm of prostate: Secondary | ICD-10-CM

## 2022-09-25 ENCOUNTER — Inpatient Hospital Stay: Payer: BC Managed Care – PPO | Attending: Oncology

## 2022-09-25 ENCOUNTER — Inpatient Hospital Stay (HOSPITAL_BASED_OUTPATIENT_CLINIC_OR_DEPARTMENT_OTHER): Payer: BC Managed Care – PPO | Admitting: Oncology

## 2022-09-25 ENCOUNTER — Encounter: Payer: Self-pay | Admitting: Oncology

## 2022-09-25 VITALS — BP 123/79 | HR 66 | Temp 97.8°F | Resp 18 | Wt 157.2 lb

## 2022-09-25 DIAGNOSIS — E538 Deficiency of other specified B group vitamins: Secondary | ICD-10-CM | POA: Diagnosis not present

## 2022-09-25 DIAGNOSIS — D696 Thrombocytopenia, unspecified: Secondary | ICD-10-CM

## 2022-09-25 DIAGNOSIS — Z789 Other specified health status: Secondary | ICD-10-CM

## 2022-09-25 DIAGNOSIS — Z87891 Personal history of nicotine dependence: Secondary | ICD-10-CM | POA: Insufficient documentation

## 2022-09-25 DIAGNOSIS — Z803 Family history of malignant neoplasm of breast: Secondary | ICD-10-CM | POA: Insufficient documentation

## 2022-09-25 DIAGNOSIS — C61 Malignant neoplasm of prostate: Secondary | ICD-10-CM | POA: Diagnosis not present

## 2022-09-25 DIAGNOSIS — D693 Immune thrombocytopenic purpura: Secondary | ICD-10-CM | POA: Insufficient documentation

## 2022-09-25 DIAGNOSIS — Z9079 Acquired absence of other genital organ(s): Secondary | ICD-10-CM | POA: Diagnosis not present

## 2022-09-25 DIAGNOSIS — Z79899 Other long term (current) drug therapy: Secondary | ICD-10-CM | POA: Diagnosis not present

## 2022-09-25 LAB — CBC (CANCER CENTER ONLY)
HCT: 42.3 % (ref 39.0–52.0)
Hemoglobin: 13.9 g/dL (ref 13.0–17.0)
MCH: 29 pg (ref 26.0–34.0)
MCHC: 32.9 g/dL (ref 30.0–36.0)
MCV: 88.3 fL (ref 80.0–100.0)
Platelet Count: 95 10*3/uL — ABNORMAL LOW (ref 150–400)
RBC: 4.79 MIL/uL (ref 4.22–5.81)
RDW: 14 % (ref 11.5–15.5)
WBC Count: 5 10*3/uL (ref 4.0–10.5)
nRBC: 0 % (ref 0.0–0.2)

## 2022-09-25 LAB — CMP (CANCER CENTER ONLY)
ALT: 19 U/L (ref 0–44)
AST: 20 U/L (ref 15–41)
Albumin: 3.8 g/dL (ref 3.5–5.0)
Alkaline Phosphatase: 94 U/L (ref 38–126)
Anion gap: 6 (ref 5–15)
BUN: 10 mg/dL (ref 6–20)
CO2: 27 mmol/L (ref 22–32)
Calcium: 8.7 mg/dL — ABNORMAL LOW (ref 8.9–10.3)
Chloride: 105 mmol/L (ref 98–111)
Creatinine: 0.81 mg/dL (ref 0.61–1.24)
GFR, Estimated: >60 mL/min (ref >60)
Glucose, Bld: 137 mg/dL — ABNORMAL HIGH (ref 70–99)
Potassium: 3.8 mmol/L (ref 3.5–5.1)
Sodium: 138 mmol/L (ref 135–145)
Total Bilirubin: 0.6 mg/dL (ref 0.3–1.2)
Total Protein: 6.6 g/dL (ref 6.5–8.1)

## 2022-09-25 NOTE — Assessment & Plan Note (Addendum)
#   Chronic thrombocytopenia:  ITP, with possible ETOH marrow suppression.  Labs are reviewed and discussed with patient. Platelet count is 95,000 Continue observation

## 2022-09-25 NOTE — Assessment & Plan Note (Signed)
Prostate Cancer pT2(m) pN0 PSA 5.2 gleason score 4+3, grade group 3--Stage IIC, unfavorable intermediate risk group. status post prostatectomy BPLND .   PSA < 0.01 he follows up with urology Dr. Apolinar Junes annually.

## 2022-09-25 NOTE — Progress Notes (Signed)
Hematology/Oncology Progress note [phone] [phone]       PURPOSE OF CONSULTATION:  Follow up of management of thrombocytopenia.  ASSESSMENT & PLAN:   Thrombocytopenia (HCC) # Chronic thrombocytopenia:  ITP, with possible ETOH marrow suppression.  Labs are reviewed and discussed with patient. Platelet count is 95,000 Continue observation   B12 deficiency # History of vitamin B12 deficiency, B12 level is n 500s Continue B12 1000 MCG daily.   Prostate cancer (HCC)  Prostate Cancer pT2(m) pN0 PSA 5.2 gleason score 4+3, grade group 3--Stage IIC, unfavorable intermediate risk group. status post prostatectomy BPLND .   PSA < 0.01 he follows up with urology Dr. Apolinar Junes annually.    Alcohol use Alcohol cessation was discussed with patient.    Orders Placed This Encounter  Procedures   CMP (Cancer Center only)    Standing Status:   Future    Standing Expiration Date:   09/25/2023   CBC with Differential (Cancer Center Only)    Standing Status:   Future    Standing Expiration Date:   09/25/2023   Follow up in 1 year  All questions were answered. The patient knows to call the clinic with any problems, questions or concerns.  Rickard Patience, MD, PhD [facility] Hematology Oncology 09/25/2022   HISTORY OF PRESENTING ILLNESS:  Donald Mendoza is a  62 y.o.  male with PMH listed below who was referred to me for evaluation of thrombocytopenia. Patient was recently diagnosed with intermediate risk prostate cancer in the follows up with Dr. Apolinar Junes. Initially he was diagnosed with a low-risk prostate cancer Gleason score 3+3 in March 2018 when his PSA was 4. PSA continued to rise to 5.2 and patient had a repeat prostate biopsy revealing Gleason score 4+3. Patient was scheduled for radical prostatectomy however was found to have thrombocytopenia. Referred to [place] to evaluation for the low platelet count. Patient reports that he has been told that his platelet has been low  in the past. He never received any treatment.  # S/p RALP with BPLND 05/27/2017, pathology showed Gleason 4+3, pT2 N0, NEGATIVE FOR EXTRAPROSTATIC EXTENSION, SEMINAL VESICLE INVASION, AND BLADDER NECK INVASION. - MARGINS NEGATIVE FOR CARCINOMA.  # Prostate cancer pT2N0, PSA <0.1, no adverse features, , no adjuvant treatment was offered.  # 11/15/2017 bone marrow biopsy showed normocellular bone marrow for age with trilineage hematopoiesis.  Bone marrow showed megakaryocytes with predominantly normal morphology.  Suggesting peripheral destruction, sequestration or consumption of platelets. Bone marrow cytogenetics were normal. Discussed with patient about his bone marrow biopsy results.  His chronic thrombocytopenia most likely secondary to ITP, possibly due to underlying autoimmune process.    INTERVAL HISTORY 62 y.o. male with history of prostate cancer s/p prostectomy, ITP presents for follow-up.  No bleeding events.  + he drinks alcohol, more than his previous baseline.    Review of Systems  Constitutional:  Negative for chills, fever, malaise/fatigue and weight loss.  HENT:  Negative for sore throat.   Eyes:  Negative for redness.  Respiratory:  Negative for cough, shortness of breath and wheezing.   Cardiovascular:  Negative for chest pain, palpitations and leg swelling.  Gastrointestinal:  Negative for abdominal pain, blood in stool, nausea and vomiting.       Dysphagia  Genitourinary:  Negative for dysuria.  Musculoskeletal:  Negative for myalgias.  Skin:  Negative for rash.  Neurological:  Negative for dizziness, tingling and tremors.  Endo/Heme/Allergies:  Does not bruise/bleed easily.  Psychiatric/Behavioral:  Negative for hallucinations.  MEDICAL HISTORY:  Past Medical History:  Diagnosis Date   B12 deficiency 04/21/2017   Cancer (HCC) 2018   Prostate Cancer   Diverticulosis    GERD (gastroesophageal reflux disease)    Glaucoma    Hypercholesteremia     SURGICAL  HISTORY: Past Surgical History:  Procedure Laterality Date   COLONOSCOPY     COLONOSCOPY WITH PROPOFOL N/A 01/16/2022   Procedure: COLONOSCOPY WITH PROPOFOL;  Surgeon: Wyline Mood, MD;  Location: [facility] ENDOSCOPY;  Service: Gastroenterology;  Laterality: N/A;   ESOPHAGOGASTRODUODENOSCOPY N/A 01/16/2022   Procedure: ESOPHAGOGASTRODUODENOSCOPY (EGD);  Surgeon: Wyline Mood, MD;  Location: [facility] ENDOSCOPY;  Service: Gastroenterology;  Laterality: N/A;   FOOT SURGERY Right 05/24/2021   HERNIA REPAIR     10 years ago, [facility]. umbilical hernia x 2   PELVIC LYMPH NODE DISSECTION Bilateral 05/27/2017   Procedure: PELVIC LYMPH NODE DISSECTION;  Surgeon: Vanna Scotland, MD;  Location: [facility] ORS;  Service: Urology;  Laterality: Bilateral;   ROBOT ASSISTED LAPAROSCOPIC RADICAL PROSTATECTOMY N/A 05/27/2017   Procedure: ROBOTIC ASSISTED LAPAROSCOPIC RADICAL PROSTATECTOMY;  Surgeon: Vanna Scotland, MD;  Location: [facility] ORS;  Service: Urology;  Laterality: N/A;    SOCIAL HISTORY: Social History   Socioeconomic History   Marital status: Single    Spouse name: Not on file   Number of children: Not on file   Years of education: Not on file   Highest education level: Not on file  Occupational History   Not on file  Tobacco Use   Smoking status: Former    Packs/day: 0.75    Years: 34.00    Additional pack years: 0.00    Total pack years: 25.50    Types: Cigarettes    Quit date: 06/15/2018    Years since quitting: 4.2   Smokeless tobacco: Never  Vaping Use   Vaping Use: Never used  Substance and Sexual Activity   Alcohol use: Yes    Comment: socialy    Drug use: No   Sexual activity: Yes  Other Topics Concern   Not on file  Social History Narrative   Not on file   Social Determinants of Health   Financial Resource Strain: Not on file  Food Insecurity: Not on file  Transportation Needs: Not on file  Physical Activity: Not on file  Stress: Not on file  Social Connections: Not on file   Intimate Partner Violence: Not on file    FAMILY HISTORY: Family History  Problem Relation Age of Onset   Diabetes Mother    Breast cancer Mother    Arthritis Father    Breast cancer Sister     ALLERGIES:  is allergic to sulfa antibiotics.  MEDICATIONS:  Current Outpatient Medications  Medication Sig Dispense Refill   cyanocobalamin (VITAMIN B12) 1000 MCG tablet Take 1 tablet (1,000 mcg total) by mouth daily. 90 tablet 1   latanoprost (XALATAN) 0.005 % ophthalmic solution INSTILL 1 DROP IN BOTH EYES AT BEDTIME 5 mL 3   pravastatin (PRAVACHOL) 40 MG tablet TAKE 1 TABLET(40 MG) BY MOUTH DAILY 30 tablet 5   sildenafil (VIAGRA) 50 MG tablet Take 50 mg by mouth daily as needed for erectile dysfunction.     No current facility-administered medications for this visit.     PHYSICAL EXAMINATION: ECOG PERFORMANCE STATUS: 0 - Asymptomatic Vitals:   09/25/22 1019  BP: 123/79  Pulse: 66  Resp: 18  Temp: 97.8 F (36.6 C)   Filed Weights   09/25/22 1019  Weight: 157 lb 3.2 oz (71.3  kg)    Physical Exam Constitutional:      General: He is not in acute distress.    Appearance: He is not diaphoretic.  HENT:     Head: Normocephalic and atraumatic.     Mouth/Throat:     Pharynx: No oropharyngeal exudate.  Eyes:     General: No scleral icterus.    Pupils: Pupils are equal, round, and reactive to light.  Cardiovascular:     Rate and Rhythm: Normal rate and regular rhythm.     Heart sounds: No murmur heard. Pulmonary:     Effort: Pulmonary effort is normal. No respiratory distress.     Breath sounds: No rales.  Chest:     Chest wall: No tenderness.  Abdominal:     General: There is no distension.     Palpations: Abdomen is soft.     Tenderness: There is no abdominal tenderness.  Musculoskeletal:        General: Normal range of motion.     Cervical back: Normal range of motion and neck supple.  Skin:    General: Skin is warm and dry.     Findings: No erythema.   Neurological:     Mental Status: He is alert and oriented to person, place, and time.     Cranial Nerves: No cranial nerve deficit.     Motor: No abnormal muscle tone.     Coordination: Coordination normal.  Psychiatric:        Mood and Affect: Affect normal.      LABORATORY DATA:  I have reviewed the data as listed     Latest Ref Rng & Units 09/25/2022   10:09 AM 09/20/2021   11:23 AM 03/03/2021    2:19 PM  CBC  WBC 4.0 - 10.5 K/uL 5.0  6.7  4.9   Hemoglobin 13.0 - 17.0 g/dL 16.1  09.6  04.5   Hematocrit 39.0 - 52.0 % 42.3  42.4  41.7   Platelets 150 - 400 K/uL 95  108  115       Latest Ref Rng & Units 09/25/2022   10:08 AM 09/21/2022   10:20 AM 09/20/2021   11:23 AM  CMP  Glucose 70 - 99 mg/dL 409  96  811   BUN 6 - 20 mg/dL 10  11  10    Creatinine 0.61 - 1.24 mg/dL 9.14  7.82  9.56   Sodium 135 - 145 mmol/L 138  140  136   Potassium 3.5 - 5.1 mmol/L 3.8  4.4  3.6   Chloride 98 - 111 mmol/L 105  103  104   CO2 22 - 32 mmol/L 27  24  27    Calcium 8.9 - 10.3 mg/dL 8.7  9.5  9.3   Total Protein 6.5 - 8.1 g/dL 6.6  6.8  7.6   Total Bilirubin 0.3 - 1.2 mg/dL 0.6  0.4  0.7   Alkaline Phos 38 - 126 U/L 94  112  98   AST 15 - 41 U/L 20  21  19    ALT 0 - 44 U/L 19  21  20

## 2022-09-25 NOTE — Assessment & Plan Note (Signed)
Alcohol cessation was discussed with patient. 

## 2022-09-25 NOTE — Assessment & Plan Note (Signed)
#   History of vitamin B12 deficiency, B12 level is n 500s Continue B12 1000 MCG daily.

## 2022-10-18 ENCOUNTER — Encounter: Payer: Self-pay | Admitting: Nurse Practitioner

## 2022-10-18 ENCOUNTER — Ambulatory Visit (INDEPENDENT_AMBULATORY_CARE_PROVIDER_SITE_OTHER): Payer: BC Managed Care – PPO | Admitting: Nurse Practitioner

## 2022-10-18 VITALS — BP 126/78 | HR 91 | Temp 98.4°F | Resp 16 | Ht 67.0 in | Wt 155.2 lb

## 2022-10-18 DIAGNOSIS — Z0001 Encounter for general adult medical examination with abnormal findings: Secondary | ICD-10-CM

## 2022-10-18 DIAGNOSIS — H4010X0 Unspecified open-angle glaucoma, stage unspecified: Secondary | ICD-10-CM

## 2022-10-18 DIAGNOSIS — Z23 Encounter for immunization: Secondary | ICD-10-CM

## 2022-10-18 DIAGNOSIS — E1165 Type 2 diabetes mellitus with hyperglycemia: Secondary | ICD-10-CM | POA: Diagnosis not present

## 2022-10-18 DIAGNOSIS — E559 Vitamin D deficiency, unspecified: Secondary | ICD-10-CM

## 2022-10-18 DIAGNOSIS — I700 Atherosclerosis of aorta: Secondary | ICD-10-CM | POA: Diagnosis not present

## 2022-10-18 DIAGNOSIS — R300 Dysuria: Secondary | ICD-10-CM

## 2022-10-18 MED ORDER — VITAMIN D (ERGOCALCIFEROL) 1.25 MG (50000 UNIT) PO CAPS
50000.0000 [IU] | ORAL_CAPSULE | ORAL | 3 refills | Status: DC
Start: 2022-10-18 — End: 2022-12-25

## 2022-10-18 MED ORDER — LATANOPROST 0.005 % OP SOLN: OPHTHALMIC | 3 refills | Status: DC

## 2022-10-18 MED ORDER — TETANUS-DIPHTH-ACELL PERTUSSIS 5-2.5-18.5 LF-MCG/0.5 IM SUSP
0.5000 mL | Freq: Once | INTRAMUSCULAR | 0 refills | Status: AC
Start: 2022-10-18 — End: 2022-10-18

## 2022-10-18 NOTE — Progress Notes (Signed)
[facility] [address] [place], [place] [address]  Internal MEDICINE  Office Visit Note  Patient Name: Donald Mendoza  [id number]  [id number]  Date of Service: 10/18/2022  Chief Complaint  Patient presents with   Gastroesophageal Reflux   Hyperlipidemia   Annual Exam    Review ct     HPI Barkley presents for an annual well visit and physical exam.  Well-appearing 62 y.o. male with OSA, GERD, diabetes, high cholesterol and prostate cancer.  Routine CRC screening: done in 2023.  Eye exam: sees eye doctor regularly.   foot exam: done today Labs: discussed results today New or worsening pain: none  Low vitamin D A1c 6.6, improving  LDL is 67, on pravastatin. 40 mg daily.    Current Medication: Outpatient Encounter Medications as of 10/18/2022  Medication Sig   cyanocobalamin (VITAMIN B12) 1000 MCG tablet Take 1 tablet (1,000 mcg total) by mouth daily.   pravastatin (PRAVACHOL) 40 MG tablet TAKE 1 TABLET(40 MG) BY MOUTH DAILY   sildenafil (VIAGRA) 50 MG tablet Take 50 mg by mouth daily as needed for erectile dysfunction.   Vitamin D, Ergocalciferol, (DRISDOL) 1.25 MG (50000 UNIT) CAPS capsule Take 1 capsule (50,000 Units total) by mouth every 7 (seven) days.   [DISCONTINUED] latanoprost (XALATAN) 0.005 % ophthalmic solution INSTILL 1 DROP IN BOTH EYES AT BEDTIME   [DISCONTINUED] Tdap (BOOSTRIX) 5-2.5-18.5 LF-MCG/0.5 injection Inject 0.5 mLs into the muscle once.   latanoprost (XALATAN) 0.005 % ophthalmic solution INSTILL 1 DROP IN BOTH EYES AT BEDTIME   Tdap (BOOSTRIX) 5-2.5-18.5 LF-MCG/0.5 injection Inject 0.5 mLs into the muscle once for 1 dose.   [DISCONTINUED] ergocalciferol (VITAMIN D2) 1.25 MG (50000 UT) capsule Take 1 capsule (50,000 Units total) by mouth once a week.   [DISCONTINUED] latanoprost (XALATAN) 0.005 % ophthalmic solution Place 1 drop into both eyes at bedtime.   [DISCONTINUED] omeprazole (PRILOSEC) 40 MG capsule Take 1 capsule (40 mg total) by  mouth daily.   [DISCONTINUED] pravastatin (PRAVACHOL) 40 MG tablet TAKE 1 TABLET(40 MG) BY MOUTH DAILY   [DISCONTINUED] vitamin B-12 (CYANOCOBALAMIN) 1000 MCG tablet Take 1 tablet (1,000 mcg total) by mouth daily.   [DISCONTINUED] vitamin B-12 (CYANOCOBALAMIN) 1000 MCG tablet Take 1 tablet (1,000 mcg total) by mouth daily.   No facility-administered encounter medications on file as of 10/18/2022.    Surgical History: Past Surgical History:  Procedure Laterality Date   COLONOSCOPY     COLONOSCOPY WITH PROPOFOL N/A 01/16/2022   Procedure: COLONOSCOPY WITH PROPOFOL;  Surgeon: Wyline Mood, MD;  Location: [facility] ENDOSCOPY;  Service: Gastroenterology;  Laterality: N/A;   ESOPHAGOGASTRODUODENOSCOPY N/A 01/16/2022   Procedure: ESOPHAGOGASTRODUODENOSCOPY (EGD);  Surgeon: Wyline Mood, MD;  Location: [facility] ENDOSCOPY;  Service: Gastroenterology;  Laterality: N/A;   FOOT SURGERY Right 05/24/2021   HERNIA REPAIR     10 years ago, [facility]. umbilical hernia x 2   PELVIC LYMPH NODE DISSECTION Bilateral 05/27/2017   Procedure: PELVIC LYMPH NODE DISSECTION;  Surgeon: Vanna Scotland, MD;  Location: [facility] ORS;  Service: Urology;  Laterality: Bilateral;   ROBOT ASSISTED LAPAROSCOPIC RADICAL PROSTATECTOMY N/A 05/27/2017   Procedure: ROBOTIC ASSISTED LAPAROSCOPIC RADICAL PROSTATECTOMY;  Surgeon: Vanna Scotland, MD;  Location: [facility] ORS;  Service: Urology;  Laterality: N/A;    Medical History: Past Medical History:  Diagnosis Date   B12 deficiency 04/21/2017   Cancer (HCC) 2018   Prostate Cancer   Diverticulosis    GERD (gastroesophageal reflux disease)    Glaucoma    Hypercholesteremia  Family History: Family History  Problem Relation Age of Onset   Diabetes Mother    Breast cancer Mother    Arthritis Father    Breast cancer Sister     Social History   Socioeconomic History   Marital status: Single    Spouse name: Not on file   Number of children: Not on file   Years of education: Not on file    Highest education level: Not on file  Occupational History   Not on file  Tobacco Use   Smoking status: Former    Packs/day: 0.75    Years: 34.00    Additional pack years: 0.00    Total pack years: 25.50    Types: Cigarettes    Quit date: 06/15/2018    Years since quitting: 4.3   Smokeless tobacco: Never  Vaping Use   Vaping Use: Never used  Substance and Sexual Activity   Alcohol use: Yes    Comment: socialy    Drug use: No   Sexual activity: Yes  Other Topics Concern   Not on file  Social History Narrative   Not on file   Social Determinants of Health   Financial Resource Strain: Not on file  Food Insecurity: Not on file  Transportation Needs: Not on file  Physical Activity: Not on file  Stress: Not on file  Social Connections: Not on file  Intimate Partner Violence: Not on file      Review of Systems  Constitutional:  Negative for activity change, appetite change, chills, fatigue, fever and unexpected weight change.  HENT: Negative.  Negative for congestion, ear pain, rhinorrhea, sore throat and trouble swallowing.   Eyes: Negative.   Respiratory:  Negative for cough, chest tightness, shortness of breath and wheezing.   Cardiovascular: Negative.  Negative for chest pain.  Gastrointestinal: Negative.  Negative for abdominal pain, blood in stool, constipation, diarrhea, nausea and vomiting.  Endocrine: Negative.   Genitourinary: Negative.  Negative for difficulty urinating, dysuria, frequency, hematuria and urgency.  Musculoskeletal: Negative.  Negative for arthralgias, back pain, joint swelling, myalgias and neck pain.  Skin: Negative.  Negative for rash and wound.  Allergic/Immunologic: Negative.  Negative for immunocompromised state.  Neurological: Negative.  Negative for dizziness, seizures, numbness and headaches.  Hematological: Negative.   Psychiatric/Behavioral: Negative.  Negative for behavioral problems, self-injury and suicidal ideas. The patient is not  nervous/anxious.     Vital Signs: BP 126/78   Pulse 91   Temp 98.4 F (36.9 C)   Resp 16   Ht 5\' 7"  (1.702 m)   Wt 155 lb 3.2 oz (70.4 kg)   SpO2 97%   BMI 24.31 kg/m    Physical Exam Vitals reviewed.  Constitutional:      General: He is awake. He is not in acute distress.    Appearance: Normal appearance. He is well-developed and normal weight. He is not ill-appearing or diaphoretic.  HENT:     Head: Normocephalic and atraumatic.     Right Ear: Tympanic membrane, ear canal and external ear normal.     Left Ear: Tympanic membrane, ear canal and external ear normal.     Nose: Nose normal. No congestion or rhinorrhea.     Mouth/Throat:     Lips: Pink.     Mouth: Mucous membranes are moist.     Pharynx: Oropharynx is clear. Uvula midline. No oropharyngeal exudate or posterior oropharyngeal erythema.  Eyes:     General: Lids are normal. Vision grossly intact. Gaze aligned  appropriately. No scleral icterus.       Right eye: No discharge.        Left eye: No discharge.     Extraocular Movements: Extraocular movements intact.     Conjunctiva/sclera: Conjunctivae normal.     Pupils: Pupils are equal, round, and reactive to light.     Funduscopic exam:    Right eye: Red reflex present.        Left eye: Red reflex present. Neck:     Thyroid: No thyromegaly.     Vascular: No JVD.     Trachea: Trachea and phonation normal. No tracheal deviation.  Cardiovascular:     Rate and Rhythm: Normal rate and regular rhythm.     Heart sounds: Normal heart sounds, S1 normal and S2 normal. No murmur heard.    No friction rub. No gallop.  Pulmonary:     Effort: Pulmonary effort is normal. No accessory muscle usage or respiratory distress.     Breath sounds: Normal breath sounds and air entry. No stridor. No wheezing or rales.  Chest:     Chest wall: No tenderness.  Abdominal:     General: Bowel sounds are normal. There is no distension.     Palpations: Abdomen is soft. There is no mass.      Tenderness: There is no abdominal tenderness. There is no guarding or rebound.  Musculoskeletal:        General: No tenderness or deformity. Normal range of motion.     Cervical back: Normal range of motion and neck supple.     Right lower leg: No edema.     Left lower leg: No edema.  Lymphadenopathy:     Cervical: No cervical adenopathy.  Skin:    General: Skin is warm and dry.     Coloration: Skin is not pale.     Findings: No erythema or rash.  Neurological:     Mental Status: He is alert.     Cranial Nerves: No cranial nerve deficit.     Motor: No abnormal muscle tone.     Coordination: Coordination normal.     Deep Tendon Reflexes: Reflexes are normal and symmetric.  Psychiatric:        Behavior: Behavior normal. Behavior is cooperative.        Thought Content: Thought content normal.        Judgment: Judgment normal.        Assessment/Plan: 1. Encounter for routine adult health examination with abnormal findings Age-appropriate preventive screenings and vaccinations discussed, annual physical exam completed. Routine labs for health maintenance results discussed with patient today. PHM updated.   2. Type 2 diabetes mellitus with hyperglycemia, without long-term current use of insulin (HCC) Continue diet and lifestyle modifications   3. Aortic atherosclerosis (HCC) Continue pravastatin as prescribed.   4. Open-angle glaucoma, unspecified glaucoma stage, unspecified laterality, unspecified open-angle glaucoma type Continue lantanoprost drops  - latanoprost (XALATAN) 0.005 % ophthalmic solution; INSTILL 1 DROP IN BOTH EYES AT BEDTIME  Dispense: 5 mL; Refill: 3  5. Vitamin D deficiency Continue vitamin D weekly as prescribed.  - Vitamin D, Ergocalciferol, (DRISDOL) 1.25 MG (50000 UNIT) CAPS capsule; Take 1 capsule (50,000 Units total) by mouth every 7 (seven) days.  Dispense: 12 capsule; Refill: 3  6. Dysuria Routine urinalysis done  - UA/M w/rflx Culture,  Routine - Microscopic Examination - Urine Culture, Reflex  7. Need for vaccination - Tdap (BOOSTRIX) 5-2.5-18.5 LF-MCG/0.5 injection; Inject 0.5 mLs into the muscle once for  1 dose.  Dispense: 0.5 mL; Refill: 0      General Counseling: Eljay verbalizes understanding of the findings of todays visit and agrees with plan of treatment. I have discussed any further diagnostic evaluation that may be needed or ordered today. We also reviewed his medications today. he has been encouraged to call the office with any questions or concerns that should arise related to todays visit.    Orders Placed This Encounter  Procedures   UA/M w/rflx Culture, Routine    Meds ordered this encounter  Medications   Tdap (BOOSTRIX) 5-2.5-18.5 LF-MCG/0.5 injection    Sig: Inject 0.5 mLs into the muscle once for 1 dose.    Dispense:  0.5 mL    Refill:  0   Vitamin D, Ergocalciferol, (DRISDOL) 1.25 MG (50000 UNIT) CAPS capsule    Sig: Take 1 capsule (50,000 Units total) by mouth every 7 (seven) days.    Dispense:  12 capsule    Refill:  3   latanoprost (XALATAN) 0.005 % ophthalmic solution    Sig: INSTILL 1 DROP IN BOTH EYES AT BEDTIME    Dispense:  5 mL    Refill:  3    **Patient requests 90 days supply**    Return in about 6 months (around 04/20/2023) for F/U, BP check, Harlea Goetzinger PCP, med refill.   Total time spent:30 Minutes Time spent includes review of chart, medications, test results, and follow up plan with the patient.   [place] Controlled Substance Database was reviewed by me.  This patient was seen by Sallyanne Kuster, FNP-C in collaboration with Dr. Beverely Risen as a part of collaborative care agreement.  Collette Pescador R. Tedd Sias, MSN, FNP-C Internal medicine

## 2022-10-19 LAB — MICROSCOPIC EXAMINATION
Bacteria, UA: NONE SEEN
RBC, Urine: NONE SEEN /hpf (ref 0–2)
WBC, UA: NONE SEEN /hpf (ref 0–5)

## 2022-10-19 LAB — UA/M W/RFLX CULTURE, ROUTINE
Ketones, UA: NEGATIVE
Urobilinogen, Ur: 2 mg/dL — ABNORMAL HIGH (ref 0.2–1.0)

## 2022-10-19 LAB — URINE CULTURE, REFLEX

## 2022-10-21 LAB — URINE CULTURE, REFLEX: Organism ID, Bacteria: NO GROWTH

## 2022-10-21 LAB — MICROSCOPIC EXAMINATION
Casts: NONE SEEN /lpf
Epithelial Cells (non renal): NONE SEEN /hpf (ref 0–10)

## 2022-10-21 LAB — UA/M W/RFLX CULTURE, ROUTINE
Glucose, UA: NEGATIVE
Nitrite, UA: NEGATIVE
Protein,UA: NEGATIVE
RBC, UA: NEGATIVE
Specific Gravity, UA: 1.025 (ref 1.005–1.030)
pH, UA: 6 (ref 5.0–7.5)

## 2022-10-28 ENCOUNTER — Encounter: Payer: Self-pay | Admitting: Nurse Practitioner

## 2022-12-03 ENCOUNTER — Other Ambulatory Visit: Payer: Self-pay | Admitting: Nurse Practitioner

## 2022-12-03 DIAGNOSIS — I700 Atherosclerosis of aorta: Secondary | ICD-10-CM

## 2022-12-25 ENCOUNTER — Other Ambulatory Visit: Payer: Self-pay | Admitting: Nurse Practitioner

## 2022-12-25 DIAGNOSIS — E559 Vitamin D deficiency, unspecified: Secondary | ICD-10-CM

## 2023-02-19 ENCOUNTER — Encounter: Payer: Self-pay | Admitting: Oncology

## 2023-02-26 ENCOUNTER — Encounter: Payer: Self-pay | Admitting: Oncology

## 2023-02-26 ENCOUNTER — Ambulatory Visit: Payer: PRIVATE HEALTH INSURANCE | Admitting: Urology

## 2023-02-26 VITALS — BP 115/72 | HR 71 | Ht 67.0 in | Wt 158.4 lb

## 2023-02-26 DIAGNOSIS — Z8546 Personal history of malignant neoplasm of prostate: Secondary | ICD-10-CM

## 2023-02-26 DIAGNOSIS — C61 Malignant neoplasm of prostate: Secondary | ICD-10-CM

## 2023-02-26 DIAGNOSIS — N5231 Erectile dysfunction following radical prostatectomy: Secondary | ICD-10-CM

## 2023-02-26 MED ORDER — SILDENAFIL CITRATE 100 MG PO TABS: 100.0000 mg | ORAL_TABLET | Freq: Every day | ORAL | 3 refills | Status: DC | PRN

## 2023-02-26 MED ORDER — TADALAFIL 20 MG PO TABS: 20.0000 mg | ORAL_TABLET | Freq: Every day | ORAL | 0 refills | Status: DC | PRN

## 2023-02-26 NOTE — Progress Notes (Signed)
I,Amy L Pierron,acting as a scribe for Vanna Scotland, MD.,have documented all relevant documentation on the behalf of Vanna Scotland, MD,as directed by  Vanna Scotland, MD while in the presence of Vanna Scotland, MD.  02/26/2023 4:05 PM   Joni Fears Irish Lack 1960/09/02 [id number]  Referring provider: Sallyanne Kuster, NP [address] [place],  [place] [address]  Chief Complaint  Patient presents with   Prostate Cancer   Erectile Dysfunction    HPI: 62 year-old male with a personal history of unfavorable intermediate risk prostate cancer presents today for routine annual follow-up.  He is s/p radical robotic prostatectomy on 05/27/2017. He underwent a full nerve sparing procedure on the left and partial nerve sparing on the right.  Lymph node dissection was also performed.   His surgical pathology is consistent with Gleason 4+3 prostate cancer, large volume of Gleason 4 component greater than 60% of the right nodule.  No extracapsular extension, seminal vesicle invasion, bladder neck invasion.  Margins were negative.  Lymph nodes negative but the left-sided lymph node dissection was somewhat limited due to presence of mesh.  He also has a personal history of mild ED and uses Viagra as needed.   He is seeing medical oncology for thrombocytopenia possible related to alcohol suppression.   His most recent PSA was drawn today and awaiting final result. As of last year it was undetectable.   He mentions the Viagra isn't helping however, he has only been using 50 mg.   PMH: Past Medical History:  Diagnosis Date   B12 deficiency 04/21/2017   Cancer (HCC) 2018   Prostate Cancer   Diverticulosis    GERD (gastroesophageal reflux disease)    Glaucoma    Hypercholesteremia     Surgical History: Past Surgical History:  Procedure Laterality Date   COLONOSCOPY     COLONOSCOPY WITH PROPOFOL N/A 01/16/2022   Procedure: COLONOSCOPY WITH PROPOFOL;  Surgeon: Wyline Mood, MD;  Location: [facility]  ENDOSCOPY;  Service: Gastroenterology;  Laterality: N/A;   ESOPHAGOGASTRODUODENOSCOPY N/A 01/16/2022   Procedure: ESOPHAGOGASTRODUODENOSCOPY (EGD);  Surgeon: Wyline Mood, MD;  Location: [facility] ENDOSCOPY;  Service: Gastroenterology;  Laterality: N/A;   FOOT SURGERY Right 05/24/2021   HERNIA REPAIR     10 years ago, [facility]. umbilical hernia x 2   PELVIC LYMPH NODE DISSECTION Bilateral 05/27/2017   Procedure: PELVIC LYMPH NODE DISSECTION;  Surgeon: Vanna Scotland, MD;  Location: [facility] ORS;  Service: Urology;  Laterality: Bilateral;   ROBOT ASSISTED LAPAROSCOPIC RADICAL PROSTATECTOMY N/A 05/27/2017   Procedure: ROBOTIC ASSISTED LAPAROSCOPIC RADICAL PROSTATECTOMY;  Surgeon: Vanna Scotland, MD;  Location: [facility] ORS;  Service: Urology;  Laterality: N/A;    Home Medications:  Allergies as of 02/26/2023       Reactions   Sulfa Antibiotics Hives        Medication List        Accurate as of February 26, 2023  4:05 PM. If you have any questions, ask your nurse or doctor.          cyanocobalamin 1000 MCG tablet Commonly known as: VITAMIN B12 Take 1 tablet (1,000 mcg total) by mouth daily.   latanoprost 0.005 % ophthalmic solution Commonly known as: XALATAN INSTILL 1 DROP IN BOTH EYES AT BEDTIME   pravastatin 40 MG tablet Commonly known as: PRAVACHOL TAKE 1 TABLET(40 MG) BY MOUTH DAILY   sildenafil 100 MG tablet Commonly known as: VIAGRA Take 1 tablet (100 mg total) by mouth daily as needed for erectile dysfunction. What changed:  medication strength how  much to take Changed by: Vanna Scotland   tadalafil 20 MG tablet Commonly known as: CIALIS Take 1 tablet (20 mg total) by mouth daily as needed for erectile dysfunction. Started by: Vanna Scotland   Vitamin D (Ergocalciferol) 1.25 MG (50000 UNIT) Caps capsule Commonly known as: DRISDOL TAKE 1 CAPSULE BY MOUTH 1 TIME A WEEK        Allergies:  Allergies  Allergen Reactions   Sulfa Antibiotics Hives    Family  History: Family History  Problem Relation Age of Onset   Diabetes Mother    Breast cancer Mother    Arthritis Father    Breast cancer Sister     Social History:  reports that he quit smoking about 4 years ago. His smoking use included cigarettes. He started smoking about 38 years ago. He has a 25.5 pack-year smoking history. He has never used smokeless tobacco. He reports current alcohol use. He reports that he does not use drugs.   Physical Exam: BP 115/72   Pulse 71   Ht 5\' 7"  (1.702 m)   Wt 158 lb 6 oz (71.8 kg)   BMI 24.81 kg/m   Constitutional:  Alert and oriented, No acute distress. HEENT: [place] AT, moist mucus membranes.  Trachea midline, no masses. Neurologic: Grossly intact, no focal deficits, moving all 4 extremities. Psychiatric: Normal mood and affect.   Assessment & Plan:    1. Prostate cancer  - PSA drawn today and awaiting result. -Previously no evidence of disease, will continue to check PSA annually  2. Erectile dysfunction after radical prostatectomy  - Mentioned again it is okay to increase Viagra dose up to 100 mg, take at least 1 hour prior to sexual activity ideally on empty stomach. Also prescribed Cialis to try if the the increased dose is not effective.   - Will reach out and let us know how this works for him. Explained the next option to try would be injections.   Return in about 1 year (around 02/26/2024) for PSA.   [facility] [address] [place], [place] [address] [phone]

## 2023-02-27 LAB — PSA: Prostate Specific Ag, Serum: <0.1 ng/mL (ref 0.0–4.0)

## 2023-03-13 ENCOUNTER — Encounter: Payer: Self-pay | Admitting: Nurse Practitioner

## 2023-03-13 ENCOUNTER — Telehealth (INDEPENDENT_AMBULATORY_CARE_PROVIDER_SITE_OTHER): Payer: PRIVATE HEALTH INSURANCE | Admitting: Nurse Practitioner

## 2023-03-13 ENCOUNTER — Encounter: Payer: Self-pay | Admitting: Oncology

## 2023-03-13 VITALS — Ht 67.0 in | Wt 163.0 lb

## 2023-03-13 DIAGNOSIS — K219 Gastro-esophageal reflux disease without esophagitis: Secondary | ICD-10-CM | POA: Diagnosis not present

## 2023-03-13 DIAGNOSIS — R1314 Dysphagia, pharyngoesophageal phase: Secondary | ICD-10-CM

## 2023-03-13 MED ORDER — PANTOPRAZOLE SODIUM 40 MG PO TBEC
40.0000 mg | DELAYED_RELEASE_TABLET | Freq: Every day | ORAL | 4 refills | Status: DC
Start: 2023-03-13 — End: 2023-06-25

## 2023-03-13 NOTE — Progress Notes (Signed)
[facility] [place] [address] [place], [place] [address]  Internal MEDICINE  Telephone Visit  Patient Name: Donald Mendoza  [id number]  [id number]  Date of Service: 03/13/2023  I connected with the patient at 1715 by telephone and verified the patients identity using two identifiers.   I discussed the limitations, risks, security and privacy concerns of performing an evaluation and management service by telephone and the availability of in person appointments. I also discussed with the patient that there may be a patient responsible charge related to the service.  The patient expressed understanding and agrees to proceed.    Chief Complaint  Patient presents with   Telephone Assessment   Telephone Screen    Trouble swallowing    Sinusitis    Covid negative    Heartburn   Cough    HPI Donald Mendoza presents for a telehealth virtual visit for URI Cold at the place he works, caught a cold, then throat became sore, then he couldn't eat, now having trouble swallowing Taking robitussin for the cold   Current Medication: Outpatient Encounter Medications as of 03/13/2023  Medication Sig   cyanocobalamin (VITAMIN B12) 1000 MCG tablet Take 1 tablet (1,000 mcg total) by mouth daily.   latanoprost (XALATAN) 0.005 % ophthalmic solution INSTILL 1 DROP IN BOTH EYES AT BEDTIME   pantoprazole (PROTONIX) 40 MG tablet Take 1 tablet (40 mg total) by mouth daily before breakfast.   pravastatin (PRAVACHOL) 40 MG tablet TAKE 1 TABLET(40 MG) BY MOUTH DAILY   sildenafil (VIAGRA) 100 MG tablet Take 1 tablet (100 mg total) by mouth daily as needed for erectile dysfunction.   tadalafil (CIALIS) 20 MG tablet Take 1 tablet (20 mg total) by mouth daily as needed for erectile dysfunction.   [DISCONTINUED] Vitamin D, Ergocalciferol, (DRISDOL) 1.25 MG (50000 UNIT) CAPS capsule TAKE 1 CAPSULE BY MOUTH 1 TIME A WEEK   No facility-administered encounter medications on file as of 03/13/2023.    Surgical  History: Past Surgical History:  Procedure Laterality Date   COLONOSCOPY     COLONOSCOPY WITH PROPOFOL N/A 01/16/2022   Procedure: COLONOSCOPY WITH PROPOFOL;  Surgeon: Wyline Mood, MD;  Location: [facility] ENDOSCOPY;  Service: Gastroenterology;  Laterality: N/A;   ESOPHAGOGASTRODUODENOSCOPY N/A 01/16/2022   Procedure: ESOPHAGOGASTRODUODENOSCOPY (EGD);  Surgeon: Wyline Mood, MD;  Location: [facility] ENDOSCOPY;  Service: Gastroenterology;  Laterality: N/A;   FOOT SURGERY Right 05/24/2021   HERNIA REPAIR     10 years ago, [facility]. umbilical hernia x 2   PELVIC LYMPH NODE DISSECTION Bilateral 05/27/2017   Procedure: PELVIC LYMPH NODE DISSECTION;  Surgeon: Vanna Scotland, MD;  Location: [facility] ORS;  Service: Urology;  Laterality: Bilateral;   ROBOT ASSISTED LAPAROSCOPIC RADICAL PROSTATECTOMY N/A 05/27/2017   Procedure: ROBOTIC ASSISTED LAPAROSCOPIC RADICAL PROSTATECTOMY;  Surgeon: Vanna Scotland, MD;  Location: [facility] ORS;  Service: Urology;  Laterality: N/A;    Medical History: Past Medical History:  Diagnosis Date   B12 deficiency 04/21/2017   Cancer (HCC) 2018   Prostate Cancer   Diverticulosis    GERD (gastroesophageal reflux disease)    Glaucoma    Hypercholesteremia     Family History: Family History  Problem Relation Age of Onset   Diabetes Mother    Breast cancer Mother    Arthritis Father    Breast cancer Sister     Social History   Socioeconomic History   Marital status: Single    Spouse name: Not on file   Number of children: Not on file   Years  of education: Not on file   Highest education level: Not on file  Occupational History   Not on file  Tobacco Use   Smoking status: Former    Current packs/day: 0.00    Average packs/day: 0.8 packs/day for 34.0 years (25.5 ttl pk-yrs)    Types: Cigarettes    Start date: 06/15/1984    Quit date: 06/15/2018    Years since quitting: 4.8   Smokeless tobacco: Never  Vaping Use   Vaping status: Never Used  Substance and Sexual Activity    Alcohol use: Yes    Comment: socialy    Drug use: No   Sexual activity: Yes  Other Topics Concern   Not on file  Social History Narrative   Not on file   Social Determinants of Health   Financial Resource Strain: Not on file  Food Insecurity: Not on file  Transportation Needs: Not on file  Physical Activity: Not on file  Stress: Not on file  Social Connections: Not on file  Intimate Partner Violence: Not on file      Review of Systems  Constitutional: Negative.  Negative for fatigue.  HENT:  Positive for trouble swallowing (history of this issue before, needs GI referral).   Respiratory: Negative.  Negative for cough, chest tightness, shortness of breath and wheezing.   Cardiovascular: Negative.  Negative for chest pain and palpitations.  Gastrointestinal: Negative.   Neurological: Negative.  Negative for headaches.    Vital Signs: Ht 5\' 7"  (1.702 m)   Wt 163 lb (73.9 kg)   BMI 25.53 kg/m    Observation/Objective: He is alert and oriented. No acute distress noted.     Assessment/Plan: 1. Pharyngoesophageal dysphagia Referred to GI - Ambulatory referral to Gastroenterology  2. Gastroesophageal reflux disease without esophagitis Referred to GI, take pantoprazole as prescribed.  - Ambulatory referral to Gastroenterology - pantoprazole (PROTONIX) 40 MG tablet; Take 1 tablet (40 mg total) by mouth daily before breakfast.  Dispense: 30 tablet; Refill: 4   General Counseling: Danzig verbalizes understanding of the findings of today's phone visit and agrees with plan of treatment. I have discussed any further diagnostic evaluation that may be needed or ordered today. We also reviewed his medications today. he has been encouraged to call the office with any questions or concerns that should arise related to todays visit.  Return if symptoms worsen or fail to improve, for referred to GI.   Orders Placed This Encounter  Procedures   Ambulatory referral to  Gastroenterology    Meds ordered this encounter  Medications   pantoprazole (PROTONIX) 40 MG tablet    Sig: Take 1 tablet (40 mg total) by mouth daily before breakfast.    Dispense:  30 tablet    Refill:  4    Time spent:10 Minutes Time spent with patient included reviewing progress notes, labs, imaging studies, and discussing plan for follow up.  [place] Controlled Substance Database was reviewed by me for overdose risk score (ORS) if appropriate.  This patient was seen by Sallyanne Kuster, FNP-C in collaboration with Dr. Beverely Risen as a part of collaborative care agreement.  Kathleene Bergemann R. Tedd Sias, MSN, FNP-C Internal medicine

## 2023-03-18 ENCOUNTER — Other Ambulatory Visit: Payer: Self-pay | Admitting: Nurse Practitioner

## 2023-03-18 DIAGNOSIS — E559 Vitamin D deficiency, unspecified: Secondary | ICD-10-CM

## 2023-04-12 ENCOUNTER — Encounter: Payer: Self-pay | Admitting: Nurse Practitioner

## 2023-04-25 ENCOUNTER — Encounter: Payer: Self-pay | Admitting: Nurse Practitioner

## 2023-04-25 ENCOUNTER — Ambulatory Visit: Payer: PRIVATE HEALTH INSURANCE | Admitting: Nurse Practitioner

## 2023-04-25 VITALS — BP 114/76 | HR 88 | Temp 98.6°F | Resp 16 | Ht 67.0 in | Wt 164.0 lb

## 2023-04-25 DIAGNOSIS — E785 Hyperlipidemia, unspecified: Secondary | ICD-10-CM | POA: Diagnosis not present

## 2023-04-25 DIAGNOSIS — E1169 Type 2 diabetes mellitus with other specified complication: Secondary | ICD-10-CM | POA: Diagnosis not present

## 2023-04-25 DIAGNOSIS — E1165 Type 2 diabetes mellitus with hyperglycemia: Secondary | ICD-10-CM

## 2023-04-25 DIAGNOSIS — E1159 Type 2 diabetes mellitus with other circulatory complications: Secondary | ICD-10-CM | POA: Diagnosis not present

## 2023-04-25 DIAGNOSIS — I152 Hypertension secondary to endocrine disorders: Secondary | ICD-10-CM

## 2023-04-25 LAB — POCT GLYCOSYLATED HEMOGLOBIN (HGB A1C): Hemoglobin A1C: 6.2 % — AB (ref 4.0–5.6)

## 2023-04-25 NOTE — Progress Notes (Signed)
[facility] [address] [place], [place] [address]  Internal MEDICINE  Office Visit Note  Patient Name: Donald Mendoza  [id number]  [id number]  Date of Service: 04/25/2023  Chief Complaint  Patient presents with   Gastroesophageal Reflux   Hyperlipidemia   Follow-up    F/u blood pressure    HPI Donald Mendoza presents for a follow-up visit for diabetes, hypertension, GERD and high cholesterol Diabetes -- A1c is improving, diet controlled  Hypertension -- well controlled, not currently on any medication  Heartburn is improved, but having more gas and flatulence, still needs to see GI for further evaluation, has a approved referral, needs phone number to call their office to schedule. Taking pantoprazole  High cholesterol -- taking pravastatin     Current Medication: Outpatient Encounter Medications as of 04/25/2023  Medication Sig   cyanocobalamin (VITAMIN B12) 1000 MCG tablet Take 1 tablet (1,000 mcg total) by mouth daily.   latanoprost (XALATAN) 0.005 % ophthalmic solution INSTILL 1 DROP IN BOTH EYES AT BEDTIME   pantoprazole (PROTONIX) 40 MG tablet Take 1 tablet (40 mg total) by mouth daily before breakfast.   pravastatin (PRAVACHOL) 40 MG tablet TAKE 1 TABLET(40 MG) BY MOUTH DAILY   sildenafil (VIAGRA) 100 MG tablet Take 1 tablet (100 mg total) by mouth daily as needed for erectile dysfunction.   Vitamin D, Ergocalciferol, (DRISDOL) 1.25 MG (50000 UNIT) CAPS capsule TAKE 1 CAPSULE BY MOUTH 1 TIME A WEEK   [DISCONTINUED] tadalafil (CIALIS) 20 MG tablet Take 1 tablet (20 mg total) by mouth daily as needed for erectile dysfunction.   No facility-administered encounter medications on file as of 04/25/2023.    Surgical History: Past Surgical History:  Procedure Laterality Date   COLONOSCOPY     COLONOSCOPY WITH PROPOFOL N/A 01/16/2022   Procedure: COLONOSCOPY WITH PROPOFOL;  Surgeon: Wyline Mood, MD;  Location: [facility] ENDOSCOPY;  Service: Gastroenterology;  Laterality:  N/A;   ESOPHAGOGASTRODUODENOSCOPY N/A 01/16/2022   Procedure: ESOPHAGOGASTRODUODENOSCOPY (EGD);  Surgeon: Wyline Mood, MD;  Location: [facility] ENDOSCOPY;  Service: Gastroenterology;  Laterality: N/A;   FOOT SURGERY Right 05/24/2021   HERNIA REPAIR     10 years ago, [facility]. umbilical hernia x 2   PELVIC LYMPH NODE DISSECTION Bilateral 05/27/2017   Procedure: PELVIC LYMPH NODE DISSECTION;  Surgeon: Vanna Scotland, MD;  Location: [facility] ORS;  Service: Urology;  Laterality: Bilateral;   ROBOT ASSISTED LAPAROSCOPIC RADICAL PROSTATECTOMY N/A 05/27/2017   Procedure: ROBOTIC ASSISTED LAPAROSCOPIC RADICAL PROSTATECTOMY;  Surgeon: Vanna Scotland, MD;  Location: [facility] ORS;  Service: Urology;  Laterality: N/A;    Medical History: Past Medical History:  Diagnosis Date   B12 deficiency 04/21/2017   Cancer (HCC) 2018   Prostate Cancer   Diverticulosis    GERD (gastroesophageal reflux disease)    Glaucoma    Hypercholesteremia     Family History: Family History  Problem Relation Age of Onset   Diabetes Mother    Breast cancer Mother    Arthritis Father    Breast cancer Sister     Social History   Socioeconomic History   Marital status: Single    Spouse name: Not on file   Number of children: Not on file   Years of education: Not on file   Highest education level: Not on file  Occupational History   Not on file  Tobacco Use   Smoking status: Former    Current packs/day: 0.00    Average packs/day: 0.8 packs/day for 34.0 years (25.5 ttl pk-yrs)  Types: Cigarettes    Start date: 06/15/1984    Quit date: 06/15/2018    Years since quitting: 4.8   Smokeless tobacco: Never  Vaping Use   Vaping status: Never Used  Substance and Sexual Activity   Alcohol use: Yes    Comment: socialy    Drug use: No   Sexual activity: Yes  Other Topics Concern   Not on file  Social History Narrative   Not on file   Social Determinants of Health   Financial Resource Strain: Not on file  Food Insecurity:  Not on file  Transportation Needs: Not on file  Physical Activity: Not on file  Stress: Not on file  Social Connections: Not on file  Intimate Partner Violence: Not on file      Review of Systems  Constitutional: Negative.  Negative for fatigue.  HENT:  Positive for trouble swallowing (history of this issue before, GI referral).   Respiratory: Negative.  Negative for cough, chest tightness, shortness of breath and wheezing.   Cardiovascular: Negative.  Negative for chest pain and palpitations.  Gastrointestinal: Negative.        GERD, controlled Reports issues with flatulence  Neurological: Negative.  Negative for headaches.    Vital Signs: BP 114/76   Pulse 88   Temp 98.6 F (37 C)   Resp 16   Ht 5\' 7"  (1.702 m)   Wt 164 lb (74.4 kg)   SpO2 97%   BMI 25.69 kg/m    Physical Exam Vitals reviewed.  Constitutional:      General: He is not in acute distress.    Appearance: Normal appearance. He is normal weight. He is not ill-appearing.  HENT:     Head: Normocephalic and atraumatic.  Cardiovascular:     Rate and Rhythm: Normal rate and regular rhythm.  Pulmonary:     Effort: Pulmonary effort is normal. No respiratory distress.  Abdominal:     General: There is distension.  Neurological:     Mental Status: He is alert and oriented to person, place, and time.  Psychiatric:        Mood and Affect: Mood normal.        Behavior: Behavior normal.        Assessment/Plan: 1. Type 2 diabetes mellitus with other circulatory complication, without long-term current use of insulin (HCC) associated with hypertension and hyperlipidemia. Diet controlled.  Urine sent. A1c improving and within target range of less than 7.0. continue diet and lifestyle modifications as discussed.  - Urine Microalbumin w/creat. ratio - POCT glycosylated hemoglobin (Hb A1C)  2. Hypertension associated with type 2 diabetes mellitus (HCC) Stable without medication  3. Hyperlipidemia associated  with type 2 diabetes mellitus (HCC) Continue pravastatin as prescribed.    General Counseling: Donald Mendoza verbalizes understanding of the findings of todays visit and agrees with plan of treatment. I have discussed any further diagnostic evaluation that may be needed or ordered today. We also reviewed his medications today. he has been encouraged to call the office with any questions or concerns that should arise related to todays visit.    Orders Placed This Encounter  Procedures   Urine Microalbumin w/creat. ratio   POCT glycosylated hemoglobin (Hb A1C)    No orders of the defined types were placed in this encounter.   Return for previously scheduled, CPE, Donald Mendoza PCP on 10/21/23.   Total time spent:30 Minutes Time spent includes review of chart, medications, test results, and follow up plan with the patient.  [place] Controlled Substance Database was reviewed by me.  This patient was seen by Sallyanne Kuster, FNP-C in collaboration with Dr. Beverely Risen as a part of collaborative care agreement.   Merlina Marchena R. Tedd Sias, MSN, FNP-C Internal medicine

## 2023-04-27 LAB — MICROALBUMIN / CREATININE URINE RATIO
Creatinine, Urine: 216.8 mg/dL
Microalb/Creat Ratio: 17 mg/g{creat} (ref 0–29)
Microalbumin, Urine: 37.9 ug/mL

## 2023-05-23 ENCOUNTER — Telehealth: Payer: Self-pay | Admitting: Nurse Practitioner

## 2023-05-23 NOTE — Telephone Encounter (Signed)
Patient chart was updated

## 2023-06-10 ENCOUNTER — Other Ambulatory Visit: Payer: Self-pay | Admitting: Nurse Practitioner

## 2023-06-10 DIAGNOSIS — E559 Vitamin D deficiency, unspecified: Secondary | ICD-10-CM

## 2023-06-25 ENCOUNTER — Ambulatory Visit (INDEPENDENT_AMBULATORY_CARE_PROVIDER_SITE_OTHER): Payer: PRIVATE HEALTH INSURANCE | Admitting: Gastroenterology

## 2023-06-25 ENCOUNTER — Encounter: Payer: Self-pay | Admitting: Oncology

## 2023-06-25 DIAGNOSIS — K219 Gastro-esophageal reflux disease without esophagitis: Secondary | ICD-10-CM

## 2023-06-25 MED ORDER — PANTOPRAZOLE SODIUM 40 MG PO TBEC
40.0000 mg | DELAYED_RELEASE_TABLET | Freq: Every day | ORAL | 3 refills | Status: DC
Start: 2023-06-25 — End: 2023-08-30

## 2023-06-25 NOTE — Progress Notes (Signed)
 Donald Kung MD, MRCP(U.K) [address]  [address]  [place], [place] [address]  Main: [phone]  Fax: [phone]   Gastroenterology Consultation  Referring Provider:     Liana Fish, NP Primary Care Physician:  Liana Fish, NP Primary Gastroenterologist:  Dr. Ruel Mendoza  Reason for Consultation: Dysphagia        HPI:   Donald Mendoza is a 63 y.o. y/o male referred for consultation & management  by  Liana Fish, NP.    He has previously been seen in July 2023 for dysphagia.  At that point of time had been ongoing for a few months felt that food got stuck in his chest.  History of prostate cancer. 12/27/2021: EGD: Benign stenosis seen at the lower third of esophagus dilated to 18 mm.  Colonoscopy was performed on the same day for colon cancer screening was normal except diverticulosis of the sigmoid colon.   He says that after his dilation he did well did not have any problem swallowing at some point he stopped taking his PPI which she had recurrence of his dysphagia and heartburn and was recently restarted on Protonix  40 mg once a day and all his symptoms have resolved his only complaint is gas at this point of time he does admit that he rushes down his food during his lunch break because he only has a 30-minute window.  Denies any use of artificial sugars, sweeteners, chewing gum. Past Medical History:  Diagnosis Date   B12 deficiency 04/21/2017   Cancer (HCC) 2018   Prostate Cancer   Diverticulosis    GERD (gastroesophageal reflux disease)    Glaucoma    Hypercholesteremia     Past Surgical History:  Procedure Laterality Date   COLONOSCOPY     COLONOSCOPY WITH PROPOFOL  N/A 01/16/2022   Procedure: COLONOSCOPY WITH PROPOFOL ;  Surgeon: Mendoza Ruel, MD;  Location: [facility] ENDOSCOPY;  Service: Gastroenterology;  Laterality: N/A;   ESOPHAGOGASTRODUODENOSCOPY N/A 01/16/2022   Procedure: ESOPHAGOGASTRODUODENOSCOPY (EGD);  Surgeon: Mendoza Ruel, MD;  Location:  [facility] ENDOSCOPY;  Service: Gastroenterology;  Laterality: N/A;   FOOT SURGERY Right 05/24/2021   HERNIA REPAIR     10 years ago, [facility]. umbilical hernia x 2   PELVIC LYMPH NODE DISSECTION Bilateral 05/27/2017   Procedure: PELVIC LYMPH NODE DISSECTION;  Surgeon: Penne Knee, MD;  Location: [facility] ORS;  Service: Urology;  Laterality: Bilateral;   ROBOT ASSISTED LAPAROSCOPIC RADICAL PROSTATECTOMY N/A 05/27/2017   Procedure: ROBOTIC ASSISTED LAPAROSCOPIC RADICAL PROSTATECTOMY;  Surgeon: Penne Knee, MD;  Location: [facility] ORS;  Service: Urology;  Laterality: N/A;    Prior to Admission medications   Medication Sig Start Date End Date Taking? Authorizing Provider  cyanocobalamin  (VITAMIN B12) 1000 MCG tablet Take 1 tablet (1,000 mcg total) by mouth daily. 08/27/22   Babara Call, MD  latanoprost  (XALATAN ) 0.005 % ophthalmic solution INSTILL 1 DROP IN BOTH EYES AT BEDTIME 10/18/22   Abernathy, Alyssa, NP  pantoprazole  (PROTONIX ) 40 MG tablet Take 1 tablet (40 mg total) by mouth daily before breakfast. 03/13/23   Liana Fish, NP  pravastatin  (PRAVACHOL ) 40 MG tablet TAKE 1 TABLET(40 MG) BY MOUTH DAILY 12/03/22   Liana Fish, NP  sildenafil  (VIAGRA ) 100 MG tablet Take 1 tablet (100 mg total) by mouth daily as needed for erectile dysfunction. 02/26/23   Penne Knee, MD  Vitamin D , Ergocalciferol , (DRISDOL ) 1.25 MG (50000 UNIT) CAPS capsule TAKE 1 CAPSULE BY MOUTH 1 TIME A WEEK 06/10/23   Liana Fish, NP  Family History  Problem Relation Age of Onset   Diabetes Mother    Breast cancer Mother    Arthritis Father    Breast cancer Sister      Social History   Tobacco Use   Smoking status: Former    Current packs/day: 0.00    Average packs/day: 0.8 packs/day for 34.0 years (25.5 ttl pk-yrs)    Types: Cigarettes    Start date: 06/15/1984    Quit date: 06/15/2018    Years since quitting: 5.0   Smokeless tobacco: Never  Vaping Use   Vaping status: Never Used  Substance Use Topics    Alcohol use: Yes    Comment: socialy    Drug use: No    Allergies as of 06/25/2023 - Review Complete 06/25/2023  Allergen Reaction Noted   Sulfa antibiotics Hives 04/15/2015    Review of Systems:    All systems reviewed and negative except where noted in HPI.   Physical Exam:  There were no vitals taken for this visit. No LMP for male patient. Psych:  Alert and cooperative. Normal mood and affect. General:   Alert,  Well-developed, well-nourished, pleasant and cooperative in NAD Head:  Normocephalic and atraumatic. Eyes:  Sclera clear, no icterus.   Conjunctiva pink. Neurologic:  Alert and oriented x3;  grossly normal neurologically. Psych:  Alert and cooperative. Normal mood and affect.  Imaging Studies: No results found.  Assessment and Plan:   Donald Mendoza is a 63 y.o. y/o male here to see me as a follow-up for reflux.  History of esophageal stricture that has been dilated in the past.  Doing well from a dysphagia point of view.  Had recurrence of heartburn when he stopped his PPI and has had resolution of his symptoms after recommencing on pantoprazole  40 mg once a day discussed about lifestyle changes timing of meals using a wedge pillow.  Use of PPI.  Suggested to continue use the PPI since he has recurrence of symptoms when he stops it.  Since he has no dysphagia at this point of time I do not suggest any further workup if his symptoms recur he is free to come back and see us  I will give him information about reflux and a new prescription for his Protonix .  Follow up in as needed  Dr Donald Kung MD,MRCP(U.K)

## 2023-06-25 NOTE — Patient Instructions (Signed)
"  [place]"

## 2023-06-26 ENCOUNTER — Other Ambulatory Visit: Payer: Self-pay | Admitting: Nurse Practitioner

## 2023-06-26 DIAGNOSIS — I700 Atherosclerosis of aorta: Secondary | ICD-10-CM

## 2023-06-30 ENCOUNTER — Emergency Department
Admission: EM | Admit: 2023-06-30 | Discharge: 2023-06-30 | Disposition: A | Discharge status: Home / Self Care | Payer: PRIVATE HEALTH INSURANCE | Attending: Emergency Medicine | Admitting: Emergency Medicine

## 2023-06-30 ENCOUNTER — Encounter: Payer: Self-pay | Admitting: Oncology

## 2023-06-30 ENCOUNTER — Other Ambulatory Visit: Payer: Self-pay

## 2023-06-30 DIAGNOSIS — M545 Low back pain, unspecified: Secondary | ICD-10-CM | POA: Insufficient documentation

## 2023-06-30 DIAGNOSIS — R519 Headache, unspecified: Secondary | ICD-10-CM | POA: Diagnosis present

## 2023-06-30 DIAGNOSIS — Y92410 Unspecified street and highway as the place of occurrence of the external cause: Secondary | ICD-10-CM | POA: Insufficient documentation
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
## 2023-06-30 NOTE — ED Notes (Signed)
 Patient declined discharge vital signs.

## 2023-06-30 NOTE — Discharge Instructions (Signed)
 You can take Tylenol  and ibuprofen  as needed for pain symptoms.  Please follow-up with your primary care provider as needed.  Please return for any severe unrelenting headache, multiple episodes of nausea with vomiting, vision changes, weakness, or numbness in 1 particular area
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
## 2023-08-14 ENCOUNTER — Telehealth: Payer: Self-pay

## 2023-08-14 DIAGNOSIS — R197 Diarrhea, unspecified: Secondary | ICD-10-CM

## 2023-08-14 NOTE — Telephone Encounter (Signed)
 Patient stated he has diarrhea and has incontinence all through the day and night- he is soiling his clothes all through the day- 7-8 rolls toilet tissue a week-  also he states its all water- this is been going on for weeks now--he denies taking any laxatives- he seen Dr Tobi Bastos last month and was not having this- he is requesting to be seen today- denies nausea and vomiting- every time he eats it runs through him- watery diarrhea -he states he does not see any feces-just water-  Please contact patient-  [phone] has not tried anything but Pepto Bismol. Patient states he has lost weight. He is not taking any new medications. Pantoprazole is helping with his reflux.

## 2023-08-14 NOTE — Telephone Encounter (Signed)
 Called patient to let him know that Dr. Tobi Bastos is recommending blood work and stool tests. Patient agreed and had no further questions.

## 2023-08-18 ENCOUNTER — Other Ambulatory Visit: Payer: Self-pay | Admitting: Nurse Practitioner

## 2023-08-18 DIAGNOSIS — H4010X0 Unspecified open-angle glaucoma, stage unspecified: Secondary | ICD-10-CM

## 2023-08-18 LAB — CBC WITH DIFFERENTIAL/PLATELET
Basophils Absolute: 0 10*3/uL (ref 0.0–0.2)
Basos: 1 %
EOS (ABSOLUTE): 0.2 10*3/uL (ref 0.0–0.4)
Eos: 5 %
Hematocrit: 41.9 % (ref 37.5–51.0)
Hemoglobin: 13.6 g/dL (ref 13.0–17.7)
Immature Grans (Abs): 0 10*3/uL (ref 0.0–0.1)
Immature Granulocytes: 0 %
Lymphocytes Absolute: 1.6 10*3/uL (ref 0.7–3.1)
Lymphs: 36 %
MCH: 28.9 pg (ref 26.6–33.0)
MCHC: 32.5 g/dL (ref 31.5–35.7)
MCV: 89 fL (ref 79–97)
Monocytes Absolute: 0.3 10*3/uL (ref 0.1–0.9)
Monocytes: 7 %
Neutrophils Absolute: 2.3 10*3/uL (ref 1.4–7.0)
Neutrophils: 51 %
Platelets: 92 10*3/uL — CL (ref 150–450)
RBC: 4.71 x10E6/uL (ref 4.14–5.80)
RDW: 13.4 % (ref 11.6–15.4)
WBC: 4.6 10*3/uL (ref 3.4–10.8)

## 2023-08-18 LAB — GI PROFILE, STOOL, PCR

## 2023-08-18 LAB — COMPREHENSIVE METABOLIC PANEL WITH GFR
ALT: 19 IU/L (ref 0–44)
AST: 15 IU/L (ref 0–40)
Albumin: 4 g/dL (ref 3.8–4.9)
Alkaline Phosphatase: 136 IU/L — ABNORMAL HIGH (ref 44–121)
BUN/Creatinine Ratio: 11 (ref 10–24)
BUN: 9 mg/dL (ref 8–27)
Bilirubin Total: 0.4 mg/dL (ref 0.0–1.2)
CO2: 26 mmol/L (ref 20–29)
Calcium: 9.1 mg/dL (ref 8.6–10.2)
Chloride: 103 mmol/L (ref 96–106)
Creatinine, Ser: 0.85 mg/dL (ref 0.76–1.27)
Globulin, Total: 2.4 g/dL (ref 1.5–4.5)
Glucose: 131 mg/dL — ABNORMAL HIGH (ref 70–99)
Potassium: 3.8 mmol/L (ref 3.5–5.2)
Sodium: 142 mmol/L (ref 134–144)
Total Protein: 6.4 g/dL (ref 6.0–8.5)
eGFR: 99 mL/min/{1.73_m2} (ref >59)

## 2023-08-18 LAB — CALPROTECTIN, FECAL: Calprotectin, Fecal: 263 ug/g — ABNORMAL HIGH (ref 0–120)

## 2023-08-20 NOTE — Telephone Encounter (Signed)
 Called patient and let him know what Dr. Tobi Bastos recommended for him to know. He stated that the only thing that he would stop using is artificial sweeteners. I let the patient know that we would want for him to call us back in 2 weeks to let us know if his diarrhea had gotten better after stopping using artificial sweeteners. Patient also wanted to know if he is able to do his colonocopy soon since last week he received a letter that he was due for a colonoscopy. I then looked in his chart and I don't see where we had sent him a letter about doing one. The letter that was sent from you was to schedule a colooscopy until 12/2031. Please advise.

## 2023-08-21 LAB — C DIFFICILE, CYTOTOXIN B

## 2023-08-21 LAB — C DIFFICILE TOXINS A+B W/RFLX: C difficile Toxins A+B, EIA: NEGATIVE

## 2023-08-28 NOTE — Telephone Encounter (Signed)
 Called patient again and asked how he was feeling and if his diarrhea persisted. Patient appreciated the call and stated that he continues to have diarrhea but not as frequent as before. He stated that he is getting better by the day. I then asked him if he was interested in scheduling a colonoscopy with Dr. Tobi Bastos so he could rule out colitis. Patient stated to give him one more week and see how he continues to be and if he doesn't get any better than today, then he will call us and schedule a colonoscopy. I told him that if he chose to schedule his colonoscopy prior to please give [place] a call. Patient understood and had no further questions.

## 2023-08-29 ENCOUNTER — Other Ambulatory Visit: Payer: Self-pay | Admitting: Nurse Practitioner

## 2023-08-29 DIAGNOSIS — K219 Gastro-esophageal reflux disease without esophagitis: Secondary | ICD-10-CM

## 2023-09-11 ENCOUNTER — Encounter: Payer: Self-pay | Admitting: Oncology

## 2023-09-14 ENCOUNTER — Other Ambulatory Visit: Payer: Self-pay | Admitting: Nurse Practitioner

## 2023-09-14 DIAGNOSIS — E559 Vitamin D deficiency, unspecified: Secondary | ICD-10-CM

## 2023-09-16 ENCOUNTER — Telehealth: Payer: Self-pay

## 2023-09-16 DIAGNOSIS — R197 Diarrhea, unspecified: Secondary | ICD-10-CM

## 2023-09-16 NOTE — Telephone Encounter (Signed)
 Patient contacted office to request to schedule his colonoscopy.  Chart reviewed and he was advised that if his diarrhea continues to call back to schedule.  LOV 06/25/23 GERD.  Last call to the office in regard to this was on 08/28/23.  Pt is still having diarrhea and would like to schedule colonoscopy.  Informed him that I will message Norita Beauvais to let her know that he is still experiencing diarrhea and request to have colonoscopy.  Thanks, Moira Andrews CMA

## 2023-09-18 ENCOUNTER — Encounter: Payer: Self-pay | Admitting: Oncology

## 2023-09-18 ENCOUNTER — Other Ambulatory Visit: Payer: Self-pay

## 2023-09-18 DIAGNOSIS — R197 Diarrhea, unspecified: Secondary | ICD-10-CM

## 2023-09-18 MED ORDER — NA SULFATE-K SULFATE-MG SULF 17.5-3.13-1.6 GM/177ML PO SOLN: 1.0000 | Freq: Once | ORAL | 0 refills | Status: AC

## 2023-09-18 NOTE — Telephone Encounter (Signed)
**Note De-identified  [place] Obfuscation** Please advise 

## 2023-09-18 NOTE — Addendum Note (Signed)
 Addended by: Leellen Puller on: 09/18/2023 01:57 PM   Modules accepted: Orders

## 2023-09-18 NOTE — Telephone Encounter (Signed)
 Patient has been advised that Dr. Antony Baumgartner has advised to move forward with scheduling colonoscopy due to diarrhea.  Colonoscopy has been scheduled 10/03/23 at [facility] with Dr. Antony Baumgartner.  Instructions sent via mychart and printed to mail.  Prep sent to [facility] in [place].  Thanks,  [place], [place]

## 2023-09-25 ENCOUNTER — Ambulatory Visit: Payer: BC Managed Care – PPO | Admitting: Oncology

## 2023-09-25 ENCOUNTER — Other Ambulatory Visit: Payer: BC Managed Care – PPO

## 2023-09-26 ENCOUNTER — Encounter (HOSPITAL_COMMUNITY): Payer: Self-pay

## 2023-09-26 ENCOUNTER — Other Ambulatory Visit: Payer: Self-pay

## 2023-09-26 DIAGNOSIS — D696 Thrombocytopenia, unspecified: Secondary | ICD-10-CM

## 2023-09-27 ENCOUNTER — Inpatient Hospital Stay: Payer: PRIVATE HEALTH INSURANCE | Attending: Oncology

## 2023-09-27 ENCOUNTER — Encounter: Payer: Self-pay | Admitting: Oncology

## 2023-09-27 ENCOUNTER — Inpatient Hospital Stay (HOSPITAL_BASED_OUTPATIENT_CLINIC_OR_DEPARTMENT_OTHER): Payer: PRIVATE HEALTH INSURANCE | Admitting: Oncology

## 2023-09-27 VITALS — BP 98/63 | HR 66 | Temp 97.7°F | Resp 16 | Wt 153.0 lb

## 2023-09-27 DIAGNOSIS — E538 Deficiency of other specified B group vitamins: Secondary | ICD-10-CM | POA: Diagnosis not present

## 2023-09-27 DIAGNOSIS — D693 Immune thrombocytopenic purpura: Secondary | ICD-10-CM | POA: Diagnosis present

## 2023-09-27 DIAGNOSIS — R197 Diarrhea, unspecified: Secondary | ICD-10-CM | POA: Insufficient documentation

## 2023-09-27 DIAGNOSIS — C61 Malignant neoplasm of prostate: Secondary | ICD-10-CM | POA: Diagnosis not present

## 2023-09-27 DIAGNOSIS — K529 Noninfective gastroenteritis and colitis, unspecified: Secondary | ICD-10-CM | POA: Diagnosis not present

## 2023-09-27 DIAGNOSIS — D696 Thrombocytopenia, unspecified: Secondary | ICD-10-CM

## 2023-09-27 DIAGNOSIS — Z79899 Other long term (current) drug therapy: Secondary | ICD-10-CM | POA: Diagnosis not present

## 2023-09-27 DIAGNOSIS — Z87891 Personal history of nicotine dependence: Secondary | ICD-10-CM | POA: Diagnosis not present

## 2023-09-27 DIAGNOSIS — Z8546 Personal history of malignant neoplasm of prostate: Secondary | ICD-10-CM | POA: Diagnosis not present

## 2023-09-27 DIAGNOSIS — F1090 Alcohol use, unspecified, uncomplicated: Secondary | ICD-10-CM | POA: Diagnosis not present

## 2023-09-27 LAB — CBC WITH DIFFERENTIAL (CANCER CENTER ONLY)
Abs Immature Granulocytes: 0.01 10*3/uL (ref 0.00–0.07)
Basophils Absolute: 0 10*3/uL (ref 0.0–0.1)
Basophils Relative: 0 %
Eosinophils Absolute: 0.2 10*3/uL (ref 0.0–0.5)
Eosinophils Relative: 4 %
HCT: 39.3 % (ref 39.0–52.0)
Hemoglobin: 13.1 g/dL (ref 13.0–17.0)
Immature Granulocytes: 0 %
Lymphocytes Relative: 32 %
Lymphs Abs: 1.5 10*3/uL (ref 0.7–4.0)
MCH: 29.3 pg (ref 26.0–34.0)
MCHC: 33.3 g/dL (ref 30.0–36.0)
MCV: 87.9 fL (ref 80.0–100.0)
Monocytes Absolute: 0.3 10*3/uL (ref 0.1–1.0)
Monocytes Relative: 7 %
Neutro Abs: 2.7 10*3/uL (ref 1.7–7.7)
Neutrophils Relative %: 57 %
Platelet Count: 104 10*3/uL — ABNORMAL LOW (ref 150–400)
RBC: 4.47 MIL/uL (ref 4.22–5.81)
RDW: 13.4 % (ref 11.5–15.5)
Smear Review: DECREASED
WBC Count: 4.8 10*3/uL (ref 4.0–10.5)
nRBC: 0 % (ref 0.0–0.2)

## 2023-09-27 LAB — CMP (CANCER CENTER ONLY)
ALT: 18 U/L (ref 0–44)
AST: 18 U/L (ref 15–41)
Albumin: 3.8 g/dL (ref 3.5–5.0)
Alkaline Phosphatase: 111 U/L (ref 38–126)
Anion gap: 7 (ref 5–15)
BUN: 15 mg/dL (ref 8–23)
CO2: 25 mmol/L (ref 22–32)
Calcium: 8.6 mg/dL — ABNORMAL LOW (ref 8.9–10.3)
Chloride: 105 mmol/L (ref 98–111)
Creatinine: 0.75 mg/dL (ref 0.61–1.24)
GFR, Estimated: >60 mL/min (ref >60)
Glucose, Bld: 97 mg/dL (ref 70–99)
Potassium: 3.7 mmol/L (ref 3.5–5.1)
Sodium: 137 mmol/L (ref 135–145)
Total Bilirubin: 0.4 mg/dL (ref 0.0–1.2)
Total Protein: 6.7 g/dL (ref 6.5–8.1)

## 2023-09-27 NOTE — Assessment & Plan Note (Signed)
Alcohol cessation was discussed with patient. 

## 2023-09-27 NOTE — Assessment & Plan Note (Addendum)
#   Chronic thrombocytopenia:  ITP, with possible ETOH marrow suppression.  Labs are reviewed and discussed with patient. Stable platelet counts. Continue observation

## 2023-09-27 NOTE — Assessment & Plan Note (Signed)
 Prostate Cancer pT2(m) pN0 PSA 5.2 gleason score 4+3, grade group 3--Stage IIC, unfavorable intermediate risk group. status post prostatectomy BPLND .   Recent PSA < 0.01 he follows up with urology Dr. Ace Holder annually.

## 2023-09-27 NOTE — Assessment & Plan Note (Signed)
#   History of vitamin B12 deficiency, B12 level is n 500s Continue B12 1000 MCG daily.

## 2023-09-27 NOTE — Assessment & Plan Note (Signed)
 Follow-up with gastroenterology.  He is going to repeat colonoscopy.

## 2023-09-27 NOTE — Progress Notes (Signed)
 Hematology/Oncology Progress note [phone] [phone]       PURPOSE OF CONSULTATION:  Follow up of management of thrombocytopenia.  ASSESSMENT & PLAN:   Thrombocytopenia (HCC) # Chronic thrombocytopenia:  ITP, with possible ETOH marrow suppression.  Labs are reviewed and discussed with patient. Stable platelet counts. Continue observation   Alcohol use Alcohol cessation was discussed with patient.    B12 deficiency # History of vitamin B12 deficiency, B12 level is n 500s Continue B12 1000 MCG daily.   Prostate cancer (HCC) Prostate Cancer pT2(m) pN0 PSA 5.2 gleason score 4+3, grade group 3--Stage IIC, unfavorable intermediate risk group. status post prostatectomy BPLND .   Recent PSA < 0.01 he follows up with urology Dr. Ace Holder annually.    Chronic diarrhea Follow-up with gastroenterology.  He is going to repeat colonoscopy.   Orders Placed This Encounter  Procedures   CBC with Differential (Cancer Center Only)    Standing Status:   Future    Expected Date:   03/29/2024    Expiration Date:   09/26/2024   CMP (Cancer Center only)    Standing Status:   Future    Expected Date:   03/29/2024    Expiration Date:   09/26/2024   Vitamin B12    Standing Status:   Future    Expected Date:   03/29/2024    Expiration Date:   09/26/2024   Follow up in 6 months  All questions were answered. The patient knows to call the clinic with any problems, questions or concerns.  Donald Forbes, MD, PhD [facility] Hematology Oncology 09/27/2023   HISTORY OF PRESENTING ILLNESS:  Donald Mendoza is a  63 y.o.  male with PMH listed below who was referred to me for evaluation of thrombocytopenia. Patient was recently diagnosed with intermediate risk prostate cancer in the follows up with Dr. Ace Holder. Initially he was diagnosed with a low-risk prostate cancer Gleason score 3+3 in March 2018 when his PSA was 4. PSA continued to rise to 5.2 and patient had a repeat prostate  biopsy revealing Gleason score 4+3. Patient was scheduled for radical prostatectomy however was found to have thrombocytopenia. Referred to us  to evaluation for the low platelet count. Patient reports that he has been told that his platelet has been low in the past. He never received any treatment.  # S/p RALP with BPLND 05/27/2017, pathology showed Gleason 4+3, pT2 N0, NEGATIVE FOR EXTRAPROSTATIC EXTENSION, SEMINAL VESICLE INVASION, AND BLADDER NECK INVASION. - MARGINS NEGATIVE FOR CARCINOMA.  # Prostate cancer pT2N0, PSA <0.1, no adverse features, , no adjuvant treatment was offered.  # 11/15/2017 bone marrow biopsy showed normocellular bone marrow for age with trilineage hematopoiesis.  Bone marrow showed megakaryocytes with predominantly normal morphology.  Suggesting peripheral destruction, sequestration or consumption of platelets. Bone marrow cytogenetics were normal. Discussed with patient about his bone marrow biopsy results.  His chronic thrombocytopenia most likely secondary to ITP, possibly due to underlying autoimmune process.    INTERVAL HISTORY 63 y.o. male with history of prostate cancer s/p prostectomy, ITP presents for follow-up.  No bleeding events.  + he drinks alcohol, Dysphagia symptoms has improved.  Patient has experienced diarrhea for the past 45 minutes.  He follows with gastroenterology and there is plan for repeating colonoscopy next week.   Review of Systems  Constitutional:  Negative for chills, fever, malaise/fatigue and weight loss.  HENT:  Negative for sore throat.   Eyes:  Negative for redness.  Respiratory:  Negative for cough,  shortness of breath and wheezing.   Cardiovascular:  Negative for chest pain, palpitations and leg swelling.  Gastrointestinal:  Positive for diarrhea. Negative for abdominal pain, blood in stool, nausea and vomiting.       Dysphagia  Genitourinary:  Negative for dysuria.  Musculoskeletal:  Negative for myalgias.  Skin:  Negative for  rash.  Neurological:  Negative for dizziness, tingling and tremors.  Endo/Heme/Allergies:  Does not bruise/bleed easily.  Psychiatric/Behavioral:  Negative for hallucinations.     MEDICAL HISTORY:  Past Medical History:  Diagnosis Date   B12 deficiency 04/21/2017   Cancer (HCC) 2018   Prostate Cancer   Diverticulosis    GERD (gastroesophageal reflux disease)    Glaucoma    Hypercholesteremia     SURGICAL HISTORY: Past Surgical History:  Procedure Laterality Date   COLONOSCOPY     COLONOSCOPY WITH PROPOFOL  N/A 01/16/2022   Procedure: COLONOSCOPY WITH PROPOFOL ;  Surgeon: Luke Salaam, MD;  Location: [facility] ENDOSCOPY;  Service: Gastroenterology;  Laterality: N/A;   ESOPHAGOGASTRODUODENOSCOPY N/A 01/16/2022   Procedure: ESOPHAGOGASTRODUODENOSCOPY (EGD);  Surgeon: Luke Salaam, MD;  Location: [facility] ENDOSCOPY;  Service: Gastroenterology;  Laterality: N/A;   FOOT SURGERY Right 05/24/2021   HERNIA REPAIR     10 years ago, [facility]. umbilical hernia x 2   PELVIC LYMPH NODE DISSECTION Bilateral 05/27/2017   Procedure: PELVIC LYMPH NODE DISSECTION;  Surgeon: Dustin Gimenez, MD;  Location: [facility] ORS;  Service: Urology;  Laterality: Bilateral;   ROBOT ASSISTED LAPAROSCOPIC RADICAL PROSTATECTOMY N/A 05/27/2017   Procedure: ROBOTIC ASSISTED LAPAROSCOPIC RADICAL PROSTATECTOMY;  Surgeon: Dustin Gimenez, MD;  Location: [facility] ORS;  Service: Urology;  Laterality: N/A;    SOCIAL HISTORY: Social History   Socioeconomic History   Marital status: Single    Spouse name: Not on file   Number of children: Not on file   Years of education: Not on file   Highest education level: Not on file  Occupational History   Not on file  Tobacco Use   Smoking status: Former    Current packs/day: 0.00    Average packs/day: 0.8 packs/day for 34.0 years (25.5 ttl pk-yrs)    Types: Cigarettes    Start date: 06/15/1984    Quit date: 06/15/2018    Years since quitting: 5.2   Smokeless tobacco: Never  Vaping Use   Vaping  status: Never Used  Substance and Sexual Activity   Alcohol use: Yes    Comment: socialy    Drug use: No   Sexual activity: Yes  Other Topics Concern   Not on file  Social History Narrative   Not on file   Social Drivers of Health   Financial Resource Strain: Not on file  Food Insecurity: Not on file  Transportation Needs: Not on file  Physical Activity: Not on file  Stress: Not on file  Social Connections: Not on file  Intimate Partner Violence: Not on file    FAMILY HISTORY: Family History  Problem Relation Age of Onset   Diabetes Mother    Breast cancer Mother    Arthritis Father    Breast cancer Sister     ALLERGIES:  is allergic to sulfa antibiotics.  MEDICATIONS:  Current Outpatient Medications  Medication Sig Dispense Refill   cyanocobalamin  (VITAMIN B12) 1000 MCG tablet Take 1 tablet (1,000 mcg total) by mouth daily. 90 tablet 1   latanoprost  (XALATAN ) 0.005 % ophthalmic solution INSTILL 1 DROP IN BOTH EYES AT BEDTIME 5 mL 3   pantoprazole  (PROTONIX ) 40 MG tablet TAKE  1 TABLET(40 MG) BY MOUTH DAILY BEFORE BREAKFAST 30 tablet 2   pravastatin  (PRAVACHOL ) 40 MG tablet TAKE 1 TABLET BY MOUTH DAILY 90 tablet 1   sildenafil  (VIAGRA ) 100 MG tablet Take 1 tablet (100 mg total) by mouth daily as needed for erectile dysfunction. 30 tablet 3   Vitamin D , Ergocalciferol , (DRISDOL ) 1.25 MG (50000 UNIT) CAPS capsule TAKE 1 CAPSULE BY MOUTH 1 TIME A WEEK (Patient not taking: Reported on 09/27/2023) 12 capsule 0   No current facility-administered medications for this visit.     PHYSICAL EXAMINATION: ECOG PERFORMANCE STATUS: 0 - Asymptomatic Vitals:   09/27/23 1146  BP: 98/63  Pulse: 66  Resp: 16  Temp: 97.7 F (36.5 C)  SpO2: 100%   Filed Weights   09/27/23 1146  Weight: 153 lb (69.4 kg)    Physical Exam Constitutional:      General: He is not in acute distress.    Appearance: He is not diaphoretic.  HENT:     Head: Normocephalic and atraumatic.      Mouth/Throat:     Pharynx: No oropharyngeal exudate.  Eyes:     General: No scleral icterus.    Pupils: Pupils are equal, round, and reactive to light.  Cardiovascular:     Rate and Rhythm: Normal rate and regular rhythm.     Heart sounds: No murmur heard. Pulmonary:     Effort: Pulmonary effort is normal. No respiratory distress.     Breath sounds: No rales.  Chest:     Chest wall: No tenderness.  Abdominal:     General: There is no distension.     Palpations: Abdomen is soft.     Tenderness: There is no abdominal tenderness.  Musculoskeletal:        General: Normal range of motion.     Cervical back: Normal range of motion and neck supple.  Skin:    General: Skin is warm and dry.     Findings: No erythema.  Neurological:     Mental Status: He is alert and oriented to person, place, and time.     Cranial Nerves: No cranial nerve deficit.     Motor: No abnormal muscle tone.     Coordination: Coordination normal.  Psychiatric:        Mood and Affect: Affect normal.      LABORATORY DATA:  I have reviewed the data as listed     Latest Ref Rng & Units 09/27/2023   11:35 AM 08/16/2023    8:28 AM 09/25/2022   10:09 AM  CBC  WBC 4.0 - 10.5 K/uL 4.8  4.6  5.0   Hemoglobin 13.0 - 17.0 g/dL 16.1  09.6  04.5   Hematocrit 39.0 - 52.0 % 39.3  41.9  42.3   Platelets 150 - 400 K/uL 104  92  95       Latest Ref Rng & Units 09/27/2023   11:35 AM 08/16/2023    8:28 AM 09/25/2022   10:08 AM  CMP  Glucose 70 - 99 mg/dL 97  409  811   BUN 8 - 23 mg/dL 15  9  10    Creatinine 0.61 - 1.24 mg/dL 9.14  7.82  9.56   Sodium 135 - 145 mmol/L 137  142  138   Potassium 3.5 - 5.1 mmol/L 3.7  3.8  3.8   Chloride 98 - 111 mmol/L 105  103  105   CO2 22 - 32 mmol/L 25  26  27  Calcium 8.9 - 10.3 mg/dL 8.6  9.1  8.7   Total Protein 6.5 - 8.1 g/dL 6.7  6.4  6.6   Total Bilirubin 0.0 - 1.2 mg/dL 0.4  0.4  0.6   Alkaline Phos 38 - 126 U/L 111  136  94   AST 15 - 41 U/L 18  15  20    ALT 0 - 44 U/L 18   19  19

## 2023-10-03 ENCOUNTER — Encounter: Payer: Self-pay | Admitting: Gastroenterology

## 2023-10-03 ENCOUNTER — Ambulatory Visit: Payer: PRIVATE HEALTH INSURANCE | Admitting: Anesthesiology

## 2023-10-03 ENCOUNTER — Ambulatory Visit
Admission: RE | Admit: 2023-10-03 | Discharge: 2023-10-03 | Disposition: A | Discharge status: Home / Self Care | Payer: PRIVATE HEALTH INSURANCE | Attending: Gastroenterology | Admitting: Gastroenterology

## 2023-10-03 ENCOUNTER — Other Ambulatory Visit: Payer: Self-pay

## 2023-10-03 ENCOUNTER — Encounter
Admission: RE | Disposition: A | Discharge status: Home / Self Care | Payer: Self-pay | Source: Home / Self Care | Attending: Gastroenterology

## 2023-10-03 DIAGNOSIS — K529 Noninfective gastroenteritis and colitis, unspecified: Secondary | ICD-10-CM | POA: Diagnosis present

## 2023-10-03 DIAGNOSIS — K6389 Other specified diseases of intestine: Secondary | ICD-10-CM

## 2023-10-03 DIAGNOSIS — R197 Diarrhea, unspecified: Secondary | ICD-10-CM | POA: Diagnosis present

## 2023-10-03 DIAGNOSIS — K5730 Diverticulosis of large intestine without perforation or abscess without bleeding: Secondary | ICD-10-CM | POA: Diagnosis not present

## 2023-10-03 HISTORY — PX: COLONOSCOPY: SHX5424

## 2023-10-03 SURGERY — COLONOSCOPY
Anesthesia: General

## 2023-10-03 MED ORDER — SODIUM CHLORIDE 0.9 % IV SOLN: INTRAVENOUS | Status: DC

## 2023-10-03 MED ORDER — PROPOFOL 10 MG/ML IV BOLUS
INTRAVENOUS | Status: DC | PRN
  Administered 2023-10-03: 60 mg via INTRAVENOUS

## 2023-10-03 MED ORDER — STERILE WATER FOR IRRIGATION IR SOLN
Status: DC | PRN
  Administered 2023-10-03: 60 mL

## 2023-10-03 MED ORDER — PROPOFOL 500 MG/50ML IV EMUL
INTRAVENOUS | Status: DC | PRN
  Administered 2023-10-03: 150 ug/kg/min via INTRAVENOUS

## 2023-10-03 MED ORDER — LIDOCAINE HCL (CARDIAC) PF 100 MG/5ML IV SOSY
PREFILLED_SYRINGE | INTRAVENOUS | Status: DC | PRN
  Administered 2023-10-03: 50 mg via INTRAVENOUS

## 2023-10-03 NOTE — Transfer of Care (Signed)
 Immediate Anesthesia Transfer of Care Note  Patient: Donald Mendoza  Procedure(s) Performed: COLONOSCOPY  Patient Location: PACU and Endoscopy Unit  Anesthesia Type:General  Level of Consciousness: awake and drowsy  Airway & Oxygen Therapy: Patient Spontanous Breathing  Post-op Assessment: Report given to RN and Post -op Vital signs reviewed and stable  Post vital signs: Reviewed and stable  Last Vitals:  Vitals Value Taken Time  BP 86/74 (72)   Temp    Pulse 72 10/03/23 0915  Resp 14 10/03/23 0915  SpO2 99 % 10/03/23 0915  Vitals shown include unfiled device data.  Last Pain:  Vitals:   10/03/23 0818  TempSrc: Temporal  PainSc: 0-No pain         Complications: No notable events documented.

## 2023-10-03 NOTE — Anesthesia Postprocedure Evaluation (Signed)
 Anesthesia Post Note  Patient: Donald Mendoza  Procedure(s) Performed: COLONOSCOPY  Patient location during evaluation: Endoscopy Anesthesia Type: General Level of consciousness: awake and alert Pain management: pain level controlled Vital Signs Assessment: post-procedure vital signs reviewed and stable Respiratory status: spontaneous breathing, nonlabored ventilation, respiratory function stable and patient connected to nasal cannula oxygen Cardiovascular status: blood pressure returned to baseline and stable Postop Assessment: no apparent nausea or vomiting Anesthetic complications: no   No notable events documented.   Last Vitals:  Vitals:   10/03/23 0924 10/03/23 0934  BP:    Pulse:    Resp:    Temp:    SpO2: 100% 100%    Last Pain:  Vitals:   10/03/23 0934  TempSrc:   PainSc: 0-No pain                 Portia Brittle Lavanna Rog

## 2023-10-03 NOTE — Anesthesia Procedure Notes (Signed)
 Procedure Name: MAC Date/Time: 10/03/2023 8:50 AM  Performed by: Orin Birk, CRNAPre-anesthesia Checklist: Patient identified, Emergency Drugs available, Suction available and Patient being monitored Patient Re-evaluated:Patient Re-evaluated prior to induction

## 2023-10-03 NOTE — H&P (Signed)
 Luke Salaam, MD [address], [place], [place], [address] [address], [address], [place], [place], [address] Phone: [phone]  Fax: [phone]  Primary Care Physician:  Laurence Pons, NP   Pre-Procedure History & Physical: HPI:  Donald Mendoza is a 63 y.o. male is here for an colonoscopy.   Past Medical History:  Diagnosis Date   B12 deficiency 04/21/2017   Cancer (HCC) 2018   Prostate Cancer   Diverticulosis    GERD (gastroesophageal reflux disease)    Glaucoma    Hypercholesteremia     Past Surgical History:  Procedure Laterality Date   COLONOSCOPY     COLONOSCOPY WITH PROPOFOL  N/A 01/16/2022   Procedure: COLONOSCOPY WITH PROPOFOL ;  Surgeon: Luke Salaam, MD;  Location: [facility] ENDOSCOPY;  Service: Gastroenterology;  Laterality: N/A;   ESOPHAGOGASTRODUODENOSCOPY N/A 01/16/2022   Procedure: ESOPHAGOGASTRODUODENOSCOPY (EGD);  Surgeon: Luke Salaam, MD;  Location: [facility] ENDOSCOPY;  Service: Gastroenterology;  Laterality: N/A;   FOOT SURGERY Right 05/24/2021   HERNIA REPAIR     10 years ago, [facility]. umbilical hernia x 2   PELVIC LYMPH NODE DISSECTION Bilateral 05/27/2017   Procedure: PELVIC LYMPH NODE DISSECTION;  Surgeon: Dustin Gimenez, MD;  Location: [facility] ORS;  Service: Urology;  Laterality: Bilateral;   ROBOT ASSISTED LAPAROSCOPIC RADICAL PROSTATECTOMY N/A 05/27/2017   Procedure: ROBOTIC ASSISTED LAPAROSCOPIC RADICAL PROSTATECTOMY;  Surgeon: Dustin Gimenez, MD;  Location: [facility] ORS;  Service: Urology;  Laterality: N/A;    Prior to Admission medications   Medication Sig Start Date End Date Taking? Authorizing Provider  cyanocobalamin  (VITAMIN B12) 1000 MCG tablet Take 1 tablet (1,000 mcg total) by mouth daily. 08/27/22   [name], MD  latanoprost  (XALATAN ) 0.005 % ophthalmic solution INSTILL 1 DROP IN BOTH EYES AT BEDTIME 08/19/23   Abernathy, Alyssa, NP  pantoprazole  (PROTONIX ) 40 MG tablet TAKE 1 TABLET(40 MG) BY MOUTH DAILY BEFORE BREAKFAST 08/30/23    Abernathy, Alyssa, NP  pravastatin  (PRAVACHOL ) 40 MG tablet TAKE 1 TABLET BY MOUTH DAILY 06/26/23   Abernathy, Alyssa, NP  sildenafil  (VIAGRA ) 100 MG tablet Take 1 tablet (100 mg total) by mouth daily as needed for erectile dysfunction. 02/26/23   Dustin Gimenez, MD  Vitamin D , Ergocalciferol , (DRISDOL ) 1.25 MG (50000 UNIT) CAPS capsule TAKE 1 CAPSULE BY MOUTH 1 TIME A WEEK Patient not taking: Reported on 09/27/2023 06/10/23   Laurence Pons, NP    Allergies as of 09/18/2023 - Review Complete 06/30/2023  Allergen Reaction Noted   Sulfa antibiotics Hives 04/15/2015    Family History  Problem Relation Age of Onset   Diabetes Mother    Breast cancer Mother    Arthritis Father    Breast cancer Sister     Social History   Socioeconomic History   Marital status: Single    Spouse name: Not on file   Number of children: Not on file   Years of education: Not on file   Highest education level: Not on file  Occupational History   Not on file  Tobacco Use   Smoking status: Former    Current packs/day: 0.00    Average packs/day: 0.8 packs/day for 34.0 years (25.5 ttl pk-yrs)    Types: Cigarettes    Start date: 06/15/1984    Quit date: 06/15/2018    Years since quitting: 5.3   Smokeless tobacco: Never  Vaping Use   Vaping status: Never Used  Substance and Sexual Activity   Alcohol use: Yes    Comment: socialy  Drug use: No   Sexual activity: Yes  Other Topics Concern   Not on file  Social History Narrative   Not on file   Social Drivers of Health   Financial Resource Strain: Not on file  Food Insecurity: Not on file  Transportation Needs: Not on file  Physical Activity: Not on file  Stress: Not on file  Social Connections: Not on file  Intimate Partner Violence: Not on file    Review of Systems: See HPI, otherwise negative ROS  Physical Exam: There were no vitals taken for this visit. General:   Alert,  pleasant and cooperative in NAD Head:  Normocephalic and  atraumatic. Neck:  Supple; no masses or thyromegaly. Lungs:  Clear throughout to auscultation, normal respiratory effort.    Heart:  +S1, +S2, Regular rate and rhythm, No edema. Abdomen:  Soft, nontender and nondistended. Normal bowel sounds, without guarding, and without rebound.   Neurologic:  Alert and  oriented x4;  grossly normal neurologically.  Impression/Plan: TREDARIUS TENEYCK is here for an colonoscopy to be performed for diarrhea. Risks, benefits, limitations, and alternatives regarding  colonoscopy have been reviewed with the patient.  Questions have been answered.  All parties agreeable.   Luke Salaam, MD  10/03/2023, 8:04 AM

## 2023-10-03 NOTE — Op Note (Signed)
 [facility] Gastroenterology Patient Name: Donald Mendoza Procedure Date: 10/03/2023 8:41 AM MRN: [id number] Account #: [id number] Date of Birth: 1960/11/03 Admit Type: Outpatient Age: 63 Room: [facility] ENDO ROOM 3 Gender: Male Note Status: Finalized Instrument Name: Hyman Main [id number] Procedure:             Colonoscopy Indications:           Chronic diarrhea Providers:             Luke Salaam MD, MD Referring MD:          Laurence Pons (Referring MD) Medicines:             Monitored Anesthesia Care Complications:         No immediate complications. Procedure:             Pre-Anesthesia Assessment:                        - Prior to the procedure, a History and Physical was                         performed, and patient medications, allergies and                         sensitivities were reviewed. The patient's tolerance                         of previous anesthesia was reviewed.                        - The risks and benefits of the procedure and the                         sedation options and risks were discussed with the                         patient. All questions were answered and informed                         consent was obtained.                        - ASA Grade Assessment: II - A patient with mild                         systemic disease.                        After obtaining informed consent, the colonoscope was                         passed under direct vision. Throughout the procedure,                         the patient's blood pressure, pulse, and oxygen                         saturations were monitored continuously. The                         Colonoscope was introduced through the anus and  advanced to the the terminal ileum. The colonoscopy                         was performed with ease. The patient tolerated the                         procedure well. The quality of the bowel preparation                          was excellent. The terminal ileum, ileocecal valve,                         appendiceal orifice, and rectum and the ileocecal                         valve, appendiceal orifice, and rectum were                         photographed. Findings:      The perianal and digital rectal examinations were normal.      Multiple medium-mouthed diverticula were found in the left colon.      The terminal ileum appeared normal. Biopsies were taken with a cold       forceps for histology.      The colon (entire examined portion) appeared normal. Biopsies were taken       with a cold forceps for histology.      The exam was otherwise without abnormality on direct and retroflexion       views. Impression:            - Diverticulosis in the left colon.                        - The examined portion of the ileum was normal.                         Biopsied.                        - The entire examined colon is normal. Biopsied.                        - The examination was otherwise normal on direct and                         retroflexion views. Recommendation:        - Discharge patient to home (with escort).                        - Resume previous diet.                        - Continue present medications.                        - Await pathology results.                        - Repeat colonoscopy in 10 years for screening  purposes.                        - Return to GI office in 6 weeks. Procedure Code(s):     --- Professional ---                        [phone], Colonoscopy, flexible; with biopsy, single or                         multiple Diagnosis Code(s):     --- Professional ---                        K52.9, Noninfective gastroenteritis and colitis,                         unspecified                        K57.30, Diverticulosis of large intestine without                         perforation or abscess without bleeding CPT copyright 2022 American [employer] Association. All  rights reserved. The codes documented in this report are preliminary and upon coder review may  be revised to meet current compliance requirements. Luke Salaam, MD Luke Salaam MD, MD 10/03/2023 9:13:54 AM This report has been signed electronically. Number of Addenda: 0 Note Initiated On: 10/03/2023 8:41 AM Scope Withdrawal Time: 0 hours 9 minutes 58 seconds  Total Procedure Duration: 0 hours 12 minutes 13 seconds  Estimated Blood Loss:  Estimated blood loss: none.      [facility]

## 2023-10-03 NOTE — Anesthesia Preprocedure Evaluation (Signed)
 Anesthesia Evaluation  Patient identified by MRN, date of birth, ID band Patient awake    Reviewed: Allergy & Precautions, NPO status , Patient's Chart, lab work & pertinent test results  History of Anesthesia Complications Negative for: history of anesthetic complications  Airway Mallampati: III  TM Distance: <3 FB Neck ROM: full    Dental  (+) Chipped, Poor Dentition, Missing   Pulmonary neg shortness of breath, sleep apnea , former smoker   Pulmonary exam normal        Cardiovascular hypertension, Normal cardiovascular exam     Neuro/Psych  PSYCHIATRIC DISORDERS      negative neurological ROS     GI/Hepatic Neg liver ROS,GERD  Controlled,,  Endo/Other  diabetes    Renal/GU negative Renal ROS  negative genitourinary   Musculoskeletal   Abdominal   Peds  Hematology negative hematology ROS (+)   Anesthesia Other Findings Past Medical History: 04/21/2017: B12 deficiency 2018: Cancer (HCC)     Comment:  Prostate Cancer No date: Diverticulosis No date: GERD (gastroesophageal reflux disease) No date: Glaucoma No date: Hypercholesteremia  Past Surgical History: No date: COLONOSCOPY 01/16/2022: COLONOSCOPY WITH PROPOFOL ; N/A     Comment:  Procedure: COLONOSCOPY WITH PROPOFOL ;  Surgeon: Luke Salaam, MD;  Location: [facility] ENDOSCOPY;  Service:               Gastroenterology;  Laterality: N/A; 01/16/2022: ESOPHAGOGASTRODUODENOSCOPY; N/A     Comment:  Procedure: ESOPHAGOGASTRODUODENOSCOPY (EGD);  Surgeon:               Luke Salaam, MD;  Location: [facility] ENDOSCOPY;  Service:               Gastroenterology;  Laterality: N/A; 05/24/2021: FOOT SURGERY; Right No date: HERNIA REPAIR     Comment:  10 years ago, [facility]. umbilical hernia x 2 05/27/2017: PELVIC LYMPH NODE DISSECTION; Bilateral     Comment:  Procedure: PELVIC LYMPH NODE DISSECTION;  Surgeon:               Dustin Gimenez, MD;  Location: [facility] ORS;   Service:               Urology;  Laterality: Bilateral; 05/27/2017: ROBOT ASSISTED LAPAROSCOPIC RADICAL PROSTATECTOMY; N/A     Comment:  Procedure: ROBOTIC ASSISTED LAPAROSCOPIC RADICAL               PROSTATECTOMY;  Surgeon: Dustin Gimenez, MD;  Location:               [facility] ORS;  Service: Urology;  Laterality: N/A;     Reproductive/Obstetrics negative OB ROS                             Anesthesia Physical Anesthesia Plan  ASA: 2  Anesthesia Plan: General   Post-op Pain Management:    Induction: Intravenous  PONV Risk Score and Plan: Propofol  infusion and TIVA  Airway Management Planned: Natural Airway and Nasal Cannula  Additional Equipment:   Intra-op Plan:   Post-operative Plan:   Informed Consent: I have reviewed the patients History and Physical, chart, labs and discussed the procedure including the risks, benefits and alternatives for the proposed anesthesia with the patient or authorized representative who has indicated his/her understanding and acceptance.     Dental Advisory Given  Plan Discussed with: Anesthesiologist, CRNA and Surgeon  Anesthesia Plan Comments: (Patient consented  for risks of anesthesia including but not limited to:  - adverse reactions to medications - risk of airway placement if required - damage to eyes, teeth, lips or other oral mucosa - nerve damage due to positioning  - sore throat or hoarseness - Damage to heart, brain, nerves, lungs, other parts of body or loss of life  Patient voiced understanding and assent.)       Anesthesia Quick Evaluation

## 2023-10-04 ENCOUNTER — Ambulatory Visit: Payer: Self-pay | Admitting: Gastroenterology

## 2023-10-04 LAB — SURGICAL PATHOLOGY

## 2023-10-14 ENCOUNTER — Emergency Department: Payer: PRIVATE HEALTH INSURANCE

## 2023-10-14 ENCOUNTER — Emergency Department
Admission: EM | Admit: 2023-10-14 | Discharge: 2023-10-14 | Disposition: A | Discharge status: Home / Self Care | Payer: PRIVATE HEALTH INSURANCE | Attending: Emergency Medicine | Admitting: Emergency Medicine

## 2023-10-14 ENCOUNTER — Other Ambulatory Visit: Payer: Self-pay

## 2023-10-14 DIAGNOSIS — Z87891 Personal history of nicotine dependence: Secondary | ICD-10-CM | POA: Insufficient documentation

## 2023-10-14 DIAGNOSIS — M25511 Pain in right shoulder: Secondary | ICD-10-CM | POA: Diagnosis present

## 2023-10-14 DIAGNOSIS — E119 Type 2 diabetes mellitus without complications: Secondary | ICD-10-CM | POA: Diagnosis not present

## 2023-10-14 DIAGNOSIS — Z8546 Personal history of malignant neoplasm of prostate: Secondary | ICD-10-CM | POA: Insufficient documentation

## 2023-10-14 DIAGNOSIS — I10 Essential (primary) hypertension: Secondary | ICD-10-CM | POA: Diagnosis not present

## 2023-10-14 MED ORDER — NAPROXEN 500 MG PO TABS: 500.0000 mg | ORAL_TABLET | Freq: Two times a day (BID) | ORAL | 0 refills | Status: AC

## 2023-10-14 MED ORDER — KETOROLAC TROMETHAMINE 15 MG/ML IJ SOLN
15.0000 mg | Freq: Once | INTRAMUSCULAR | Status: AC
  Administered 2023-10-14: 15 mg via INTRAMUSCULAR
  Filled 2023-10-14: qty 1

## 2023-10-14 NOTE — ED Notes (Signed)
 See triage notes. Patient c/o right shoulder pain. Patient stated he woke up this morning being unable to move his right arm. Patient able to move the right arm to about mid chest. Patient denies injury.

## 2023-10-14 NOTE — ED Provider Notes (Signed)
 [facility] Provider Note    Event Date/Time   First MD Initiated Contact with Patient 10/14/23 1121     (approximate)   History   Shoulder Pain   HPI  Donald Mendoza is a 63 y.o. male who presents today for evaluation of right shoulder pain.  Patient reports that this began today.  He reports that he has pain with attempting to lift his arm past 90 degrees.  He denies any injuries.  No numbness or tingling.  No neck pain.  No chest pain or shortness of breath.  No exertional symptoms no nausea or diaphoresis.  No vomiting.  Patient Active Problem List   Diagnosis Date Noted   Diarrhea 09/27/2023   Hypertension associated with type 2 diabetes mellitus (HCC) 04/25/2023   Alcohol use 09/25/2022   Other dysphagia    Other fatigue 11/15/2020   Other hypersomnia 11/03/2020   OSA (obstructive sleep apnea) 10/13/2020   Elevated glucose level 08/14/2018   Urinary tract infection with hematuria 04/19/2018   Gastroesophageal reflux disease without esophagitis 04/19/2018   Uncontrolled type 2 diabetes mellitus with hyperglycemia (HCC) 12/29/2017   Cervicalgia 12/29/2017   Cigarette nicotine  dependence without complication 12/29/2017   Weight loss 11/25/2017   Thrombocytopenia (HCC) 11/25/2017   Type 2 diabetes mellitus with hyperglycemia (HCC) 09/02/2017   Colon cancer screening 09/02/2017   Dysuria 09/02/2017   Hyperlipidemia associated with type 2 diabetes mellitus (HCC) 09/02/2017   Prostate cancer (HCC) 05/26/2017   B12 deficiency 04/21/2017   Vitamin D  deficiency disease 03/14/2017   Elevated PSA 07/18/2016   Urinary hesitancy 07/18/2016          Physical Exam   Triage Vital Signs: ED Triage Vitals  Encounter Vitals Group     BP 10/14/23 1106 114/81     Systolic BP Percentile --      Diastolic BP Percentile --      Pulse Rate 10/14/23 1106 65     Resp 10/14/23 1106 20     Temp 10/14/23 1106 98.2 F (36.8 C)     Temp Source 10/14/23  1106 Oral     SpO2 10/14/23 1106 100 %     Weight 10/14/23 1103 164 lb (74.4 kg)     Height 10/14/23 1103 5\' 7"  (1.702 m)     Head Circumference --      Peak Flow --      Pain Score 10/14/23 1103 9     Pain Loc --      Pain Education --      Exclude from Growth Chart --     Most recent vital signs: Vitals:   10/14/23 1106  BP: 114/81  Pulse: 65  Resp: 20  Temp: 98.2 F (36.8 C)  SpO2: 100%    Physical Exam Vitals and nursing note reviewed.  Constitutional:      General: Awake and alert. No acute distress.    Appearance: Normal appearance. The patient is normal weight.  HENT:     Head: Normocephalic and atraumatic.     Mouth: Mucous membranes are moist.  Eyes:     General: PERRL. Normal EOMs        Right eye: No discharge.        Left eye: No discharge.     Conjunctiva/sclera: Conjunctivae normal.  Cardiovascular:     Rate and Rhythm: Normal rate and regular rhythm.     Pulses: Normal pulses.     Heart sounds: Normal heart sounds Pulmonary:  Effort: Pulmonary effort is normal. No respiratory distress.     Breath sounds: Normal breath sounds.  Abdominal:     Abdomen is soft. There is no abdominal tenderness. No rebound or guarding. No distention. Musculoskeletal:        General: No swelling. Normal range of motion.     Cervical back: Normal range of motion and neck supple. No midline cervical tenderness.  Negative Spurling and Lhermitte sign.  Normal strength and sensation of bilateral upper extremities.  Normal intrinsic muscle function of bilateral hands. Right shoulder: No obvious deformity, swelling, ecchymosis, or erythema No clavicular tenderness, mild AC joint tenderness Able to actively and passively forward flex and abduct at shoulder fully though pain after 90 degrees, negative drop arm test Pain with Obriens, SLAP, empty can, and lift off tests Normal internal and external rotation against resistance Pain with Hawkins and Neers Normal ROM at elbow and  wrist Normal resisted pronation and supination 2+ radial pulse Normal grip strength Normal intrinsic hand muscle function Skin:    General: Skin is warm and dry.     Capillary Refill: Capillary refill takes less than 2 seconds.     Findings: No rash.  Neurological:     Mental Status: The patient is awake and alert.      ED Results / Procedures / Treatments   Labs (all labs ordered are listed, but only abnormal results are displayed) Labs Reviewed - No data to display   EKG     RADIOLOGY I independently reviewed and interpreted imaging and agree with radiologists findings.     PROCEDURES:  Critical Care performed:   Procedures   MEDICATIONS ORDERED IN ED: Medications  ketorolac  (TORADOL ) 15 MG/ML injection 15 mg (15 mg Intramuscular Given 10/14/23 1151)     IMPRESSION / MDM / ASSESSMENT AND PLAN / ED COURSE  I reviewed the triage vital signs and the nursing notes.   Differential diagnosis includes, but is not limited to, calcific tendinitis, degenerative changes, rotator cuff injury, less likely fracture/dislocation.  Patient is awake and alert, hemodynamically stable and afebrile.  He has 2+ radial pulse, normal strength and sensation in bilateral upper extremities, normal grip strength, normal intrinsic muscle function of his hand.  No cervical spine tenderness, negative Spurling and Lhermitte sign, do not suspect cervical etiology.  He has very easily reproducible tenderness to palpation along his acromioclavicular joint pain with range of motion, specifically Hawkins and Neer's tests.  X-ray obtained reveals acromioclavicular degenerative changes without acute osseous abnormalities.  Patient was given Toradol  with improvement of his symptoms.  I recommended that he follow-up with orthopedics and he was given a prescription for naproxen as well.  We discussed return precautions and outpatient follow-up.  Patient understands and agrees with plan.  He was discharged  in stable condition.   Patient's presentation is most consistent with acute complicated illness / injury requiring diagnostic workup.  FINAL CLINICAL IMPRESSION(S) / ED DIAGNOSES   Final diagnoses:  Acute pain of right shoulder     Rx / DC Orders   ED Discharge Orders          Ordered    naproxen (NAPROSYN) 500 MG tablet  2 times daily with meals        10/14/23 1230             Note:  This document was prepared using Dragon voice recognition software and may include unintentional dictation errors.   Clarence Cogswell E, PA-C 10/14/23 1343  Viviano Ground, MD 10/14/23 [phone]

## 2023-10-14 NOTE — Discharge Instructions (Addendum)
 Your x-ray shows degenerative changes and bone spurs.  You may take the medication as prescribed to help with your pain.  Please return for any new, worsening, or change in symptoms or other concerns.

## 2023-10-14 NOTE — ED Triage Notes (Signed)
 Pt to ED for R shoulder pain that he woke up with. Pain is aching. States hurts too much to lift arm above shoulder. Pt took tylenol  PTA which helped a little. Denies injury. States sister has same problem and it's arthritis.

## 2023-10-21 ENCOUNTER — Encounter: Payer: Self-pay | Admitting: Nurse Practitioner

## 2023-10-21 ENCOUNTER — Ambulatory Visit (INDEPENDENT_AMBULATORY_CARE_PROVIDER_SITE_OTHER): Payer: PRIVATE HEALTH INSURANCE | Admitting: Nurse Practitioner

## 2023-10-21 VITALS — BP 104/76 | HR 74 | Temp 98.1°F | Resp 16 | Ht 67.0 in | Wt 149.6 lb

## 2023-10-21 DIAGNOSIS — Z0001 Encounter for general adult medical examination with abnormal findings: Secondary | ICD-10-CM

## 2023-10-21 DIAGNOSIS — D696 Thrombocytopenia, unspecified: Secondary | ICD-10-CM

## 2023-10-21 DIAGNOSIS — K219 Gastro-esophageal reflux disease without esophagitis: Secondary | ICD-10-CM

## 2023-10-21 DIAGNOSIS — I152 Hypertension secondary to endocrine disorders: Secondary | ICD-10-CM

## 2023-10-21 DIAGNOSIS — E538 Deficiency of other specified B group vitamins: Secondary | ICD-10-CM

## 2023-10-21 DIAGNOSIS — R634 Abnormal weight loss: Secondary | ICD-10-CM

## 2023-10-21 DIAGNOSIS — Z125 Encounter for screening for malignant neoplasm of prostate: Secondary | ICD-10-CM

## 2023-10-21 DIAGNOSIS — I700 Atherosclerosis of aorta: Secondary | ICD-10-CM | POA: Diagnosis not present

## 2023-10-21 DIAGNOSIS — E559 Vitamin D deficiency, unspecified: Secondary | ICD-10-CM

## 2023-10-21 DIAGNOSIS — E1159 Type 2 diabetes mellitus with other circulatory complications: Secondary | ICD-10-CM

## 2023-10-21 DIAGNOSIS — E1169 Type 2 diabetes mellitus with other specified complication: Secondary | ICD-10-CM

## 2023-10-21 DIAGNOSIS — R918 Other nonspecific abnormal finding of lung field: Secondary | ICD-10-CM

## 2023-10-21 MED ORDER — PRAVASTATIN SODIUM 40 MG PO TABS: 40.0000 mg | ORAL_TABLET | Freq: Every day | ORAL | 1 refills | Status: DC

## 2023-10-21 NOTE — Progress Notes (Signed)
 [facility] [address] [place], [place] [address]  Internal MEDICINE  Office Visit Note  Patient Name: Donald Mendoza  [id number]  [id number]  Date of Service: 10/21/2023  Chief Complaint  Patient presents with   Gastroesophageal Reflux   Hyperlipidemia   Annual Exam    HPI Donald Mendoza presents for an annual well visit and physical exam.  Well-appearing 63 y.o. male with  OSA, GERD, diabetes, high cholesterol and prostate cancer.  Routine CRC screening: done in 2023 Eye exam: sees eye doctor regularly foot exam:done  Labs: due for routine labs  New or worsening pain: right shoulder pain  Recent ED visit for acute right shoulder pain -- patient needs to call [facility] to follow up  Has had diarrhea for the past 3 months and has been losing weight Nothing was found on his colonoscopy, has upcoming follow up with GI doctor.  Reviewed recent imaging with patient   Current Medication: Outpatient Encounter Medications as of 10/21/2023  Medication Sig   cyanocobalamin  (VITAMIN B12) 1000 MCG tablet Take 1 tablet (1,000 mcg total) by mouth daily.   latanoprost  (XALATAN ) 0.005 % ophthalmic solution INSTILL 1 DROP IN BOTH EYES AT BEDTIME   naproxen  (NAPROSYN ) 500 MG tablet Take 1 tablet (500 mg total) by mouth 2 (two) times daily with a meal for 7 days.   pantoprazole  (PROTONIX ) 40 MG tablet TAKE 1 TABLET(40 MG) BY MOUTH DAILY BEFORE BREAKFAST   sildenafil  (VIAGRA ) 100 MG tablet Take 1 tablet (100 mg total) by mouth daily as needed for erectile dysfunction.   Vitamin D , Ergocalciferol , (DRISDOL ) 1.25 MG (50000 UNIT) CAPS capsule TAKE 1 CAPSULE BY MOUTH 1 TIME A WEEK   [DISCONTINUED] pravastatin  (PRAVACHOL ) 40 MG tablet TAKE 1 TABLET BY MOUTH DAILY   pravastatin  (PRAVACHOL ) 40 MG tablet Take 1 tablet (40 mg total) by mouth daily.   No facility-administered encounter medications on file as of 10/21/2023.    Surgical History: Past Surgical History:  Procedure Laterality Date    COLONOSCOPY     COLONOSCOPY N/A 10/03/2023   Procedure: COLONOSCOPY;  Surgeon: Luke Salaam, MD;  Location: [facility] ENDOSCOPY;  Service: Gastroenterology;  Laterality: N/A;   COLONOSCOPY WITH PROPOFOL  N/A 01/16/2022   Procedure: COLONOSCOPY WITH PROPOFOL ;  Surgeon: Luke Salaam, MD;  Location: [facility] ENDOSCOPY;  Service: Gastroenterology;  Laterality: N/A;   ESOPHAGOGASTRODUODENOSCOPY N/A 01/16/2022   Procedure: ESOPHAGOGASTRODUODENOSCOPY (EGD);  Surgeon: Luke Salaam, MD;  Location: [facility] ENDOSCOPY;  Service: Gastroenterology;  Laterality: N/A;   FOOT SURGERY Right 05/24/2021   HERNIA REPAIR     10 years ago, [facility]. umbilical hernia x 2   PELVIC LYMPH NODE DISSECTION Bilateral 05/27/2017   Procedure: PELVIC LYMPH NODE DISSECTION;  Surgeon: Dustin Gimenez, MD;  Location: [facility] ORS;  Service: Urology;  Laterality: Bilateral;   ROBOT ASSISTED LAPAROSCOPIC RADICAL PROSTATECTOMY N/A 05/27/2017   Procedure: ROBOTIC ASSISTED LAPAROSCOPIC RADICAL PROSTATECTOMY;  Surgeon: Dustin Gimenez, MD;  Location: [facility] ORS;  Service: Urology;  Laterality: N/A;    Medical History: Past Medical History:  Diagnosis Date   B12 deficiency 04/21/2017   Cancer (HCC) 2018   Prostate Cancer   Diverticulosis    GERD (gastroesophageal reflux disease)    Glaucoma    Hypercholesteremia     Family History: Family History  Problem Relation Age of Onset   Diabetes Mother    Breast cancer Mother    Arthritis Father    Breast cancer Sister     Social History   Socioeconomic History   Marital status:  Single    Spouse name: Not on file   Number of children: Not on file   Years of education: Not on file   Highest education level: Not on file  Occupational History   Not on file  Tobacco Use   Smoking status: Former    Current packs/day: 0.00    Average packs/day: 0.8 packs/day for 34.0 years (25.5 ttl pk-yrs)    Types: Cigarettes    Start date: 06/15/1984    Quit date: 06/15/2018    Years since quitting: 5.3    Smokeless tobacco: Never  Vaping Use   Vaping status: Never Used  Substance and Sexual Activity   Alcohol use: Yes    Comment: socialy    Drug use: No   Sexual activity: Yes  Other Topics Concern   Not on file  Social History Narrative   Not on file   Social Drivers of Health   Financial Resource Strain: Not on file  Food Insecurity: Not on file  Transportation Needs: Not on file  Physical Activity: Not on file  Stress: Not on file  Social Connections: Not on file  Intimate Partner Violence: Not on file      Review of Systems  Constitutional:  Negative for activity change, appetite change, chills, fatigue, fever and unexpected weight change.  HENT: Negative.  Negative for congestion, ear pain, rhinorrhea, sore throat and trouble swallowing.   Eyes: Negative.   Respiratory:  Negative for cough, chest tightness, shortness of breath and wheezing.   Cardiovascular: Negative.  Negative for chest pain.  Gastrointestinal: Negative.  Negative for abdominal pain, blood in stool, constipation, diarrhea, nausea and vomiting.  Endocrine: Negative.   Genitourinary: Negative.  Negative for difficulty urinating, dysuria, frequency, hematuria and urgency.  Musculoskeletal: Negative.  Negative for arthralgias, back pain, joint swelling, myalgias and neck pain.  Skin: Negative.  Negative for rash and wound.  Allergic/Immunologic: Negative.  Negative for immunocompromised state.  Neurological: Negative.  Negative for dizziness, seizures, numbness and headaches.  Hematological: Negative.   Psychiatric/Behavioral: Negative.  Negative for behavioral problems, self-injury and suicidal ideas. The patient is not nervous/anxious.     Vital Signs: BP 104/76   Pulse 74   Temp 98.1 F (36.7 C)   Resp 16   Ht 5\' 7"  (1.702 m)   Wt 149 lb 9.6 oz (67.9 kg)   SpO2 98%   BMI 23.43 kg/m    Physical Exam Vitals reviewed.  Constitutional:      General: He is awake. He is not in acute distress.     Appearance: Normal appearance. He is well-developed and normal weight. He is not ill-appearing or diaphoretic.  HENT:     Head: Normocephalic and atraumatic.     Right Ear: Tympanic membrane, ear canal and external ear normal.     Left Ear: Tympanic membrane, ear canal and external ear normal.     Nose: Nose normal. No congestion or rhinorrhea.     Mouth/Throat:     Lips: Pink.     Mouth: Mucous membranes are moist.     Pharynx: Oropharynx is clear. Uvula midline. No oropharyngeal exudate or posterior oropharyngeal erythema.  Eyes:     General: Lids are normal. Vision grossly intact. Gaze aligned appropriately. No scleral icterus.       Right eye: No discharge.        Left eye: No discharge.     Extraocular Movements: Extraocular movements intact.     Conjunctiva/sclera: Conjunctivae normal.  Pupils: Pupils are equal, round, and reactive to light.     Funduscopic exam:    Right eye: Red reflex present.        Left eye: Red reflex present. Neck:     Thyroid : No thyromegaly.     Vascular: No JVD.     Trachea: Trachea and phonation normal. No tracheal deviation.  Cardiovascular:     Rate and Rhythm: Normal rate and regular rhythm.     Heart sounds: Normal heart sounds, S1 normal and S2 normal. No murmur heard.    No friction rub. No gallop.  Pulmonary:     Effort: Pulmonary effort is normal. No accessory muscle usage or respiratory distress.     Breath sounds: Normal breath sounds and air entry. No stridor. No wheezing or rales.  Chest:     Chest wall: No tenderness.  Abdominal:     General: Bowel sounds are normal. There is no distension.     Palpations: Abdomen is soft. There is no mass.     Tenderness: There is no abdominal tenderness. There is no guarding or rebound.  Musculoskeletal:        General: No tenderness or deformity. Normal range of motion.     Cervical back: Normal range of motion and neck supple.     Right lower leg: No edema.     Left lower leg: No edema.   Lymphadenopathy:     Cervical: No cervical adenopathy.  Skin:    General: Skin is warm and dry.     Capillary Refill: Capillary refill takes less than 2 seconds.     Coloration: Skin is not pale.     Findings: No erythema or rash.  Neurological:     Mental Status: He is alert and oriented to person, place, and time.     Cranial Nerves: No cranial nerve deficit.     Motor: No abnormal muscle tone.     Coordination: Coordination normal.     Deep Tendon Reflexes: Reflexes are normal and symmetric.  Psychiatric:        Behavior: Behavior normal. Behavior is cooperative.        Thought Content: Thought content normal.        Judgment: Judgment normal.        Assessment/Plan: 1. Encounter for routine adult health examination with abnormal findings (Primary) Age-appropriate preventive screenings and vaccinations discussed, annual physical exam completed. Routine labs for health maintenance ordered, see below. PHM updated.   - CBC with Differential/Platelet - CMP14+EGFR - Lipid Profile - B12 and Folate Panel - TSH+T4F+T3Free+ThyAbs+TPO+VD25 - HIV antibody (with reflex) - Iron, TIBC and Ferritin Panel  2. Unexplained weight loss Routine labs ordered  - CBC with Differential/Platelet - CMP14+EGFR - Lipid Profile - B12 and Folate Panel - TSH+T4F+T3Free+ThyAbs+TPO+VD25 - HIV antibody (with reflex) - Iron, TIBC and Ferritin Panel  3. Type 2 diabetes mellitus with other circulatory complication, without long-term current use of insulin (HCC) Routine labs ordered  - CBC with Differential/Platelet - CMP14+EGFR - Lipid Profile - B12 and Folate Panel - TSH+T4F+T3Free+ThyAbs+TPO+VD25 - HIV antibody (with reflex) - Iron, TIBC and Ferritin Panel  4. Hypertension associated with type 2 diabetes mellitus (HCC) Routine labs ordered - CBC with Differential/Platelet - CMP14+EGFR - Lipid Profile - B12 and Folate Panel - TSH+T4F+T3Free+ThyAbs+TPO+VD25 - HIV antibody (with  reflex) - Iron, TIBC and Ferritin Panel  5. Thrombocytopenia (HCC) Routine labs ordered  - CBC with Differential/Platelet - B12 and Folate Panel - Iron, TIBC and Ferritin  Panel  6. Aortic atherosclerosis (HCC) Continue pravastatin  as prescribed. Routine labs ordered - CMP14+EGFR - Lipid Profile - TSH+T4F+T3Free+ThyAbs+TPO+VD25 - HIV antibody (with reflex) - pravastatin  (PRAVACHOL ) 40 MG tablet; Take 1 tablet (40 mg total) by mouth daily.  Dispense: 90 tablet; Refill: 1  7. B12 deficiency Routine labs ordered  - CBC with Differential/Platelet - B12 and Folate Panel - Iron, TIBC and Ferritin Panel  8. Vitamin D  deficiency Routine labs ordered  - TSH+T4F+T3Free+ThyAbs+TPO+VD25  9. Multiple lung nodules on CT CT chest low dose ordered  - CT CHEST LUNG CA SCREEN LOW DOSE W/O CM; Future  10. Screening for prostate cancer PSA lab ordered  - PSA Total (Reflex To Free)     General Counseling: Sade verbalizes understanding of the findings of todays visit and agrees with plan of treatment. I have discussed any further diagnostic evaluation that may be needed or ordered today. We also reviewed his medications today. he has been encouraged to call the office with any questions or concerns that should arise related to todays visit.    Orders Placed This Encounter  Procedures   CT CHEST LUNG CA SCREEN LOW DOSE W/O CM   CBC with Differential/Platelet   CMP14+EGFR   Lipid Profile   B12 and Folate Panel   TSH+T4F+T3Free+ThyAbs+TPO+VD25   HIV antibody (with reflex)   Iron, TIBC and Ferritin Panel   PSA Total (Reflex To Free)    Meds ordered this encounter  Medications   pravastatin  (PRAVACHOL ) 40 MG tablet    Sig: Take 1 tablet (40 mg total) by mouth daily.    Dispense:  90 tablet    Refill:  1    **Patient requests 90 days supply**    Return in about 4 weeks (around 11/18/2023) for F/U, Labs, Aleigh Grunden PCP.   Total time spent:30 Minutes Time spent includes review of  chart, medications, test results, and follow up plan with the patient.    Controlled Substance Database was reviewed by me.  This patient was seen by Laurence Pons, FNP-C in collaboration with Dr. Verneta Gone as a part of collaborative care agreement.  Khai Arrona R. Bobbi Burow, MSN, FNP-C Internal medicine

## 2023-10-22 ENCOUNTER — Encounter: Payer: Self-pay | Admitting: Nurse Practitioner

## 2023-10-22 DIAGNOSIS — I700 Atherosclerosis of aorta: Secondary | ICD-10-CM | POA: Insufficient documentation

## 2023-10-24 ENCOUNTER — Ambulatory Visit: Payer: PRIVATE HEALTH INSURANCE | Admitting: Nurse Practitioner

## 2023-10-28 ENCOUNTER — Emergency Department
Admission: EM | Admit: 2023-10-28 | Discharge: 2023-10-28 | Disposition: A | Discharge status: Home / Self Care | Payer: PRIVATE HEALTH INSURANCE | Attending: Emergency Medicine | Admitting: Emergency Medicine

## 2023-10-28 ENCOUNTER — Other Ambulatory Visit: Payer: Self-pay

## 2023-10-28 DIAGNOSIS — R197 Diarrhea, unspecified: Secondary | ICD-10-CM | POA: Diagnosis not present

## 2023-10-28 LAB — LIPASE, BLOOD: Lipase: 41 U/L (ref 11–51)

## 2023-10-28 LAB — COMPREHENSIVE METABOLIC PANEL WITH GFR
ALT: 20 U/L (ref 0–44)
AST: 18 U/L (ref 15–41)
Albumin: 3.8 g/dL (ref 3.5–5.0)
Alkaline Phosphatase: 92 U/L (ref 38–126)
Anion gap: 9 (ref 5–15)
BUN: 13 mg/dL (ref 8–23)
CO2: 23 mmol/L (ref 22–32)
Calcium: 8.6 mg/dL — ABNORMAL LOW (ref 8.9–10.3)
Chloride: 107 mmol/L (ref 98–111)
Creatinine, Ser: 0.78 mg/dL (ref 0.61–1.24)
GFR, Estimated: >60 mL/min (ref >60)
Glucose, Bld: 134 mg/dL — ABNORMAL HIGH (ref 70–99)
Potassium: 3.6 mmol/L (ref 3.5–5.1)
Sodium: 139 mmol/L (ref 135–145)
Total Bilirubin: 0.5 mg/dL (ref 0.0–1.2)
Total Protein: 6.9 g/dL (ref 6.5–8.1)

## 2023-10-28 LAB — GASTROINTESTINAL PANEL BY PCR, STOOL (REPLACES STOOL CULTURE)

## 2023-10-28 LAB — URINALYSIS, ROUTINE W REFLEX MICROSCOPIC
Bacteria, UA: NONE SEEN
Bilirubin Urine: NEGATIVE
Glucose, UA: NEGATIVE mg/dL
Hgb urine dipstick: NEGATIVE
Ketones, ur: NEGATIVE mg/dL
Leukocytes,Ua: NEGATIVE
Nitrite: NEGATIVE
Protein, ur: 30 mg/dL — AB
Specific Gravity, Urine: 1.033 — ABNORMAL HIGH (ref 1.005–1.030)
Squamous Epithelial / HPF: 0 /HPF (ref 0–5)
WBC, UA: 0 WBC/hpf (ref 0–5)
pH: 5 (ref 5.0–8.0)

## 2023-10-28 LAB — CBC
HCT: 41.3 % (ref 39.0–52.0)
Hemoglobin: 13.7 g/dL (ref 13.0–17.0)
MCH: 29.3 pg (ref 26.0–34.0)
MCHC: 33.2 g/dL (ref 30.0–36.0)
MCV: 88.2 fL (ref 80.0–100.0)
Platelets: 108 10*3/uL — ABNORMAL LOW (ref 150–400)
RBC: 4.68 MIL/uL (ref 4.22–5.81)
RDW: 13.9 % (ref 11.5–15.5)
WBC: 5.7 10*3/uL (ref 4.0–10.5)
nRBC: 0 % (ref 0.0–0.2)

## 2023-10-28 LAB — C DIFFICILE QUICK SCREEN W PCR REFLEX
C Diff antigen: NEGATIVE
C Diff interpretation: NOT DETECTED
C Diff toxin: NEGATIVE

## 2023-10-28 MED ORDER — LOPERAMIDE HCL 2 MG PO TABS: 2.0000 mg | ORAL_TABLET | Freq: Four times a day (QID) | ORAL | 0 refills | Status: DC | PRN

## 2023-10-28 MED ORDER — LOPERAMIDE HCL 2 MG PO CAPS
4.0000 mg | ORAL_CAPSULE | Freq: Once | ORAL | Status: AC
  Administered 2023-10-28: 4 mg via ORAL
  Filled 2023-10-28: qty 2

## 2023-10-28 NOTE — ED Provider Notes (Signed)
 [facility] Emergency Department Provider Note     Event Date/Time   First MD Initiated Contact with Patient 10/28/23 1820     (approximate)   History   Diarrhea   HPI  Donald Mendoza is a 63 y.o. male with a history of diverticulosis presents to the ED with complaint of persistent milky, loose stools occurring since a colonoscopy on 05/15.  Describes the diarrhea as thin, watery and occurring up to 4-5 times a day.  Symptoms briefly resolved but then recurred shortly after.  Denies rectal bleeding, vomiting or dark tarry stools.  Follows with gastroenterology at [facility] clinic for ongoing GI care.  Chart reviewed.  Patient had colonoscopy performed on 05/15 with Dr. Antony Baumgartner for chronic diarrhea.  Noted to diverticulitis in the left colon without perforation or abscess and bleeding.  Appeared to be a normal colonoscopy otherwise.   Physical Exam   Triage Vital Signs: ED Triage Vitals  Encounter Vitals Group     BP 10/28/23 1602 109/70     Systolic BP Percentile --      Diastolic BP Percentile --      Pulse Rate 10/28/23 1559 85     Resp 10/28/23 1559 20     Temp 10/28/23 1559 98.7 F (37.1 C)     Temp Source 10/28/23 1559 Oral     SpO2 10/28/23 1559 96 %     Weight 10/28/23 1558 154 lb (69.9 kg)     Height 10/28/23 1558 5\' 7"  (1.702 m)     Head Circumference --      Peak Flow --      Pain Score 10/28/23 1559 0     Pain Loc --      Pain Education --      Exclude from Growth Chart --     Most recent vital signs: Vitals:   10/28/23 1602 10/28/23 2122  BP: 109/70 109/81  Pulse:  80  Resp:  18  Temp:    SpO2:  98%   General: Well appearing. Alert and oriented. INAD.  Skin:  Warm, dry and intact. No rashes or lesions noted.     Head:  NCAT.  Eyes:  PERRLA. EOMI.  CV:  Good peripheral perfusion.  RESP:  Normal effort.  ABD:  No distention.  NBS x 4. Soft, Non tender.  Mild tenderness to LLQ without guarding.     ED Results /  Procedures / Treatments   Labs (all labs ordered are listed, but only abnormal results are displayed) Labs Reviewed  COMPREHENSIVE METABOLIC PANEL WITH GFR - Abnormal; Notable for the following components:      Result Value   Glucose, Bld 134 (*)    Calcium 8.6 (*)    All other components within normal limits  CBC - Abnormal; Notable for the following components:   Platelets 108 (*)    All other components within normal limits  URINALYSIS, ROUTINE W REFLEX MICROSCOPIC - Abnormal; Notable for the following components:   Color, Urine AMBER (*)    APPearance CLEAR (*)    Specific Gravity, Urine 1.033 (*)    Protein, ur 30 (*)    All other components within normal limits  GASTROINTESTINAL PANEL BY PCR, STOOL (REPLACES STOOL CULTURE)  C DIFFICILE QUICK SCREEN W PCR REFLEX    LIPASE, BLOOD   No results found.  PROCEDURES:  Critical Care performed: No  Procedures   MEDICATIONS ORDERED IN ED: Medications  loperamide (IMODIUM) capsule 4 mg (  4 mg Oral Given 10/28/23 2121)     IMPRESSION / MDM / ASSESSMENT AND PLAN / ED COURSE  I reviewed the triage vital signs and the nursing notes.                              Clinical Course as of 10/28/23 2252  Mon Oct 28, 2023  1610 Recommendation from gastroenterology - Dr. Antony Baumgartner who performed patient's colonoscopy on 05/15 to obtain stool test and follow up in GI office this week.  [MH]    Clinical Course User Index [MH] Alvaro Augusta A, PA-C    63 y.o. male presents to the emergency department for evaluation and treatment of chronic diarrhea. See HPI for further details.   Differential diagnosis includes, but is not limited to functional diarrhea, post colonoscopy eval for imbalance, gastroenteritis, diverticulosis  Patient's presentation is most consistent with acute complicated illness / injury requiring diagnostic workup.  No red flag features such as rectal bleeding, fever or abdominal tenderness on exam to indicate imaging.   Symptoms are chronic but nonacute in severity.  No signs of dehydration or hemodynamic instability.  Labs are reassuring.  Urinalysis reassuring.  Gastrointestinal panel with no detection.  C. difficile negative.  Consulted with Dr. Antony Baumgartner of gastroenterology who will see patient in office sometime this week.  Recommended stool study.  Patient is encouraged to follow-up with Dr. Antony Baumgartner for further evaluation.  Imodium provided in ED 4 mg.  Will prescribe Imodium.  Advised patient to only take after loose stool BM.  He verbalized understanding.  He is in stable condition for discharge home.  Encourage oral rehydration.  ED return precaution discussed.  FINAL CLINICAL IMPRESSION(S) / ED DIAGNOSES   Final diagnoses:  Diarrhea, unspecified type     Rx / DC Orders   ED Discharge Orders          Ordered    loperamide (IMODIUM A-D) 2 MG tablet  4 times daily PRN        10/28/23 2106           Note:  This document was prepared using Dragon voice recognition software and may include unintentional dictation errors.    Phyllis Breeze, Kerington Hildebrant A, PA-C 10/28/23 2252    Kandee Orion, MD 10/28/23 [phone]

## 2023-10-28 NOTE — ED Triage Notes (Signed)
 Pt to ED for diarrhea, "milky" stool since colonoscopy that was 5/15. Sometimes 4-5 BM per day, thin. Denies rectal bleeding. Pt has steady gait. Skin dry. Feels fatigued.

## 2023-10-28 NOTE — Discharge Instructions (Signed)
 You were evaluated in the ED for diarrhea following a colonoscopy.  Your blood work is reassuring.  Your urine is normal.  Your stool sample is negative as well as your C. difficile.  Stay hydrated and drink plenty of clear fluids including water , broth and electrolyte drinks.  Eat a bland easy to digest diet.  The brat diet follows bananas, rice, applesauce and toast.  Avoid dairy, spicy, greasy or high-fiber foods until stools normalize.  Plenty of rest and follow-up with Dr. Antony Baumgartner for further management.

## 2023-11-01 ENCOUNTER — Inpatient Hospital Stay: Admission: RE | Admit: 2023-11-01 | Payer: PRIVATE HEALTH INSURANCE | Source: Ambulatory Visit

## 2023-11-02 LAB — CMP14+EGFR
ALT: 18 IU/L (ref 0–44)
AST: 17 IU/L (ref 0–40)
Albumin: 4.1 g/dL (ref 3.9–4.9)
Alkaline Phosphatase: 143 IU/L — ABNORMAL HIGH (ref 44–121)
BUN/Creatinine Ratio: 13 (ref 10–24)
BUN: 11 mg/dL (ref 8–27)
Bilirubin Total: 0.3 mg/dL (ref 0.0–1.2)
CO2: 23 mmol/L (ref 20–29)
Calcium: 9 mg/dL (ref 8.6–10.2)
Chloride: 103 mmol/L (ref 96–106)
Creatinine, Ser: 0.86 mg/dL (ref 0.76–1.27)
Globulin, Total: 2.2 g/dL (ref 1.5–4.5)
Glucose: 93 mg/dL (ref 70–99)
Potassium: 4.2 mmol/L (ref 3.5–5.2)
Sodium: 139 mmol/L (ref 134–144)
Total Protein: 6.3 g/dL (ref 6.0–8.5)
eGFR: 99 mL/min/{1.73_m2} (ref >59)

## 2023-11-02 LAB — TSH+T4F+T3FREE+THYABS+TPO+VD25
Free T4: 1.14 ng/dL (ref 0.82–1.77)
T3, Free: 3.6 pg/mL (ref 2.0–4.4)
TSH: 2.75 u[IU]/mL (ref 0.450–4.500)
Thyroglobulin Antibody: <1 [IU]/mL (ref 0.0–0.9)
Thyroperoxidase Ab SerPl-aCnc: 10 [IU]/mL (ref 0–34)
Vit D, 25-Hydroxy: 34.4 ng/mL (ref 30.0–100.0)

## 2023-11-02 LAB — CBC WITH DIFFERENTIAL/PLATELET
Basophils Absolute: 0 10*3/uL (ref 0.0–0.2)
Basos: 0 %
EOS (ABSOLUTE): 0.2 10*3/uL (ref 0.0–0.4)
Eos: 4 %
Hematocrit: 44.3 % (ref 37.5–51.0)
Hemoglobin: 13.9 g/dL (ref 13.0–17.7)
Immature Grans (Abs): 0 10*3/uL (ref 0.0–0.1)
Immature Granulocytes: 0 %
Lymphocytes Absolute: 1.5 10*3/uL (ref 0.7–3.1)
Lymphs: 29 %
MCH: 28.9 pg (ref 26.6–33.0)
MCHC: 31.4 g/dL — ABNORMAL LOW (ref 31.5–35.7)
MCV: 92 fL (ref 79–97)
Monocytes Absolute: 0.4 10*3/uL (ref 0.1–0.9)
Monocytes: 7 %
Neutrophils Absolute: 3.1 10*3/uL (ref 1.4–7.0)
Neutrophils: 60 %
Platelets: 109 10*3/uL — ABNORMAL LOW (ref 150–450)
RBC: 4.81 x10E6/uL (ref 4.14–5.80)
RDW: 13.1 % (ref 11.6–15.4)
WBC: 5.2 10*3/uL (ref 3.4–10.8)

## 2023-11-02 LAB — B12 AND FOLATE PANEL
Folate: 14.6 ng/mL (ref >3.0)
Vitamin B-12: 601 pg/mL (ref 232–1245)

## 2023-11-02 LAB — LIPID PANEL
Chol/HDL Ratio: 3 ratio (ref 0.0–5.0)
Cholesterol, Total: 66 mg/dL — ABNORMAL LOW (ref 100–199)
HDL: 22 mg/dL — ABNORMAL LOW (ref >39)
LDL Chol Calc (NIH): 30 mg/dL (ref 0–99)
Triglycerides: 54 mg/dL (ref 0–149)
VLDL Cholesterol Cal: 14 mg/dL (ref 5–40)

## 2023-11-02 LAB — IRON,TIBC AND FERRITIN PANEL
Ferritin: 219 ng/mL (ref 30–400)
Iron Saturation: 27 % (ref 15–55)
Iron: 68 ug/dL (ref 38–169)
Total Iron Binding Capacity: 253 ug/dL (ref 250–450)
UIBC: 185 ug/dL (ref 111–343)

## 2023-11-02 LAB — HIV ANTIBODY (ROUTINE TESTING W REFLEX): HIV Screen 4th Generation wRfx: NONREACTIVE

## 2023-11-18 ENCOUNTER — Encounter: Payer: Self-pay | Admitting: Nurse Practitioner

## 2023-11-18 ENCOUNTER — Ambulatory Visit: Payer: PRIVATE HEALTH INSURANCE | Admitting: Nurse Practitioner

## 2023-11-18 VITALS — BP 114/76 | HR 69 | Temp 98.3°F | Resp 16 | Ht 67.0 in | Wt 153.0 lb

## 2023-11-18 DIAGNOSIS — R634 Abnormal weight loss: Secondary | ICD-10-CM | POA: Diagnosis not present

## 2023-11-18 DIAGNOSIS — D696 Thrombocytopenia, unspecified: Secondary | ICD-10-CM

## 2023-11-18 DIAGNOSIS — E1159 Type 2 diabetes mellitus with other circulatory complications: Secondary | ICD-10-CM

## 2023-11-18 DIAGNOSIS — I700 Atherosclerosis of aorta: Secondary | ICD-10-CM

## 2023-11-18 NOTE — Progress Notes (Signed)
 [facility] [address] [place], [place] [address]  Internal MEDICINE  Office Visit Note  Patient Name: Donald Mendoza  [id number]  [id number]  Date of Service: 11/18/2023  Chief Complaint  Patient presents with   Gastroesophageal Reflux   Hyperlipidemia   Follow-up    Review labs    HPI Tavion presents for a follow-up visit for  Low vitamin D  Diarrhea is improving somewhat, has appt with GI in August.  Elevated alk phos Thrombocytopenia     Current Medication: Outpatient Encounter Medications as of 11/18/2023  Medication Sig   cyanocobalamin  (VITAMIN B12) 1000 MCG tablet Take 1 tablet (1,000 mcg total) by mouth daily.   latanoprost  (XALATAN ) 0.005 % ophthalmic solution INSTILL 1 DROP IN BOTH EYES AT BEDTIME   loperamide  (IMODIUM  A-D) 2 MG tablet Take 1 tablet (2 mg total) by mouth 4 (four) times daily as needed.   pantoprazole  (PROTONIX ) 40 MG tablet TAKE 1 TABLET(40 MG) BY MOUTH DAILY BEFORE BREAKFAST   pravastatin  (PRAVACHOL ) 40 MG tablet Take 1 tablet (40 mg total) by mouth daily.   sildenafil  (VIAGRA ) 100 MG tablet Take 1 tablet (100 mg total) by mouth daily as needed for erectile dysfunction.   Vitamin D , Ergocalciferol , (DRISDOL ) 1.25 MG (50000 UNIT) CAPS capsule TAKE 1 CAPSULE BY MOUTH 1 TIME A WEEK   No facility-administered encounter medications on file as of 11/18/2023.    Surgical History: Past Surgical History:  Procedure Laterality Date   COLONOSCOPY     COLONOSCOPY N/A 10/03/2023   Procedure: COLONOSCOPY;  Surgeon: Therisa Bi, MD;  Location: [facility] ENDOSCOPY;  Service: Gastroenterology;  Laterality: N/A;   COLONOSCOPY WITH PROPOFOL  N/A 01/16/2022   Procedure: COLONOSCOPY WITH PROPOFOL ;  Surgeon: Therisa Bi, MD;  Location: [facility] ENDOSCOPY;  Service: Gastroenterology;  Laterality: N/A;   ESOPHAGOGASTRODUODENOSCOPY N/A 01/16/2022   Procedure: ESOPHAGOGASTRODUODENOSCOPY (EGD);  Surgeon: Therisa Bi, MD;  Location: [facility] ENDOSCOPY;  Service:  Gastroenterology;  Laterality: N/A;   FOOT SURGERY Right 05/24/2021   HERNIA REPAIR     10 years ago, [facility]. umbilical hernia x 2   PELVIC LYMPH NODE DISSECTION Bilateral 05/27/2017   Procedure: PELVIC LYMPH NODE DISSECTION;  Surgeon: Penne Knee, MD;  Location: [facility] ORS;  Service: Urology;  Laterality: Bilateral;   ROBOT ASSISTED LAPAROSCOPIC RADICAL PROSTATECTOMY N/A 05/27/2017   Procedure: ROBOTIC ASSISTED LAPAROSCOPIC RADICAL PROSTATECTOMY;  Surgeon: Penne Knee, MD;  Location: [facility] ORS;  Service: Urology;  Laterality: N/A;    Medical History: Past Medical History:  Diagnosis Date   B12 deficiency 04/21/2017   Cancer (HCC) 2018   Prostate Cancer   Diverticulosis    GERD (gastroesophageal reflux disease)    Glaucoma    Hypercholesteremia     Family History: Family History  Problem Relation Age of Onset   Diabetes Mother    Breast cancer Mother    Arthritis Father    Breast cancer Sister     Social History   Socioeconomic History   Marital status: Single    Spouse name: Not on file   Number of children: Not on file   Years of education: Not on file   Highest education level: Not on file  Occupational History   Not on file  Tobacco Use   Smoking status: Former    Current packs/day: 0.00    Average packs/day: 0.8 packs/day for 34.0 years (25.5 ttl pk-yrs)    Types: Cigarettes    Start date: 06/15/1984    Quit date: 06/15/2018    Years since  quitting: 5.4   Smokeless tobacco: Never  Vaping Use   Vaping status: Never Used  Substance and Sexual Activity   Alcohol use: Yes    Comment: socialy    Drug use: No   Sexual activity: Yes  Other Topics Concern   Not on file  Social History Narrative   Not on file   Social Drivers of Health   Financial Resource Strain: Not on file  Food Insecurity: Not on file  Transportation Needs: Not on file  Physical Activity: Not on file  Stress: Not on file  Social Connections: Not on file  Intimate Partner Violence:  Not on file      Review of Systems  Vital Signs: BP 114/76   Pulse 69   Temp 98.3 F (36.8 C)   Resp 16   Ht 5' 7 (1.702 m)   Wt 153 lb (69.4 kg)   SpO2 99%   BMI 23.96 kg/m    Physical Exam     Assessment/Plan:   General Counseling: Kaushik verbalizes understanding of the findings of todays visit and agrees with plan of treatment. I have discussed any further diagnostic evaluation that may be needed or ordered today. We also reviewed his medications today. he has been encouraged to call the office with any questions or concerns that should arise related to todays visit.    No orders of the defined types were placed in this encounter.   No orders of the defined types were placed in this encounter.   Return in about 6 months (around 05/19/2024) for F/U, Jhayden Demuro PCP.   Total time spent:*** Minutes Time spent includes review of chart, medications, test results, and follow up plan with the patient.   [place] Controlled Substance Database was reviewed by me.  This patient was seen by Mardy Maxin, FNP-C in collaboration with Dr. Sigrid Bathe as a part of collaborative care agreement.   Kashton Mcartor R. Maxin, MSN, FNP-C Internal medicine

## 2023-11-21 ENCOUNTER — Encounter: Payer: Self-pay | Admitting: Nurse Practitioner

## 2023-12-06 ENCOUNTER — Telehealth: Payer: Self-pay | Admitting: Nurse Practitioner

## 2023-12-06 NOTE — Telephone Encounter (Addendum)
 Patient cancelled chest CT due to [facility] not being in network with his insurance. Ieft him vm to see if he wants me to get auth with [facility] since they are in network-Toni

## 2023-12-09 ENCOUNTER — Encounter: Payer: Self-pay | Admitting: Oncology

## 2023-12-22 ENCOUNTER — Other Ambulatory Visit: Payer: Self-pay | Admitting: Oncology

## 2023-12-22 ENCOUNTER — Other Ambulatory Visit: Payer: Self-pay | Admitting: Nurse Practitioner

## 2023-12-22 DIAGNOSIS — E559 Vitamin D deficiency, unspecified: Secondary | ICD-10-CM

## 2023-12-22 DIAGNOSIS — M7701 Medial epicondylitis, right elbow: Secondary | ICD-10-CM

## 2023-12-23 ENCOUNTER — Encounter: Payer: Self-pay | Admitting: Oncology

## 2023-12-27 ENCOUNTER — Inpatient Hospital Stay: Admission: RE | Admit: 2023-12-27 | Payer: PRIVATE HEALTH INSURANCE | Source: Ambulatory Visit

## 2024-01-03 ENCOUNTER — Ambulatory Visit: Admission: RE | Admit: 2024-01-03 | Payer: PRIVATE HEALTH INSURANCE | Source: Ambulatory Visit

## 2024-01-21 ENCOUNTER — Encounter: Payer: Self-pay | Admitting: Nurse Practitioner

## 2024-01-21 ENCOUNTER — Ambulatory Visit: Payer: PRIVATE HEALTH INSURANCE | Admitting: Nurse Practitioner

## 2024-01-21 VITALS — BP 118/74 | HR 70 | Temp 98.3°F | Resp 16 | Ht 67.0 in | Wt 150.4 lb

## 2024-01-21 DIAGNOSIS — R82998 Other abnormal findings in urine: Secondary | ICD-10-CM | POA: Diagnosis not present

## 2024-01-21 DIAGNOSIS — R1314 Dysphagia, pharyngoesophageal phase: Secondary | ICD-10-CM

## 2024-01-21 DIAGNOSIS — K219 Gastro-esophageal reflux disease without esophagitis: Secondary | ICD-10-CM | POA: Diagnosis not present

## 2024-01-21 DIAGNOSIS — K529 Noninfective gastroenteritis and colitis, unspecified: Secondary | ICD-10-CM

## 2024-01-21 DIAGNOSIS — R634 Abnormal weight loss: Secondary | ICD-10-CM

## 2024-01-21 LAB — POCT URINALYSIS DIPSTICK
Bilirubin, UA: NEGATIVE
Blood, UA: NEGATIVE
Glucose, UA: NEGATIVE
Leukocytes, UA: NEGATIVE
Nitrite, UA: NEGATIVE
Protein, UA: NEGATIVE
Spec Grav, UA: 1.02 (ref 1.010–1.025)
Urobilinogen, UA: 0.2 U/dL
pH, UA: 5 (ref 5.0–8.0)

## 2024-01-21 MED ORDER — PANTOPRAZOLE SODIUM 40 MG PO TBEC: 40.0000 mg | DELAYED_RELEASE_TABLET | Freq: Every day | ORAL | 3 refills | Status: AC

## 2024-01-21 NOTE — Progress Notes (Signed)
 [facility] [address] [place], [place] [address]  Internal MEDICINE  Office Visit Note  Patient Name: Donald Mendoza  [id number]  [id number]  Date of Service: 01/21/2024  Chief Complaint  Patient presents with   Gastroesophageal Reflux   Hyperlipidemia   Follow-up    Review CT    HPI Donald Mendoza presents for a follow-up visit for      Current Medication: Outpatient Encounter Medications as of 01/21/2024  Medication Sig   cyanocobalamin  (VITAMIN B12) 1000 MCG tablet TAKE 1 TABLET BY MOUTH DAILY   latanoprost  (XALATAN ) 0.005 % ophthalmic solution INSTILL 1 DROP IN BOTH EYES AT BEDTIME   loperamide  (IMODIUM  A-D) 2 MG tablet Take 1 tablet (2 mg total) by mouth 4 (four) times daily as needed.   methocarbamol  (ROBAXIN ) 500 MG tablet TAKE 1 TABLET(500 MG) BY MOUTH EVERY 8 HOURS AS NEEDED FOR MUSCLE SPASMS   pantoprazole  (PROTONIX ) 40 MG tablet Take 1 tablet (40 mg total) by mouth daily.   pravastatin  (PRAVACHOL ) 40 MG tablet Take 1 tablet (40 mg total) by mouth daily.   sildenafil  (VIAGRA ) 100 MG tablet Take 1 tablet (100 mg total) by mouth daily as needed for erectile dysfunction.   Vitamin D , Ergocalciferol , (DRISDOL ) 1.25 MG (50000 UNIT) CAPS capsule TAKE 1 CAPSULE BY MOUTH EVERY 7 DAYS   [DISCONTINUED] pantoprazole  (PROTONIX ) 40 MG tablet TAKE 1 TABLET(40 MG) BY MOUTH DAILY BEFORE BREAKFAST   No facility-administered encounter medications on file as of 01/21/2024.    Surgical History: Past Surgical History:  Procedure Laterality Date   COLONOSCOPY     COLONOSCOPY N/A 10/03/2023   Procedure: COLONOSCOPY;  Surgeon: Therisa Bi, MD;  Location: [facility] ENDOSCOPY;  Service: Gastroenterology;  Laterality: N/A;   COLONOSCOPY WITH PROPOFOL  N/A 01/16/2022   Procedure: COLONOSCOPY WITH PROPOFOL ;  Surgeon: Therisa Bi, MD;  Location: [facility] ENDOSCOPY;  Service: Gastroenterology;  Laterality: N/A;   ESOPHAGOGASTRODUODENOSCOPY N/A 01/16/2022   Procedure: ESOPHAGOGASTRODUODENOSCOPY  (EGD);  Surgeon: Therisa Bi, MD;  Location: [facility] ENDOSCOPY;  Service: Gastroenterology;  Laterality: N/A;   FOOT SURGERY Right 05/24/2021   HERNIA REPAIR     10 years ago, [facility]. umbilical hernia x 2   PELVIC LYMPH NODE DISSECTION Bilateral 05/27/2017   Procedure: PELVIC LYMPH NODE DISSECTION;  Surgeon: Penne Knee, MD;  Location: [facility] ORS;  Service: Urology;  Laterality: Bilateral;   ROBOT ASSISTED LAPAROSCOPIC RADICAL PROSTATECTOMY N/A 05/27/2017   Procedure: ROBOTIC ASSISTED LAPAROSCOPIC RADICAL PROSTATECTOMY;  Surgeon: Penne Knee, MD;  Location: [facility] ORS;  Service: Urology;  Laterality: N/A;    Medical History: Past Medical History:  Diagnosis Date   B12 deficiency 04/21/2017   Cancer (HCC) 2018   Prostate Cancer   Diverticulosis    GERD (gastroesophageal reflux disease)    Glaucoma    Hypercholesteremia     Family History: Family History  Problem Relation Age of Onset   Diabetes Mother    Breast cancer Mother    Arthritis Father    Breast cancer Sister     Social History   Socioeconomic History   Marital status: Single    Spouse name: Not on file   Number of children: Not on file   Years of education: Not on file   Highest education level: Not on file  Occupational History   Not on file  Tobacco Use   Smoking status: Former    Current packs/day: 0.00    Average packs/day: 0.8 packs/day for 34.0 years (25.5 ttl pk-yrs)    Types: Cigarettes  Start date: 06/15/1984    Quit date: 06/15/2018    Years since quitting: 5.6   Smokeless tobacco: Never  Vaping Use   Vaping status: Never Used  Substance and Sexual Activity   Alcohol use: Yes    Comment: socialy    Drug use: No   Sexual activity: Yes  Other Topics Concern   Not on file  Social History Narrative   Not on file   Social Drivers of Health   Financial Resource Strain: Not on file  Food Insecurity: Not on file  Transportation Needs: Not on file  Physical Activity: Not on file  Stress: Not  on file  Social Connections: Not on file  Intimate Partner Violence: Not on file      Review of Systems  Vital Signs: BP 118/74   Pulse 70   Temp 98.3 F (36.8 C)   Resp 16   Ht 5' 7 (1.702 m)   Wt 150 lb 6.4 oz (68.2 kg)   SpO2 97%   BMI 23.56 kg/m    Physical Exam     Assessment/Plan:   General Counseling: Donald Mendoza verbalizes understanding of the findings of todays visit and agrees with plan of treatment. I have discussed any further diagnostic evaluation that may be needed or ordered today. We also reviewed his medications today. he has been encouraged to call the office with any questions or concerns that should arise related to todays visit.    Orders Placed This Encounter  Procedures   ESOPHAGEAL MANOMETRY   CT SOFT TISSUE NECK WO CONTRAST   POCT Urinalysis Dipstick    Meds ordered this encounter  Medications   pantoprazole  (PROTONIX ) 40 MG tablet    Sig: Take 1 tablet (40 mg total) by mouth daily.    Dispense:  90 tablet    Refill:  3    Return in about 6 weeks (around 03/03/2024) for F/U to discuss results of CT and GI testing with [name] PCP. SABRA   Total time spent:*** Minutes Time spent includes review of chart, medications, test results, and follow up plan with the patient.   [place] Controlled Substance Database was reviewed by me.  This patient was seen by Mardy Maxin, FNP-C in collaboration with Dr. Sigrid Bathe as a part of collaborative care agreement.   Genevive Printup R. Maxin, MSN, FNP-C Internal medicine

## 2024-01-22 ENCOUNTER — Encounter: Payer: Self-pay | Admitting: Nurse Practitioner

## 2024-01-24 LAB — CULTURE, URINE COMPREHENSIVE

## 2024-02-03 ENCOUNTER — Other Ambulatory Visit: Payer: Self-pay

## 2024-02-03 DIAGNOSIS — C61 Malignant neoplasm of prostate: Secondary | ICD-10-CM

## 2024-02-10 ENCOUNTER — Ambulatory Visit
Admission: RE | Admit: 2024-02-10 | Discharge: 2024-02-10 | Disposition: A | Discharge status: Home / Self Care | Payer: PRIVATE HEALTH INSURANCE | Source: Ambulatory Visit | Attending: Nurse Practitioner

## 2024-02-10 ENCOUNTER — Encounter: Payer: Self-pay | Admitting: Oncology

## 2024-02-10 DIAGNOSIS — R918 Other nonspecific abnormal finding of lung field: Secondary | ICD-10-CM

## 2024-02-18 DIAGNOSIS — N529 Male erectile dysfunction, unspecified: Secondary | ICD-10-CM | POA: Insufficient documentation

## 2024-02-18 NOTE — Progress Notes (Signed)
   02/24/2024 3:01 PM   Choice Donald Mendoza 03/23/1961 [id number]  Reason for visit: Follow up prostate Ca   HPI: 63 y.o. male, initial follow up with me today, previously seen by Dr. Penne in Oct 2024 Overall doing well, pleasant gentleman Recent PSA undetectable (October 2025) He does report rare intermittent nocturnal leakage, may be associated with urgency.  He is drinking some fluid, some alcohol before bedtime Able to achieve spontaneous erections, sometimes requires 100 mg sildenafil  No daytime leakage or SUI  Prior HPI: Prostate cancer History of unfavorable intermediate risk prostate cancer S/p RALP + BPLND (Jan 2019)   Path = pT2N0 GS 4+3, -sm  Physical Exam: BP 110/61   Pulse 91   Ht 5' 7 (1.702 m)   Wt 155 lb (70.3 kg)   BMI 24.28 kg/m    Constitutional:  Alert and oriented, No acute distress.   Laboratory Data:  Latest Reference Range & Units 02/22/17 16:06 06/25/17 14:24 10/31/17 14:45 12/18/17 15:57 05/05/18 14:25 06/26/18 10:40 11/03/18 09:29 02/03/19 15:08 05/01/19 14:29 08/10/19 15:19 02/10/20 15:30 09/05/20 13:49 02/15/21 14:29 03/03/21 14:19 09/20/21 11:24 02/15/22 11:04 02/26/23 14:55 10/31/23 09:34  Prostate Specific Ag, Serum 0.0 - 4.0 ng/mL 5.2 (H) <0.1  <0.1  <0.1  <0.1  <0.1 <0.1 <0.1 <0.1   <0.1 <0.1 <0.1  Prostatic Specific Antigen 0.00 - 4.00 ng/mL   <0.01  <0.01  <0.01  0.01     <0.01 <0.01     (H): Data is abnormally high  Pertinent Imaging: N/A   Assessment & Plan:    Prostate cancer [facility]) Assessment & Plan: S/p RALP + BPLND (Jan 2019)   Path = pT2N0 GS 4+3, -sm  PSAs undetectable, last in Oct 2025  - Okay to transition to annual PSA surveillance (now 6 years NED). RTC in 12 mo with PSA prior   ED (erectile dysfunction) of organic origin Assessment & Plan: Organic ED + post-RP   - refilling 100mg  sildenafil  PRN   Orders: -     Sildenafil  Citrate; Take 1 tablet (100 mg total) by mouth daily as needed for erectile  dysfunction.  Dispense: 30 tablet; Refill: 3       Penne JONELLE Skye, MD  [facility] [address] [place], [place] [address] [phone]

## 2024-02-18 NOTE — Assessment & Plan Note (Addendum)
 S/p RALP + BPLND (Jan 2019)   Path = pT2N0 GS 4+3, -sm  PSAs undetectable, last in Oct 2025  - Okay to transition to annual PSA surveillance (now 6 years NED). RTC in 12 mo with PSA prior

## 2024-02-18 NOTE — Assessment & Plan Note (Addendum)
 Organic ED + post-RP   - refilling 100mg  sildenafil  PRN

## 2024-02-20 ENCOUNTER — Encounter: Payer: Self-pay | Admitting: Oncology

## 2024-02-20 ENCOUNTER — Other Ambulatory Visit: Payer: PRIVATE HEALTH INSURANCE

## 2024-02-20 DIAGNOSIS — C61 Malignant neoplasm of prostate: Secondary | ICD-10-CM

## 2024-02-21 ENCOUNTER — Other Ambulatory Visit: Payer: Self-pay

## 2024-02-21 LAB — PSA: Prostate Specific Ag, Serum: <0.1 ng/mL (ref 0.0–4.0)

## 2024-02-24 ENCOUNTER — Ambulatory Visit (INDEPENDENT_AMBULATORY_CARE_PROVIDER_SITE_OTHER): Payer: PRIVATE HEALTH INSURANCE | Admitting: Urology

## 2024-02-24 VITALS — BP 110/61 | HR 91 | Ht 67.0 in | Wt 155.0 lb

## 2024-02-24 DIAGNOSIS — N529 Male erectile dysfunction, unspecified: Secondary | ICD-10-CM

## 2024-02-24 DIAGNOSIS — C61 Malignant neoplasm of prostate: Secondary | ICD-10-CM

## 2024-02-24 MED ORDER — SILDENAFIL CITRATE 100 MG PO TABS: 100.0000 mg | ORAL_TABLET | Freq: Every day | ORAL | 3 refills | Status: DC | PRN

## 2024-02-24 NOTE — Addendum Note (Signed)
 Addended by: Donielle Kaigler E on: 02/24/2024 03:07 PM   Modules accepted: Orders

## 2024-02-25 ENCOUNTER — Ambulatory Visit: Payer: PRIVATE HEALTH INSURANCE | Admitting: Urology

## 2024-02-25 ENCOUNTER — Ambulatory Visit: Payer: Self-pay | Admitting: Urology

## 2024-03-03 ENCOUNTER — Encounter: Payer: Self-pay | Admitting: Nurse Practitioner

## 2024-03-03 ENCOUNTER — Encounter: Payer: PRIVATE HEALTH INSURANCE | Admitting: Nurse Practitioner

## 2024-03-04 ENCOUNTER — Encounter: Payer: Self-pay | Admitting: Oncology

## 2024-03-04 ENCOUNTER — Ambulatory Visit
Admission: RE | Admit: 2024-03-04 | Discharge: 2024-03-04 | Disposition: A | Discharge status: Home / Self Care | Source: Ambulatory Visit | Attending: Nurse Practitioner | Admitting: Nurse Practitioner

## 2024-03-04 DIAGNOSIS — K219 Gastro-esophageal reflux disease without esophagitis: Secondary | ICD-10-CM

## 2024-03-04 DIAGNOSIS — R1314 Dysphagia, pharyngoesophageal phase: Secondary | ICD-10-CM

## 2024-03-11 ENCOUNTER — Ambulatory Visit: Payer: Self-pay | Admitting: Internal Medicine

## 2024-03-24 ENCOUNTER — Ambulatory Visit (INDEPENDENT_AMBULATORY_CARE_PROVIDER_SITE_OTHER): Payer: PRIVATE HEALTH INSURANCE | Admitting: Nurse Practitioner

## 2024-03-24 ENCOUNTER — Encounter: Payer: Self-pay | Admitting: Nurse Practitioner

## 2024-03-24 VITALS — BP 107/73 | HR 81 | Temp 98.0°F | Resp 16 | Ht 67.0 in | Wt 154.0 lb

## 2024-03-24 DIAGNOSIS — K219 Gastro-esophageal reflux disease without esophagitis: Secondary | ICD-10-CM | POA: Diagnosis not present

## 2024-03-24 DIAGNOSIS — E1165 Type 2 diabetes mellitus with hyperglycemia: Secondary | ICD-10-CM | POA: Diagnosis not present

## 2024-03-24 DIAGNOSIS — E1159 Type 2 diabetes mellitus with other circulatory complications: Secondary | ICD-10-CM | POA: Diagnosis not present

## 2024-03-24 DIAGNOSIS — J432 Centrilobular emphysema: Secondary | ICD-10-CM

## 2024-03-24 DIAGNOSIS — R1314 Dysphagia, pharyngoesophageal phase: Secondary | ICD-10-CM

## 2024-03-24 DIAGNOSIS — I152 Hypertension secondary to endocrine disorders: Secondary | ICD-10-CM | POA: Diagnosis not present

## 2024-03-24 MED ORDER — XIFAXAN 550 MG PO TABS: 550.0000 mg | ORAL_TABLET | Freq: Three times a day (TID) | ORAL | 0 refills | Status: DC

## 2024-03-24 NOTE — Progress Notes (Signed)
 [facility] [address] [place], [place] [address]  Internal MEDICINE  Office Visit Note  Patient Name: Donald Mendoza  [id number]  [id number]  Date of Service: 03/24/2024  Chief Complaint  Patient presents with   Follow-up    Review chest CT    HPI Donald Mendoza presents for a follow-up visit for CT chest results.  Lung cancer screening is stable Dysphagia -- improved since increasing his GERD medication to twice daily.  DDD in the lower cervical spine -- seen on CT chest imaging.  Mild emphysema noted on CT chest.  GERD has improved with taking PPI medication Nausea and vomiting has resolved and diarrhea is improving.  He has gained a few lbs since his last visit.     Current Medication: Outpatient Encounter Medications as of 03/24/2024  Medication Sig   cyanocobalamin  (VITAMIN B12) 1000 MCG tablet TAKE 1 TABLET BY MOUTH DAILY   latanoprost  (XALATAN ) 0.005 % ophthalmic solution INSTILL 1 DROP IN BOTH EYES AT BEDTIME   pantoprazole  (PROTONIX ) 40 MG tablet Take 1 tablet (40 mg total) by mouth daily.   pravastatin  (PRAVACHOL ) 40 MG tablet Take 1 tablet (40 mg total) by mouth daily.   sildenafil  (VIAGRA ) 100 MG tablet Take 1 tablet (100 mg total) by mouth daily as needed for erectile dysfunction.   [DISCONTINUED] loperamide  (IMODIUM  A-D) 2 MG tablet Take 1 tablet (2 mg total) by mouth 4 (four) times daily as needed.   [DISCONTINUED] methocarbamol  (ROBAXIN ) 500 MG tablet TAKE 1 TABLET(500 MG) BY MOUTH EVERY 8 HOURS AS NEEDED FOR MUSCLE SPASMS   [DISCONTINUED] Vitamin D , Ergocalciferol , (DRISDOL ) 1.25 MG (50000 UNIT) CAPS capsule TAKE 1 CAPSULE BY MOUTH EVERY 7 DAYS   [DISCONTINUED] XIFAXAN  550 MG TABS tablet Take 550 mg by mouth 3 (three) times daily.   XIFAXAN  550 MG TABS tablet Take 1 tablet (550 mg total) by mouth 3 (three) times daily.   No facility-administered encounter medications on file as of 03/24/2024.    Surgical History: Past Surgical History:   Procedure Laterality Date   COLONOSCOPY     COLONOSCOPY N/A 10/03/2023   Procedure: COLONOSCOPY;  Surgeon: Therisa Bi, MD;  Location: [facility] ENDOSCOPY;  Service: Gastroenterology;  Laterality: N/A;   COLONOSCOPY WITH PROPOFOL  N/A 01/16/2022   Procedure: COLONOSCOPY WITH PROPOFOL ;  Surgeon: Therisa Bi, MD;  Location: [facility] ENDOSCOPY;  Service: Gastroenterology;  Laterality: N/A;   ESOPHAGOGASTRODUODENOSCOPY N/A 01/16/2022   Procedure: ESOPHAGOGASTRODUODENOSCOPY (EGD);  Surgeon: Therisa Bi, MD;  Location: [facility] ENDOSCOPY;  Service: Gastroenterology;  Laterality: N/A;   FOOT SURGERY Right 05/24/2021   HERNIA REPAIR     10 years ago, [facility]. umbilical hernia x 2   PELVIC LYMPH NODE DISSECTION Bilateral 05/27/2017   Procedure: PELVIC LYMPH NODE DISSECTION;  Surgeon: Penne Knee, MD;  Location: [facility] ORS;  Service: Urology;  Laterality: Bilateral;   ROBOT ASSISTED LAPAROSCOPIC RADICAL PROSTATECTOMY N/A 05/27/2017   Procedure: ROBOTIC ASSISTED LAPAROSCOPIC RADICAL PROSTATECTOMY;  Surgeon: Penne Knee, MD;  Location: [facility] ORS;  Service: Urology;  Laterality: N/A;    Medical History: Past Medical History:  Diagnosis Date   B12 deficiency 04/21/2017   Cancer (HCC) 2018   Prostate Cancer   Diverticulosis    GERD (gastroesophageal reflux disease)    Glaucoma    Hypercholesteremia     Family History: Family History  Problem Relation Age of Onset   Diabetes Mother    Breast cancer Mother    Arthritis Father    Breast cancer Sister     Social  History   Socioeconomic History   Marital status: Single    Spouse name: Not on file   Number of children: Not on file   Years of education: Not on file   Highest education level: Not on file  Occupational History   Not on file  Tobacco Use   Smoking status: Former    Current packs/day: 0.00    Average packs/day: 0.8 packs/day for 34.0 years (25.5 ttl pk-yrs)    Types: Cigarettes    Start date: 06/15/1984    Quit date: 06/15/2018    Years  since quitting: 5.8   Smokeless tobacco: Never  Vaping Use   Vaping status: Never Used  Substance and Sexual Activity   Alcohol use: Yes    Comment: socialy    Drug use: No   Sexual activity: Yes  Other Topics Concern   Not on file  Social History Narrative   Not on file   Social Drivers of Health   Financial Resource Strain: Not on file  Food Insecurity: Not on file  Transportation Needs: Not on file  Physical Activity: Not on file  Stress: Not on file  Social Connections: Not on file  Intimate Partner Violence: Not on file      Review of Systems  Constitutional: Negative.  Negative for fatigue.  HENT:  Positive for trouble swallowing (history of this issue before, with no further improvement.).   Respiratory: Negative.  Negative for cough, chest tightness, shortness of breath and wheezing.   Cardiovascular: Negative.  Negative for chest pain and palpitations.  Gastrointestinal:  Positive for diarrhea.       Heartburn  Neurological: Negative.  Negative for headaches.    Vital Signs: BP 107/73   Pulse 81   Temp 98 F (36.7 C)   Resp 16   Ht 5' 7 (1.702 m)   Wt 154 lb (69.9 kg)   SpO2 97%   BMI 24.12 kg/m    Physical Exam Vitals reviewed.  Constitutional:      General: He is not in acute distress.    Appearance: Normal appearance. He is normal weight. He is not ill-appearing.  HENT:     Head: Normocephalic and atraumatic.  Eyes:     Pupils: Pupils are equal, round, and reactive to light.  Cardiovascular:     Rate and Rhythm: Normal rate and regular rhythm.  Abdominal:     General: Bowel sounds are normal. There is no distension.     Palpations: Abdomen is soft. There is no mass.     Tenderness: There is no abdominal tenderness. There is no guarding or rebound.     Hernia: No hernia is present.  Skin:    General: Skin is warm and dry.     Capillary Refill: Capillary refill takes less than 2 seconds.  Neurological:     Mental Status: He is alert and  oriented to person, place, and time.  Psychiatric:        Mood and Affect: Mood normal.        Behavior: Behavior normal.        Assessment/Plan: 1. Gastroesophageal reflux disease without esophagitis Continue medication as prescribed.   2. Pharyngoesophageal dysphagia No further issues, continue to monitor  3. Centrilobular emphysema (HCC) Continue to monitor, patient is not currently having any issues   4. Hypertension associated with type 2 diabetes mellitus (HCC) (Primary) Continue medication as prescribed   5. Type 2 diabetes mellitus with hyperglycemia, without long-term current use of insulin (  HCC) Continue diabetic diet as previously discussed.    General Counseling: Leray verbalizes understanding of the findings of todays visit and agrees with plan of treatment. I have discussed any further diagnostic evaluation that may be needed or ordered today. We also reviewed his medications today. he has been encouraged to call the office with any questions or concerns that should arise related to todays visit.    No orders of the defined types were placed in this encounter.   Meds ordered this encounter  Medications   XIFAXAN  550 MG TABS tablet    Sig: Take 1 tablet (550 mg total) by mouth 3 (three) times daily.    Dispense:  42 tablet    Refill:  0    Fill new script today    Return in about 3 months (around 06/24/2024) for F/U, Recheck A1C, Logan Vegh PCP.   Total time spent:30 Minutes Time spent includes review of chart, medications, test results, and follow up plan with the patient.   [place] Controlled Substance Database was reviewed by me.  This patient was seen by Mardy Maxin, FNP-C in collaboration with Dr. Sigrid Bathe as a part of collaborative care agreement.   Mainor Hellmann R. Maxin, MSN, FNP-C Internal medicine

## 2024-03-30 ENCOUNTER — Telehealth: Payer: Self-pay | Admitting: Oncology

## 2024-03-30 ENCOUNTER — Inpatient Hospital Stay: Payer: PRIVATE HEALTH INSURANCE

## 2024-03-30 ENCOUNTER — Inpatient Hospital Stay: Payer: PRIVATE HEALTH INSURANCE | Admitting: Oncology

## 2024-03-30 NOTE — Telephone Encounter (Signed)
 Pt missed appts today 11/10. I called and spoke with pt and confirmed new date/times.

## 2024-04-04 NOTE — Progress Notes (Signed)
 Cancelled.

## 2024-04-19 DIAGNOSIS — J432 Centrilobular emphysema: Secondary | ICD-10-CM | POA: Insufficient documentation

## 2024-05-15 ENCOUNTER — Encounter: Payer: Self-pay | Admitting: Radiology

## 2024-05-15 ENCOUNTER — Other Ambulatory Visit: Payer: Self-pay

## 2024-05-15 ENCOUNTER — Emergency Department
Admission: EM | Admit: 2024-05-15 | Discharge: 2024-05-15 | Disposition: A | Discharge status: Home / Self Care | Payer: PRIVATE HEALTH INSURANCE | Attending: Emergency Medicine | Admitting: Emergency Medicine

## 2024-05-15 DIAGNOSIS — R197 Diarrhea, unspecified: Secondary | ICD-10-CM | POA: Insufficient documentation

## 2024-05-15 DIAGNOSIS — K921 Melena: Secondary | ICD-10-CM | POA: Diagnosis not present

## 2024-05-15 LAB — COMPREHENSIVE METABOLIC PANEL WITH GFR
ALT: 24 U/L (ref 0–44)
AST: 25 U/L (ref 15–41)
Albumin: 4.4 g/dL (ref 3.5–5.0)
Alkaline Phosphatase: 125 U/L (ref 38–126)
Anion gap: 10 (ref 5–15)
BUN: 13 mg/dL (ref 8–23)
CO2: 27 mmol/L (ref 22–32)
Calcium: 9.4 mg/dL (ref 8.9–10.3)
Chloride: 102 mmol/L (ref 98–111)
Creatinine, Ser: 0.9 mg/dL (ref 0.61–1.24)
GFR, Estimated: >60 mL/min
Glucose, Bld: 99 mg/dL (ref 70–99)
Potassium: 4.1 mmol/L (ref 3.5–5.1)
Sodium: 139 mmol/L (ref 135–145)
Total Bilirubin: 0.5 mg/dL (ref 0.0–1.2)
Total Protein: 7.6 g/dL (ref 6.5–8.1)

## 2024-05-15 LAB — CBC
HCT: 46 % (ref 39.0–52.0)
Hemoglobin: 15.1 g/dL (ref 13.0–17.0)
MCH: 29.2 pg (ref 26.0–34.0)
MCHC: 32.8 g/dL (ref 30.0–36.0)
MCV: 88.8 fL (ref 80.0–100.0)
Platelets: 128 K/uL — ABNORMAL LOW (ref 150–400)
RBC: 5.18 MIL/uL (ref 4.22–5.81)
RDW: 13.5 % (ref 11.5–15.5)
WBC: 5.4 K/uL (ref 4.0–10.5)
nRBC: 0 % (ref 0.0–0.2)

## 2024-05-15 MED ORDER — DIPHENOXYLATE-ATROPINE 2.5-0.025 MG PO TABS: 2.0000 | ORAL_TABLET | Freq: Four times a day (QID) | ORAL | 0 refills | Status: DC | PRN

## 2024-05-15 MED ORDER — DIPHENOXYLATE-ATROPINE 2.5-0.025 MG PO TABS
2.0000 | ORAL_TABLET | Freq: Once | ORAL | Status: AC
  Administered 2024-05-15: 2 via ORAL
  Filled 2024-05-15: qty 2

## 2024-05-15 NOTE — ED Provider Notes (Signed)
 "  [facility] Provider Note    Event Date/Time   First MD Initiated Contact with Patient 05/15/24 1633     (approximate)  History   Chief Complaint: Diarrhea  HPI  DEVONNE LALANI is a 63 y.o. male with a past medical history of gastric reflux, hyperlipidemia, presents to the emergency department for continued diarrhea.  According to the patient ever since having a colonoscopy in May he has had daily diarrhea.  Patient states he has been worse recently where he is having 10+ episodes per day.  He has tried Pepto-Bismol but has not tried any other antidiarrheal medications.  Denies any abdominal pain.  States he had noticed some black stool intermittently but states since stopping Pepto-Bismol he has not seen any more.  Denies any nausea or fever.  Physical Exam   Triage Vital Signs: ED Triage Vitals  Encounter Vitals Group     BP 05/15/24 1455 105/85     Girls Systolic BP Percentile --      Girls Diastolic BP Percentile --      Boys Systolic BP Percentile --      Boys Diastolic BP Percentile --      Pulse Rate 05/15/24 1455 79     Resp 05/15/24 1455 18     Temp 05/15/24 1455 (!) 97.5 F (36.4 C)     Temp Source 05/15/24 1455 Oral     SpO2 05/15/24 1455 99 %     Weight 05/15/24 1450 160 lb (72.6 kg)     Height 05/15/24 1450 5' 7 (1.702 m)     Head Circumference --      Peak Flow --      Pain Score --      Pain Loc --      Pain Education --      Exclude from Growth Chart --     Most recent vital signs: Vitals:   05/15/24 1455  BP: 105/85  Pulse: 79  Resp: 18  Temp: (!) 97.5 F (36.4 C)  SpO2: 99%    General: Awake, no distress.  CV:  Good peripheral perfusion.  Regular rate and rhythm  Resp:  Normal effort.  Equal breath sounds bilaterally.  Abd:  No distention.  Soft, nontender.  No rebound or guarding.  Hyperactive bowel sounds.  ED Results / Procedures / Treatments   EKG  EKG viewed and interpreted by myself shows a normal sinus  rhythm at 78 bpm with narrow QRS, normal axis, normal intervals, no concerning ST changes.  MEDICATIONS ORDERED IN ED: Medications  diphenoxylate -atropine  (LOMOTIL ) 2.5-0.025 MG per tablet 2 tablet (has no administration in time range)     IMPRESSION / MDM / ASSESSMENT AND PLAN / ED COURSE  I reviewed the triage vital signs and the nursing notes.  Patient's presentation is most consistent with acute presentation with potential threat to life or bodily function.  Patient presents the emergency department for continued diarrhea since May.  States he is on a daily basis.  Patient's workup today shows a reassuring physical exam nontender abdomen but hyperactive bowel sounds.  Lab work shows a reassuring CBC with a normal white blood cell count reassuring chemistry.  Abdomen is benign.  Given the patient's reassuring workup we will attempt a dose antidiarrheal medication.  We will dose Lomotil  and have the patient follow-up with his GI doctor.  Patient agreeable to plan of care.  FINAL CLINICAL IMPRESSION(S) / ED DIAGNOSES   Diarrhea    Note:  This document was prepared using Dragon voice recognition software and may include unintentional dictation errors.   Dorothyann Drivers, MD 05/15/24 1658  "

## 2024-05-15 NOTE — ED Triage Notes (Addendum)
 Pt endorses that he has had diarrhea since May and has been seen here for this as well as has been seen by gastro. He endorses that he was prescribed medication but doesn't recall what he was diagnosed with. He has not called GI back due to not having the phone number. It was provided to him today. He endorses that occasionally he has black stool. He endorses that he feels week from having so many episodes of diarrhea now. He endorses that he does take pepto bismol.

## 2024-05-18 ENCOUNTER — Ambulatory Visit: Payer: PRIVATE HEALTH INSURANCE | Admitting: Nurse Practitioner

## 2024-05-19 ENCOUNTER — Encounter: Payer: Self-pay | Admitting: Oncology

## 2024-05-19 ENCOUNTER — Inpatient Hospital Stay: Payer: PRIVATE HEALTH INSURANCE | Attending: Oncology

## 2024-05-19 ENCOUNTER — Inpatient Hospital Stay (HOSPITAL_BASED_OUTPATIENT_CLINIC_OR_DEPARTMENT_OTHER): Payer: PRIVATE HEALTH INSURANCE | Admitting: Oncology

## 2024-05-19 VITALS — BP 106/78 | HR 64 | Temp 96.0°F | Resp 18 | Wt 148.4 lb

## 2024-05-19 DIAGNOSIS — Z87891 Personal history of nicotine dependence: Secondary | ICD-10-CM | POA: Diagnosis not present

## 2024-05-19 DIAGNOSIS — C61 Malignant neoplasm of prostate: Secondary | ICD-10-CM

## 2024-05-19 DIAGNOSIS — Z9079 Acquired absence of other genital organ(s): Secondary | ICD-10-CM | POA: Diagnosis not present

## 2024-05-19 DIAGNOSIS — D696 Thrombocytopenia, unspecified: Secondary | ICD-10-CM | POA: Diagnosis not present

## 2024-05-19 DIAGNOSIS — K529 Noninfective gastroenteritis and colitis, unspecified: Secondary | ICD-10-CM

## 2024-05-19 DIAGNOSIS — F1090 Alcohol use, unspecified, uncomplicated: Secondary | ICD-10-CM | POA: Diagnosis not present

## 2024-05-19 DIAGNOSIS — Z8546 Personal history of malignant neoplasm of prostate: Secondary | ICD-10-CM | POA: Diagnosis not present

## 2024-05-19 DIAGNOSIS — D693 Immune thrombocytopenic purpura: Secondary | ICD-10-CM | POA: Insufficient documentation

## 2024-05-19 DIAGNOSIS — Z79899 Other long term (current) drug therapy: Secondary | ICD-10-CM | POA: Insufficient documentation

## 2024-05-19 LAB — CBC WITH DIFFERENTIAL (CANCER CENTER ONLY)
Abs Immature Granulocytes: 0.02 K/uL (ref 0.00–0.07)
Basophils Absolute: 0 K/uL (ref 0.0–0.1)
Basophils Relative: 1 %
Eosinophils Absolute: 0.1 K/uL (ref 0.0–0.5)
Eosinophils Relative: 2 %
HCT: 44.1 % (ref 39.0–52.0)
Hemoglobin: 14.6 g/dL (ref 13.0–17.0)
Immature Granulocytes: 0 %
Lymphocytes Relative: 31 %
Lymphs Abs: 1.9 K/uL (ref 0.7–4.0)
MCH: 29.1 pg (ref 26.0–34.0)
MCHC: 33.1 g/dL (ref 30.0–36.0)
MCV: 87.8 fL (ref 80.0–100.0)
Monocytes Absolute: 0.4 K/uL (ref 0.1–1.0)
Monocytes Relative: 6 %
Neutro Abs: 3.7 K/uL (ref 1.7–7.7)
Neutrophils Relative %: 60 %
Platelet Count: 110 K/uL — ABNORMAL LOW (ref 150–400)
RBC: 5.02 MIL/uL (ref 4.22–5.81)
RDW: 13.3 % (ref 11.5–15.5)
WBC Count: 6.2 K/uL (ref 4.0–10.5)
nRBC: 0 % (ref 0.0–0.2)

## 2024-05-19 LAB — CMP (CANCER CENTER ONLY)
ALT: 20 U/L (ref 0–44)
AST: 23 U/L (ref 15–41)
Albumin: 4.5 g/dL (ref 3.5–5.0)
Alkaline Phosphatase: 136 U/L — ABNORMAL HIGH (ref 38–126)
Anion gap: 10 (ref 5–15)
BUN: 11 mg/dL (ref 8–23)
CO2: 27 mmol/L (ref 22–32)
Calcium: 9 mg/dL (ref 8.9–10.3)
Chloride: 102 mmol/L (ref 98–111)
Creatinine: 0.98 mg/dL (ref 0.61–1.24)
GFR, Estimated: >60 mL/min
Glucose, Bld: 102 mg/dL — ABNORMAL HIGH (ref 70–99)
Potassium: 3.9 mmol/L (ref 3.5–5.1)
Sodium: 139 mmol/L (ref 135–145)
Total Bilirubin: 0.5 mg/dL (ref 0.0–1.2)
Total Protein: 7.5 g/dL (ref 6.5–8.1)

## 2024-05-19 LAB — VITAMIN B12: Vitamin B-12: 548 pg/mL (ref 180–914)

## 2024-05-19 NOTE — Assessment & Plan Note (Signed)
Follow-up with gastroenterology

## 2024-05-19 NOTE — Assessment & Plan Note (Signed)
 Prostate Cancer pT2(m) pN0 PSA 5.2 gleason score 4+3, grade group 3--Stage IIC, unfavorable intermediate risk group. status post prostatectomy BPLND .   Recent PSA < 0.01 he follows up with urology  annually.

## 2024-05-19 NOTE — Progress Notes (Signed)
 " Hematology/Oncology Progress note [phone] [phone]       PURPOSE OF CONSULTATION:  Follow up of management of thrombocytopenia.  ASSESSMENT & PLAN:   Thrombocytopenia # Chronic thrombocytopenia:  ITP, with possible ETOH marrow suppression.  Labs are reviewed and discussed with patient. Stable platelet counts. Continue observation   Prostate cancer (HCC) Prostate Cancer pT2(m) pN0 PSA 5.2 gleason score 4+3, grade group 3--Stage IIC, unfavorable intermediate risk group. status post prostatectomy BPLND .   Recent PSA < 0.01 he follows up with urology  annually.    Alcohol use Alcohol cessation was discussed with patient.    Chronic diarrhea Follow-up with gastroenterology.     Orders Placed This Encounter  Procedures   CBC with Differential (Cancer Center Only)    Standing Status:   Future    Expected Date:   05/19/2025    Expiration Date:   08/17/2025   CMP (Cancer Center only)    Standing Status:   Future    Expected Date:   05/19/2025    Expiration Date:   08/17/2025   Vitamin B12    Standing Status:   Future    Expected Date:   05/19/2025    Expiration Date:   08/17/2025   Follow up in 12 months  All questions were answered. The patient knows to call the clinic with any problems, questions or concerns.  Zelphia Cap, MD, PhD [facility] Hematology Oncology 05/19/2024   HISTORY OF PRESENTING ILLNESS:  Donald Mendoza is a  63 y.o.  male with PMH listed below who was referred to me for evaluation of thrombocytopenia. Patient was recently diagnosed with intermediate risk prostate cancer in the follows up with Dr. Penne. Initially he was diagnosed with a low-risk prostate cancer Gleason score 3+3 in March 2018 when his PSA was 4. PSA continued to rise to 5.2 and patient had a repeat prostate biopsy revealing Gleason score 4+3. Patient was scheduled for radical prostatectomy however was found to have thrombocytopenia. Referred to us  to  evaluation for the low platelet count. Patient reports that he has been told that his platelet has been low in the past. He never received any treatment.  # S/p RALP with BPLND 05/27/2017, pathology showed Gleason 4+3, pT2 N0, NEGATIVE FOR EXTRAPROSTATIC EXTENSION, SEMINAL VESICLE INVASION, AND BLADDER NECK INVASION. - MARGINS NEGATIVE FOR CARCINOMA.  # Prostate cancer pT2N0, PSA <0.1, no adverse features, , no adjuvant treatment was offered.  # 11/15/2017 bone marrow biopsy showed normocellular bone marrow for age with trilineage hematopoiesis.  Bone marrow showed megakaryocytes with predominantly normal morphology.  Suggesting peripheral destruction, sequestration or consumption of platelets. Bone marrow cytogenetics were normal. Discussed with patient about his bone marrow biopsy results.  His chronic thrombocytopenia most likely secondary to ITP, possibly due to underlying autoimmune process.    INTERVAL HISTORY 63 y.o. male with history of prostate cancer s/p prostectomy, ITP presents for follow-up.  Discussed the use of AI scribe software for clinical note transcription with the patient, who gave verbal consent to proceed.   He has chronic thrombocytopenia with stable platelet counts over the past six months (most recent values 109 and 110). He continues vitamin B12 supplementation. He occasionally consumes alcohol, primarily during holidays, but does not report excessive use. He denies bleeding symptoms or other hematologic complications.  Since a colonoscopy in May 2025, he has experienced persistent, intermittent diarrhea, with a severe exacerbation around Christmas resulting in approximately twenty bowel movements in one day and significant weakness,  requiring two emergency room visits. He has difficulty maintaining weight and keeping food down, attributing recent weight loss to ongoing diarrhea. He is under active evaluation by gastroenterology, with recent laboratory testing for suspected  infection after prior medications provided only temporary relief.   Review of Systems  Constitutional:  Negative for chills, fever, malaise/fatigue and weight loss.  HENT:  Negative for sore throat.   Eyes:  Negative for redness.  Respiratory:  Negative for cough, shortness of breath and wheezing.   Cardiovascular:  Negative for chest pain, palpitations and leg swelling.  Gastrointestinal:  Positive for diarrhea. Negative for abdominal pain, blood in stool, nausea and vomiting.  Genitourinary:  Negative for dysuria.  Musculoskeletal:  Negative for myalgias.  Skin:  Negative for rash.  Neurological:  Negative for dizziness, tingling and tremors.  Endo/Heme/Allergies:  Does not bruise/bleed easily.  Psychiatric/Behavioral:  Negative for hallucinations.     MEDICAL HISTORY:  Past Medical History:  Diagnosis Date   B12 deficiency 04/21/2017   Cancer (HCC) 2018   Prostate Cancer   Diverticulosis    GERD (gastroesophageal reflux disease)    Glaucoma    Hypercholesteremia     SURGICAL HISTORY: Past Surgical History:  Procedure Laterality Date   COLONOSCOPY     COLONOSCOPY N/A 10/03/2023   Procedure: COLONOSCOPY;  Surgeon: Therisa Bi, MD;  Location: [facility] ENDOSCOPY;  Service: Gastroenterology;  Laterality: N/A;   COLONOSCOPY WITH PROPOFOL  N/A 01/16/2022   Procedure: COLONOSCOPY WITH PROPOFOL ;  Surgeon: Therisa Bi, MD;  Location: [facility] ENDOSCOPY;  Service: Gastroenterology;  Laterality: N/A;   ESOPHAGOGASTRODUODENOSCOPY N/A 01/16/2022   Procedure: ESOPHAGOGASTRODUODENOSCOPY (EGD);  Surgeon: Therisa Bi, MD;  Location: [facility] ENDOSCOPY;  Service: Gastroenterology;  Laterality: N/A;   FOOT SURGERY Right 05/24/2021   HERNIA REPAIR     10 years ago, [facility]. umbilical hernia x 2   PELVIC LYMPH NODE DISSECTION Bilateral 05/27/2017   Procedure: PELVIC LYMPH NODE DISSECTION;  Surgeon: Penne Knee, MD;  Location: [facility] ORS;  Service: Urology;  Laterality: Bilateral;   ROBOT ASSISTED  LAPAROSCOPIC RADICAL PROSTATECTOMY N/A 05/27/2017   Procedure: ROBOTIC ASSISTED LAPAROSCOPIC RADICAL PROSTATECTOMY;  Surgeon: Penne Knee, MD;  Location: [facility] ORS;  Service: Urology;  Laterality: N/A;    SOCIAL HISTORY: Social History   Socioeconomic History   Marital status: Single    Spouse name: Not on file   Number of children: Not on file   Years of education: Not on file   Highest education level: Not on file  Occupational History   Not on file  Tobacco Use   Smoking status: Former    Current packs/day: 0.00    Average packs/day: 0.8 packs/day for 34.0 years (25.5 ttl pk-yrs)    Types: Cigarettes    Start date: 06/15/1984    Quit date: 06/15/2018    Years since quitting: 5.9   Smokeless tobacco: Never  Vaping Use   Vaping status: Never Used  Substance and Sexual Activity   Alcohol use: Yes    Comment: socialy    Drug use: No   Sexual activity: Yes  Other Topics Concern   Not on file  Social History Narrative   Not on file   Social Drivers of Health   Tobacco Use: Medium Risk (05/19/2024)   Patient History    Smoking Tobacco Use: Former    Smokeless Tobacco Use: Never    Passive Exposure: Not on Actuary Strain: Not on file  Food Insecurity: Not on file  Transportation Needs: Not on file  Physical Activity: Not on file  Stress: Not on file  Social Connections: Not on file  Intimate Partner Violence: Not on file  Depression (PHQ2-9): Low Risk (03/24/2024)   Depression (PHQ2-9)    PHQ-2 Score: 0  Alcohol Screen: Low Risk (03/24/2024)   Alcohol Screen    Last Alcohol Screening Score (AUDIT): 2  Housing: Unknown (12/23/2023)   Received from [facility]   Epic    Unable to Pay for Housing in the Last Year: Not on file    Number of Times Moved in the Last Year: Not on file    At any time in the past 12 months, were you homeless or living in a shelter (including now)?: No  Utilities: Not on file  Health Literacy: Not on file     FAMILY HISTORY: Family History  Problem Relation Age of Onset   Diabetes Mother    Breast cancer Mother    Arthritis Father    Breast cancer Sister     ALLERGIES:  is allergic to sulfa antibiotics.  MEDICATIONS:  Current Outpatient Medications  Medication Sig Dispense Refill   cyanocobalamin  (VITAMIN B12) 1000 MCG tablet TAKE 1 TABLET BY MOUTH DAILY 90 tablet 1   diphenoxylate -atropine  (LOMOTIL ) 2.5-0.025 MG tablet Take 2 tablets by mouth 4 (four) times daily as needed for diarrhea or loose stools. 20 tablet 0   latanoprost  (XALATAN ) 0.005 % ophthalmic solution INSTILL 1 DROP IN BOTH EYES AT BEDTIME 5 mL 3   pantoprazole  (PROTONIX ) 40 MG tablet Take 1 tablet (40 mg total) by mouth daily. 90 tablet 3   pravastatin  (PRAVACHOL ) 40 MG tablet Take 1 tablet (40 mg total) by mouth daily. 90 tablet 1   sildenafil  (VIAGRA ) 100 MG tablet Take 1 tablet (100 mg total) by mouth daily as needed for erectile dysfunction. (Patient not taking: Reported on 05/19/2024) 30 tablet 3   XIFAXAN  550 MG TABS tablet Take 1 tablet (550 mg total) by mouth 3 (three) times daily. (Patient not taking: Reported on 05/19/2024) 42 tablet 0   No current facility-administered medications for this visit.     PHYSICAL EXAMINATION: ECOG PERFORMANCE STATUS: 0 - Asymptomatic Vitals:   05/19/24 1327  BP: 106/78  Pulse: 64  Resp: 18  Temp: (!) 96 F (35.6 C)  SpO2: 99%   Filed Weights   05/19/24 1327  Weight: 148 lb 6.4 oz (67.3 kg)    Physical Exam Constitutional:      General: He is not in acute distress.    Appearance: He is not diaphoretic.  HENT:     Head: Normocephalic and atraumatic.     Mouth/Throat:     Pharynx: No oropharyngeal exudate.  Eyes:     General: No scleral icterus.    Pupils: Pupils are equal, round, and reactive to light.  Cardiovascular:     Rate and Rhythm: Normal rate and regular rhythm.     Heart sounds: No murmur heard. Pulmonary:     Effort: Pulmonary effort is normal.  No respiratory distress.     Breath sounds: No rales.  Chest:     Chest wall: No tenderness.  Abdominal:     General: There is no distension.     Palpations: Abdomen is soft.     Tenderness: There is no abdominal tenderness.  Musculoskeletal:        General: Normal range of motion.     Cervical back: Normal range of motion and neck supple.  Skin:    General: Skin  is warm and dry.     Findings: No erythema.  Neurological:     Mental Status: He is alert and oriented to person, place, and time.     Cranial Nerves: No cranial nerve deficit.     Motor: No abnormal muscle tone.     Coordination: Coordination normal.  Psychiatric:        Mood and Affect: Affect normal.      LABORATORY DATA:  I have reviewed the data as listed     Latest Ref Rng & Units 05/19/2024   10:53 AM 05/15/2024    2:57 PM 10/31/2023    9:38 AM  CBC  WBC 4.0 - 10.5 K/uL 6.2  5.4  5.2   Hemoglobin 13.0 - 17.0 g/dL 85.3  84.8  86.0   Hematocrit 39.0 - 52.0 % 44.1  46.0  44.3   Platelets 150 - 400 K/uL 110  128  109       Latest Ref Rng & Units 05/19/2024   10:53 AM 05/15/2024    2:57 PM 10/31/2023    9:38 AM  CMP  Glucose 70 - 99 mg/dL 897  99  93   BUN 8 - 23 mg/dL 11  13  11    Creatinine 0.61 - 1.24 mg/dL 9.01  9.09  9.13   Sodium 135 - 145 mmol/L 139  139  139   Potassium 3.5 - 5.1 mmol/L 3.9  4.1  4.2   Chloride 98 - 111 mmol/L 102  102  103   CO2 22 - 32 mmol/L 27  27  23    Calcium 8.9 - 10.3 mg/dL 9.0  9.4  9.0   Total Protein 6.5 - 8.1 g/dL 7.5  7.6  6.3   Total Bilirubin 0.0 - 1.2 mg/dL 0.5  0.5  0.3   Alkaline Phos 38 - 126 U/L 136  125  143   AST 15 - 41 U/L 23  25  17    ALT 0 - 44 U/L 20  24  18       "

## 2024-05-19 NOTE — Assessment & Plan Note (Signed)
Alcohol cessation was discussed with patient. 

## 2024-05-19 NOTE — Assessment & Plan Note (Signed)
#   Chronic thrombocytopenia:  ITP, with possible ETOH marrow suppression.  Labs are reviewed and discussed with patient. Stable platelet counts. Continue observation

## 2024-06-02 ENCOUNTER — Ambulatory Visit: Payer: PRIVATE HEALTH INSURANCE | Admitting: Nurse Practitioner

## 2024-06-02 ENCOUNTER — Encounter: Payer: Self-pay | Admitting: Nurse Practitioner

## 2024-06-02 VITALS — BP 114/68 | HR 63 | Temp 96.7°F | Resp 16 | Ht 67.0 in | Wt 150.4 lb

## 2024-06-02 DIAGNOSIS — E1165 Type 2 diabetes mellitus with hyperglycemia: Secondary | ICD-10-CM

## 2024-06-02 DIAGNOSIS — I700 Atherosclerosis of aorta: Secondary | ICD-10-CM

## 2024-06-02 DIAGNOSIS — K219 Gastro-esophageal reflux disease without esophagitis: Secondary | ICD-10-CM

## 2024-06-02 DIAGNOSIS — K529 Noninfective gastroenteritis and colitis, unspecified: Secondary | ICD-10-CM

## 2024-06-02 LAB — POCT GLYCOSYLATED HEMOGLOBIN (HGB A1C): Hemoglobin A1C: 5.7 % — AB (ref 4.0–5.6)

## 2024-06-02 MED ORDER — PRAVASTATIN SODIUM 40 MG PO TABS: 40.0000 mg | ORAL_TABLET | Freq: Every day | ORAL | 1 refills | Status: AC

## 2024-06-02 MED ORDER — DIPHENOXYLATE-ATROPINE 2.5-0.025 MG PO TABS: 2.0000 | ORAL_TABLET | Freq: Four times a day (QID) | ORAL | 0 refills | Status: AC | PRN

## 2024-06-02 NOTE — Progress Notes (Signed)
 [facility] [address] [place], [place] [address]  Internal MEDICINE  Office Visit Note  Patient Name: Donald Mendoza  [id number]  [id number]  Date of Service: 06/02/2024  Chief Complaint  Patient presents with   Gastroesophageal Reflux   Hyperlipidemia   Follow-up    HPI Leibish presents for a follow-up visit for diabetes, high cholesterol and GERD. \ Diabetes -- A1c is 5.7 which is stable.  High cholesterol -- taking pravastatin  daily GERD -- on pantoprazole  daily which controls his symptoms. Diarrhea -- having increased diarrhea, waiting for xifaxan  to be approved again because that did help him    Current Medication: Outpatient Encounter Medications as of 06/02/2024  Medication Sig   [DISCONTINUED] XIFAXAN  550 MG TABS tablet Take 1 tablet (550 mg total) by mouth 3 (three) times daily.   CREON 36000-114000 units CPEP capsule Take 36,000 Units by mouth 3 (three) times daily with meals.   cyanocobalamin  (VITAMIN B12) 1000 MCG tablet TAKE 1 TABLET BY MOUTH DAILY   diphenoxylate -atropine  (LOMOTIL ) 2.5-0.025 MG tablet Take 2 tablets by mouth 4 (four) times daily as needed for diarrhea or loose stools.   latanoprost  (XALATAN ) 0.005 % ophthalmic solution INSTILL 1 DROP IN BOTH EYES AT BEDTIME   pantoprazole  (PROTONIX ) 40 MG tablet Take 1 tablet (40 mg total) by mouth daily.   pravastatin  (PRAVACHOL ) 40 MG tablet Take 1 tablet (40 mg total) by mouth daily.   [DISCONTINUED] diphenoxylate -atropine  (LOMOTIL ) 2.5-0.025 MG tablet Take 2 tablets by mouth 4 (four) times daily as needed for diarrhea or loose stools.   [DISCONTINUED] pravastatin  (PRAVACHOL ) 40 MG tablet Take 1 tablet (40 mg total) by mouth daily.   [DISCONTINUED] sildenafil  (VIAGRA ) 100 MG tablet Take 1 tablet (100 mg total) by mouth daily as needed for erectile dysfunction. (Patient not taking: Reported on 05/19/2024)   No facility-administered encounter medications on file as of 06/02/2024.    Surgical  History: Past Surgical History:  Procedure Laterality Date   COLONOSCOPY     COLONOSCOPY N/A 10/03/2023   Procedure: COLONOSCOPY;  Surgeon: Therisa Bi, MD;  Location: [facility] ENDOSCOPY;  Service: Gastroenterology;  Laterality: N/A;   COLONOSCOPY WITH PROPOFOL  N/A 01/16/2022   Procedure: COLONOSCOPY WITH PROPOFOL ;  Surgeon: Therisa Bi, MD;  Location: [facility] ENDOSCOPY;  Service: Gastroenterology;  Laterality: N/A;   ESOPHAGOGASTRODUODENOSCOPY N/A 01/16/2022   Procedure: ESOPHAGOGASTRODUODENOSCOPY (EGD);  Surgeon: Therisa Bi, MD;  Location: [facility] ENDOSCOPY;  Service: Gastroenterology;  Laterality: N/A;   FOOT SURGERY Right 05/24/2021   HERNIA REPAIR     10 years ago, [facility]. umbilical hernia x 2   PELVIC LYMPH NODE DISSECTION Bilateral 05/27/2017   Procedure: PELVIC LYMPH NODE DISSECTION;  Surgeon: Penne Knee, MD;  Location: [facility] ORS;  Service: Urology;  Laterality: Bilateral;   ROBOT ASSISTED LAPAROSCOPIC RADICAL PROSTATECTOMY N/A 05/27/2017   Procedure: ROBOTIC ASSISTED LAPAROSCOPIC RADICAL PROSTATECTOMY;  Surgeon: Penne Knee, MD;  Location: [facility] ORS;  Service: Urology;  Laterality: N/A;    Medical History: Past Medical History:  Diagnosis Date   B12 deficiency 04/21/2017   Cancer (HCC) 2018   Prostate Cancer   Diverticulosis    GERD (gastroesophageal reflux disease)    Glaucoma    Hypercholesteremia     Family History: Family History  Problem Relation Age of Onset   Diabetes Mother    Breast cancer Mother    Arthritis Father    Breast cancer Sister     Social History   Socioeconomic History   Marital status: Single    Spouse  name: Not on file   Number of children: Not on file   Years of education: Not on file   Highest education level: Not on file  Occupational History   Not on file  Tobacco Use   Smoking status: Former    Current packs/day: 0.00    Average packs/day: 0.8 packs/day for 34.0 years (25.5 ttl pk-yrs)    Types: Cigarettes    Start date: 06/15/1984     Quit date: 06/15/2018    Years since quitting: 5.9   Smokeless tobacco: Never  Vaping Use   Vaping status: Never Used  Substance and Sexual Activity   Alcohol use: Yes    Comment: socialy    Drug use: No   Sexual activity: Yes  Other Topics Concern   Not on file  Social History Narrative   Not on file   Social Drivers of Health   Tobacco Use: Medium Risk (06/02/2024)   Patient History    Smoking Tobacco Use: Former    Smokeless Tobacco Use: Never    Passive Exposure: Not on Actuary Strain: Not on file  Food Insecurity: Not on file  Transportation Needs: Not on file  Physical Activity: Not on file  Stress: Not on file  Social Connections: Not on file  Intimate Partner Violence: Not on file  Depression (PHQ2-9): Low Risk (03/24/2024)   Depression (PHQ2-9)    PHQ-2 Score: 0  Alcohol Screen: Low Risk (03/24/2024)   Alcohol Screen    Last Alcohol Screening Score (AUDIT): 2  Housing: Unknown (12/23/2023)   Received from [facility]   Epic    Unable to Pay for Housing in the Last Year: Not on file    Number of Times Moved in the Last Year: Not on file    At any time in the past 12 months, were you homeless or living in a shelter (including now)?: No  Utilities: Not on file  Health Literacy: Not on file      Review of Systems  Constitutional: Negative.  Negative for fatigue.  HENT:  Positive for trouble swallowing (history of this issue before, with no further improvement.).   Respiratory: Negative.  Negative for cough, chest tightness, shortness of breath and wheezing.   Cardiovascular: Negative.  Negative for chest pain and palpitations.  Gastrointestinal:  Positive for diarrhea.       Heartburn  Neurological: Negative.  Negative for headaches.    Vital Signs: BP 114/68   Pulse 63   Temp (!) 96.7 F (35.9 C)   Resp 16   Ht 5' 7 (1.702 m)   Wt 150 lb 6.4 oz (68.2 kg)   SpO2 97%   BMI 23.56 kg/m    Physical Exam Vitals  reviewed.  Constitutional:      General: He is not in acute distress.    Appearance: Normal appearance. He is normal weight. He is not ill-appearing.  HENT:     Head: Normocephalic and atraumatic.  Eyes:     Pupils: Pupils are equal, round, and reactive to light.  Cardiovascular:     Rate and Rhythm: Normal rate and regular rhythm.  Abdominal:     General: Bowel sounds are normal. There is no distension.     Palpations: Abdomen is soft. There is no mass.     Tenderness: There is no abdominal tenderness. There is no guarding or rebound.     Hernia: No hernia is present.  Skin:    General: Skin is  warm and dry.     Capillary Refill: Capillary refill takes less than 2 seconds.  Neurological:     Mental Status: He is alert and oriented to person, place, and time.  Psychiatric:        Mood and Affect: Mood normal.        Behavior: Behavior normal.        Assessment/Plan: 1. Type 2 diabetes mellitus with hyperglycemia, without long-term current use of insulin (HCC) (Primary) A1c is stable at 5.7, continue diabetic diet and physical activity as tolerated.  - POCT glycosylated hemoglobin (Hb A1C)  2. Aortic atherosclerosis Continue pravastatin  as prescribed.  - pravastatin  (PRAVACHOL ) 40 MG tablet; Take 1 tablet (40 mg total) by mouth daily.  Dispense: 90 tablet; Refill: 1  3. Gastroesophageal reflux disease without esophagitis Continue pantoprazole  as prescribed   4. Chronic diarrhea Continue prn lomotil  as prescribed.  - diphenoxylate -atropine  (LOMOTIL ) 2.5-0.025 MG tablet; Take 2 tablets by mouth 4 (four) times daily as needed for diarrhea or loose stools.  Dispense: 20 tablet; Refill: 0   General Counseling: Katai verbalizes understanding of the findings of todays visit and agrees with plan of treatment. I have discussed any further diagnostic evaluation that may be needed or ordered today. We also reviewed his medications today. he has been encouraged to call the office  with any questions or concerns that should arise related to todays visit.    Orders Placed This Encounter  Procedures   POCT glycosylated hemoglobin (Hb A1C)    Meds ordered this encounter  Medications   pravastatin  (PRAVACHOL ) 40 MG tablet    Sig: Take 1 tablet (40 mg total) by mouth daily.    Dispense:  90 tablet    Refill:  1    **Patient requests 90 days supply**   diphenoxylate -atropine  (LOMOTIL ) 2.5-0.025 MG tablet    Sig: Take 2 tablets by mouth 4 (four) times daily as needed for diarrhea or loose stools.    Dispense:  20 tablet    Refill:  0    Return for CPE, Shanyce Daris PCP in june as scheduled .   Total time spent:30 Minutes Time spent includes review of chart, medications, test results, and follow up plan with the patient.   [place] Controlled Substance Database was reviewed by me.  This patient was seen by Mardy Maxin, FNP-C in collaboration with Dr. Sigrid Bathe as a part of collaborative care agreement.   Leta Bucklin R. Maxin, MSN, FNP-C Internal medicine

## 2024-06-14 ENCOUNTER — Other Ambulatory Visit: Payer: Self-pay | Admitting: Nurse Practitioner

## 2024-06-14 DIAGNOSIS — H4010X0 Unspecified open-angle glaucoma, stage unspecified: Secondary | ICD-10-CM

## 2024-10-21 ENCOUNTER — Encounter: Payer: PRIVATE HEALTH INSURANCE | Admitting: Nurse Practitioner

## 2025-02-23 ENCOUNTER — Other Ambulatory Visit: Payer: PRIVATE HEALTH INSURANCE

## 2025-02-25 ENCOUNTER — Ambulatory Visit: Payer: PRIVATE HEALTH INSURANCE | Admitting: Urology

## 2025-05-19 ENCOUNTER — Inpatient Hospital Stay: Payer: PRIVATE HEALTH INSURANCE

## 2025-05-19 ENCOUNTER — Inpatient Hospital Stay: Payer: PRIVATE HEALTH INSURANCE | Admitting: Oncology
